# Patient Record
Sex: Male | Born: 1960 | Race: White | Hispanic: No | Marital: Married | Smoking: Former smoker
Health system: Southern US, Community
[De-identification: ages and names within clinical notes are randomized; demographics above are authoritative.]

## PROBLEM LIST (undated history)

## (undated) DIAGNOSIS — J449 Chronic obstructive pulmonary disease, unspecified: Secondary | ICD-10-CM

## (undated) DIAGNOSIS — K922 Gastrointestinal hemorrhage, unspecified: Secondary | ICD-10-CM

## (undated) DIAGNOSIS — G459 Transient cerebral ischemic attack, unspecified: Secondary | ICD-10-CM

## (undated) DIAGNOSIS — E611 Iron deficiency: Secondary | ICD-10-CM

## (undated) DIAGNOSIS — D869 Sarcoidosis, unspecified: Secondary | ICD-10-CM

## (undated) DIAGNOSIS — D649 Anemia, unspecified: Secondary | ICD-10-CM

## (undated) DIAGNOSIS — K219 Gastro-esophageal reflux disease without esophagitis: Secondary | ICD-10-CM

## (undated) DIAGNOSIS — E162 Hypoglycemia, unspecified: Secondary | ICD-10-CM

## (undated) DIAGNOSIS — I513 Intracardiac thrombosis, not elsewhere classified: Secondary | ICD-10-CM

## (undated) HISTORY — PX: SHOULDER SURGERY: SHX246

## (undated) HISTORY — PX: CARPAL TUNNEL RELEASE: SHX101

## (undated) HISTORY — PX: BACK SURGERY: SHX140

## (undated) HISTORY — PX: ULNAR NERVE REPAIR: SHX2594

## (undated) HISTORY — PX: KNEE SURGERY: SHX244

## (undated) HISTORY — PX: ABDOMINAL SURGERY: SHX537

## (undated) HISTORY — PX: HERNIA REPAIR: SHX51

## (undated) HISTORY — PX: CHOLECYSTECTOMY: SHX55

## (undated) HISTORY — PX: SMALL INTESTINE SURGERY: SHX150

## (undated) HISTORY — PX: CERVICAL FUSION: SHX112

## (undated) HISTORY — PX: GASTRECTOMY: SHX58

---

## 1997-12-20 ENCOUNTER — Emergency Department (HOSPITAL_COMMUNITY): Admission: EM | Admit: 1997-12-20 | Discharge: 1997-12-20 | Payer: Self-pay | Admitting: Emergency Medicine

## 1998-03-19 ENCOUNTER — Emergency Department (HOSPITAL_COMMUNITY): Admission: EM | Admit: 1998-03-19 | Discharge: 1998-03-19 | Payer: Self-pay | Admitting: Emergency Medicine

## 1998-08-25 ENCOUNTER — Emergency Department (HOSPITAL_COMMUNITY): Admission: EM | Admit: 1998-08-25 | Discharge: 1998-08-26 | Payer: Self-pay | Admitting: Emergency Medicine

## 1998-11-19 ENCOUNTER — Emergency Department (HOSPITAL_COMMUNITY): Admission: EM | Admit: 1998-11-19 | Discharge: 1998-11-19 | Payer: Self-pay

## 1999-03-25 ENCOUNTER — Encounter: Payer: Self-pay | Admitting: Emergency Medicine

## 1999-03-25 ENCOUNTER — Emergency Department (HOSPITAL_COMMUNITY): Admission: EM | Admit: 1999-03-25 | Discharge: 1999-03-25 | Payer: Self-pay | Admitting: Emergency Medicine

## 1999-04-21 ENCOUNTER — Emergency Department (HOSPITAL_COMMUNITY): Admission: EM | Admit: 1999-04-21 | Discharge: 1999-04-21 | Payer: Self-pay | Admitting: Emergency Medicine

## 1999-04-21 ENCOUNTER — Encounter: Payer: Self-pay | Admitting: Emergency Medicine

## 1999-05-03 ENCOUNTER — Emergency Department (HOSPITAL_COMMUNITY): Admission: EM | Admit: 1999-05-03 | Discharge: 1999-05-03 | Payer: Self-pay | Admitting: Emergency Medicine

## 1999-05-03 ENCOUNTER — Encounter: Payer: Self-pay | Admitting: Emergency Medicine

## 1999-09-07 ENCOUNTER — Emergency Department (HOSPITAL_COMMUNITY): Admission: EM | Admit: 1999-09-07 | Discharge: 1999-09-07 | Payer: Self-pay | Admitting: Emergency Medicine

## 1999-09-07 ENCOUNTER — Encounter: Payer: Self-pay | Admitting: *Deleted

## 1999-11-01 ENCOUNTER — Emergency Department (HOSPITAL_COMMUNITY): Admission: EM | Admit: 1999-11-01 | Discharge: 1999-11-01 | Payer: Self-pay | Admitting: Emergency Medicine

## 1999-11-01 ENCOUNTER — Encounter: Payer: Self-pay | Admitting: Emergency Medicine

## 2000-02-13 ENCOUNTER — Emergency Department (HOSPITAL_COMMUNITY): Admission: EM | Admit: 2000-02-13 | Discharge: 2000-02-13 | Payer: Self-pay | Admitting: Emergency Medicine

## 2000-03-14 ENCOUNTER — Emergency Department (HOSPITAL_COMMUNITY): Admission: EM | Admit: 2000-03-14 | Discharge: 2000-03-14 | Payer: Self-pay | Admitting: Emergency Medicine

## 2000-03-14 ENCOUNTER — Encounter: Payer: Self-pay | Admitting: Emergency Medicine

## 2000-07-11 ENCOUNTER — Emergency Department (HOSPITAL_COMMUNITY): Admission: EM | Admit: 2000-07-11 | Discharge: 2000-07-11 | Payer: Self-pay | Admitting: Emergency Medicine

## 2000-07-11 ENCOUNTER — Encounter: Payer: Self-pay | Admitting: Emergency Medicine

## 2000-08-01 ENCOUNTER — Emergency Department (HOSPITAL_COMMUNITY): Admission: EM | Admit: 2000-08-01 | Discharge: 2000-08-01 | Payer: Self-pay | Admitting: Emergency Medicine

## 2000-08-09 ENCOUNTER — Emergency Department (HOSPITAL_COMMUNITY): Admission: EM | Admit: 2000-08-09 | Discharge: 2000-08-09 | Payer: Self-pay | Admitting: Emergency Medicine

## 2000-08-23 ENCOUNTER — Emergency Department (HOSPITAL_COMMUNITY): Admission: EM | Admit: 2000-08-23 | Discharge: 2000-08-23 | Payer: Self-pay | Admitting: Emergency Medicine

## 2000-08-23 ENCOUNTER — Encounter: Payer: Self-pay | Admitting: Emergency Medicine

## 2000-08-23 ENCOUNTER — Inpatient Hospital Stay (HOSPITAL_COMMUNITY): Admission: RE | Admit: 2000-08-23 | Discharge: 2000-08-25 | Payer: Self-pay | Admitting: Emergency Medicine

## 2000-08-24 ENCOUNTER — Encounter: Payer: Self-pay | Admitting: Pulmonary Disease

## 2000-09-17 ENCOUNTER — Inpatient Hospital Stay (HOSPITAL_COMMUNITY): Admission: EM | Admit: 2000-09-17 | Discharge: 2000-09-21 | Payer: Self-pay | Admitting: *Deleted

## 2000-11-07 ENCOUNTER — Emergency Department (HOSPITAL_COMMUNITY): Admission: EM | Admit: 2000-11-07 | Discharge: 2000-11-07 | Payer: Self-pay | Admitting: Emergency Medicine

## 2001-01-07 ENCOUNTER — Encounter: Payer: Self-pay | Admitting: Emergency Medicine

## 2001-01-07 ENCOUNTER — Emergency Department (HOSPITAL_COMMUNITY): Admission: EM | Admit: 2001-01-07 | Discharge: 2001-01-07 | Payer: Self-pay | Admitting: Emergency Medicine

## 2003-01-31 ENCOUNTER — Inpatient Hospital Stay (HOSPITAL_COMMUNITY): Admission: EM | Admit: 2003-01-31 | Discharge: 2003-02-10 | Payer: Self-pay | Admitting: Psychiatry

## 2003-02-03 ENCOUNTER — Encounter (HOSPITAL_COMMUNITY): Payer: Self-pay | Admitting: Psychiatry

## 2003-03-17 ENCOUNTER — Encounter: Payer: Self-pay | Admitting: Emergency Medicine

## 2003-03-18 ENCOUNTER — Inpatient Hospital Stay (HOSPITAL_COMMUNITY): Admission: AD | Admit: 2003-03-18 | Discharge: 2003-03-23 | Payer: Self-pay | Admitting: Psychiatry

## 2003-06-28 ENCOUNTER — Inpatient Hospital Stay (HOSPITAL_COMMUNITY): Admission: EM | Admit: 2003-06-28 | Discharge: 2003-07-04 | Payer: Self-pay | Admitting: Psychiatry

## 2004-05-31 ENCOUNTER — Emergency Department (HOSPITAL_COMMUNITY): Admission: EM | Admit: 2004-05-31 | Discharge: 2004-06-01 | Payer: Self-pay | Admitting: Emergency Medicine

## 2004-06-16 ENCOUNTER — Ambulatory Visit: Payer: Self-pay | Admitting: Psychiatry

## 2004-06-16 ENCOUNTER — Inpatient Hospital Stay (HOSPITAL_COMMUNITY): Admission: AD | Admit: 2004-06-16 | Discharge: 2004-06-26 | Payer: Self-pay | Admitting: Psychiatry

## 2004-08-16 ENCOUNTER — Emergency Department (HOSPITAL_COMMUNITY): Admission: EM | Admit: 2004-08-16 | Discharge: 2004-08-16 | Payer: Self-pay | Admitting: Emergency Medicine

## 2004-08-20 ENCOUNTER — Ambulatory Visit: Payer: Self-pay | Admitting: Internal Medicine

## 2004-10-22 ENCOUNTER — Ambulatory Visit (HOSPITAL_COMMUNITY): Admission: RE | Admit: 2004-10-22 | Discharge: 2004-10-22 | Payer: Self-pay | Admitting: *Deleted

## 2004-10-22 ENCOUNTER — Encounter (INDEPENDENT_AMBULATORY_CARE_PROVIDER_SITE_OTHER): Payer: Self-pay | Admitting: *Deleted

## 2005-02-08 ENCOUNTER — Emergency Department (HOSPITAL_COMMUNITY): Admission: EM | Admit: 2005-02-08 | Discharge: 2005-02-08 | Payer: Self-pay | Admitting: Emergency Medicine

## 2005-04-21 ENCOUNTER — Emergency Department (HOSPITAL_COMMUNITY): Admission: EM | Admit: 2005-04-21 | Discharge: 2005-04-21 | Payer: Self-pay | Admitting: Emergency Medicine

## 2005-06-06 ENCOUNTER — Ambulatory Visit: Payer: Self-pay | Admitting: Internal Medicine

## 2005-06-13 ENCOUNTER — Ambulatory Visit (HOSPITAL_COMMUNITY): Admission: RE | Admit: 2005-06-13 | Discharge: 2005-06-13 | Payer: Self-pay | Admitting: Gastroenterology

## 2005-06-19 ENCOUNTER — Ambulatory Visit (HOSPITAL_COMMUNITY): Admission: RE | Admit: 2005-06-19 | Discharge: 2005-06-19 | Payer: Self-pay | Admitting: Gastroenterology

## 2005-07-23 ENCOUNTER — Emergency Department (HOSPITAL_COMMUNITY): Admission: EM | Admit: 2005-07-23 | Discharge: 2005-07-23 | Payer: Self-pay | Admitting: Emergency Medicine

## 2005-08-18 ENCOUNTER — Emergency Department (HOSPITAL_COMMUNITY): Admission: EM | Admit: 2005-08-18 | Discharge: 2005-08-18 | Payer: Self-pay | Admitting: Emergency Medicine

## 2005-08-20 ENCOUNTER — Ambulatory Visit (HOSPITAL_COMMUNITY): Admission: RE | Admit: 2005-08-20 | Discharge: 2005-08-20 | Payer: Self-pay | Admitting: Orthopedic Surgery

## 2005-08-20 ENCOUNTER — Ambulatory Visit: Payer: Self-pay | Admitting: Internal Medicine

## 2005-08-28 ENCOUNTER — Encounter: Admission: RE | Admit: 2005-08-28 | Discharge: 2005-08-28 | Payer: Self-pay | Admitting: Orthopedic Surgery

## 2005-09-08 ENCOUNTER — Emergency Department (HOSPITAL_COMMUNITY): Admission: EM | Admit: 2005-09-08 | Discharge: 2005-09-08 | Payer: Self-pay | Admitting: Emergency Medicine

## 2005-09-10 ENCOUNTER — Ambulatory Visit: Payer: Self-pay | Admitting: Internal Medicine

## 2005-09-16 ENCOUNTER — Ambulatory Visit: Payer: Self-pay | Admitting: Cardiology

## 2005-09-17 ENCOUNTER — Encounter: Payer: Self-pay | Admitting: Cardiology

## 2005-09-17 ENCOUNTER — Inpatient Hospital Stay (HOSPITAL_COMMUNITY): Admission: EM | Admit: 2005-09-17 | Discharge: 2005-09-20 | Payer: Self-pay | Admitting: Emergency Medicine

## 2005-09-30 ENCOUNTER — Encounter: Admission: RE | Admit: 2005-09-30 | Discharge: 2005-12-29 | Payer: Self-pay | Admitting: Internal Medicine

## 2005-10-18 ENCOUNTER — Emergency Department (HOSPITAL_COMMUNITY): Admission: EM | Admit: 2005-10-18 | Discharge: 2005-10-18 | Payer: Self-pay | Admitting: Emergency Medicine

## 2006-04-08 ENCOUNTER — Encounter: Admission: RE | Admit: 2006-04-08 | Discharge: 2006-04-08 | Payer: Self-pay | Admitting: Gastroenterology

## 2006-06-20 ENCOUNTER — Encounter: Admission: RE | Admit: 2006-06-20 | Discharge: 2006-06-20 | Payer: Self-pay | Admitting: Neurosurgery

## 2006-07-15 ENCOUNTER — Encounter: Admission: RE | Admit: 2006-07-15 | Discharge: 2006-07-15 | Payer: Self-pay | Admitting: Gastroenterology

## 2006-10-23 ENCOUNTER — Ambulatory Visit: Payer: Self-pay | Admitting: Physical Medicine and Rehabilitation

## 2006-10-23 ENCOUNTER — Encounter
Admission: RE | Admit: 2006-10-23 | Discharge: 2007-01-21 | Payer: Self-pay | Admitting: Physical Medicine and Rehabilitation

## 2006-12-11 ENCOUNTER — Ambulatory Visit: Payer: Self-pay | Admitting: Physical Medicine and Rehabilitation

## 2007-01-20 ENCOUNTER — Encounter: Admission: RE | Admit: 2007-01-20 | Discharge: 2007-04-20 | Payer: Self-pay | Admitting: Anesthesiology

## 2007-01-26 ENCOUNTER — Ambulatory Visit: Payer: Self-pay | Admitting: Anesthesiology

## 2007-02-24 ENCOUNTER — Encounter
Admission: RE | Admit: 2007-02-24 | Discharge: 2007-02-25 | Payer: Self-pay | Admitting: Physical Medicine and Rehabilitation

## 2007-02-24 ENCOUNTER — Ambulatory Visit: Payer: Self-pay | Admitting: Physical Medicine and Rehabilitation

## 2007-03-08 ENCOUNTER — Encounter (INDEPENDENT_AMBULATORY_CARE_PROVIDER_SITE_OTHER): Payer: Self-pay | Admitting: *Deleted

## 2007-03-08 ENCOUNTER — Inpatient Hospital Stay (HOSPITAL_COMMUNITY): Admission: EM | Admit: 2007-03-08 | Discharge: 2007-03-11 | Payer: Self-pay | Admitting: Emergency Medicine

## 2007-03-10 ENCOUNTER — Ambulatory Visit: Payer: Self-pay | Admitting: Vascular Surgery

## 2007-03-30 ENCOUNTER — Emergency Department (HOSPITAL_COMMUNITY): Admission: EM | Admit: 2007-03-30 | Discharge: 2007-03-31 | Payer: Self-pay | Admitting: Emergency Medicine

## 2007-04-20 ENCOUNTER — Ambulatory Visit: Payer: Self-pay | Admitting: Physical Medicine and Rehabilitation

## 2007-10-02 ENCOUNTER — Emergency Department (HOSPITAL_COMMUNITY): Admission: EM | Admit: 2007-10-02 | Discharge: 2007-10-02 | Payer: Self-pay | Admitting: Emergency Medicine

## 2008-06-07 ENCOUNTER — Encounter
Admission: RE | Admit: 2008-06-07 | Discharge: 2008-06-09 | Payer: Self-pay | Admitting: Physical Medicine and Rehabilitation

## 2008-06-09 ENCOUNTER — Ambulatory Visit: Payer: Self-pay | Admitting: Physical Medicine and Rehabilitation

## 2008-06-22 ENCOUNTER — Encounter
Admission: RE | Admit: 2008-06-22 | Discharge: 2008-08-10 | Payer: Self-pay | Admitting: Physical Medicine and Rehabilitation

## 2008-07-21 ENCOUNTER — Ambulatory Visit: Payer: Self-pay | Admitting: Physical Medicine and Rehabilitation

## 2008-08-14 ENCOUNTER — Encounter: Admission: RE | Admit: 2008-08-14 | Discharge: 2008-08-14 | Payer: Self-pay | Admitting: Neurosurgery

## 2009-01-13 IMAGING — CT CT HEAD W/O CM
1 of 2 series · 16 of 30 positions shown, 20 images · IV contrast (agent unspecified)
Comparison: Head CT 09/16/05.

CLINICAL DATA: Loss of vision for 2 days.  Left temporal headache and left leg weakness.  
 HEAD CT WITHOUT CONTRAST:
TECHNIQUE: Contiguous axial images were obtained from the base of the skull through the vertex according to standard protocol without contrast.

[Series 2: head_seq 4.5 h37s st · axial · 0.43mm/px · z∈[-150,-6]mm · 16 of 36 slices shown, 20 images]
[im 2/36  brain]
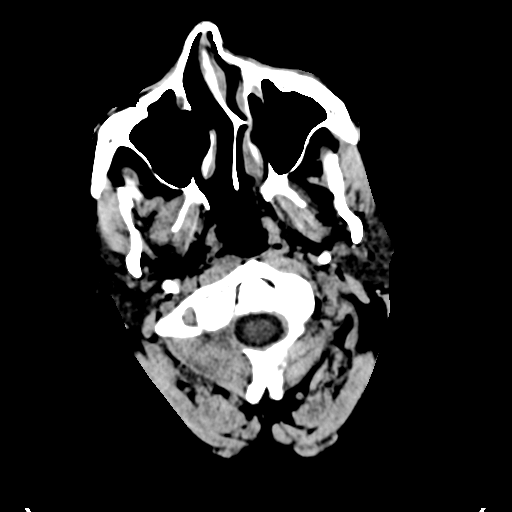
[im 2/36  bone]
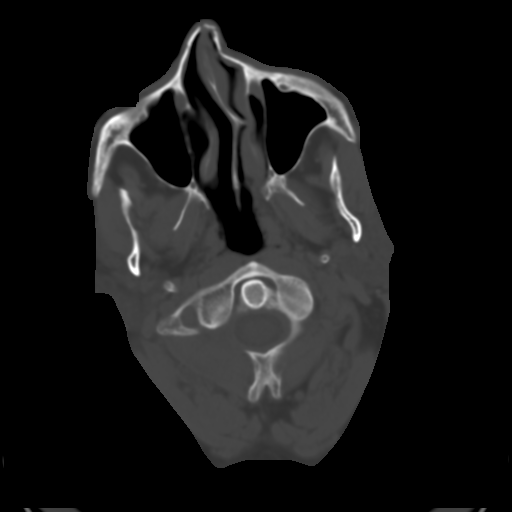
[im 4/36  brain]
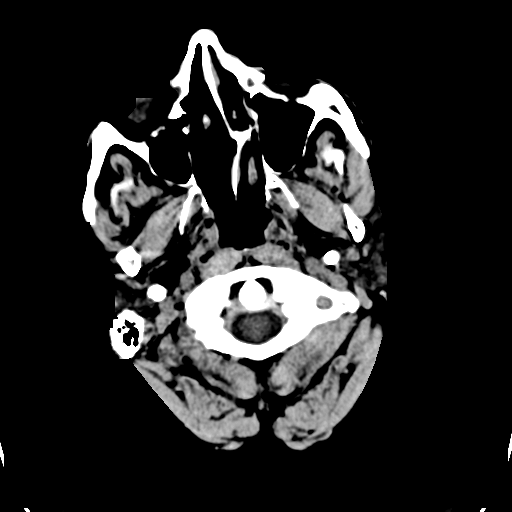
[im 6/36  brain]
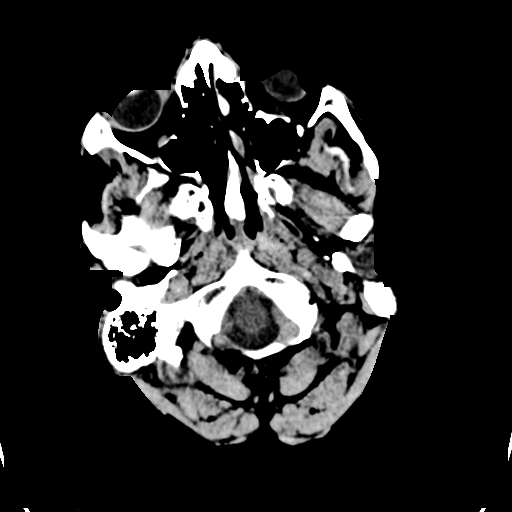
[im 9/36  brain]
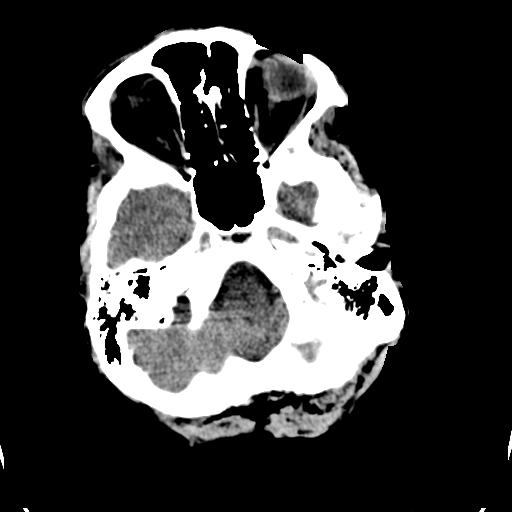
[im 11/36  brain]
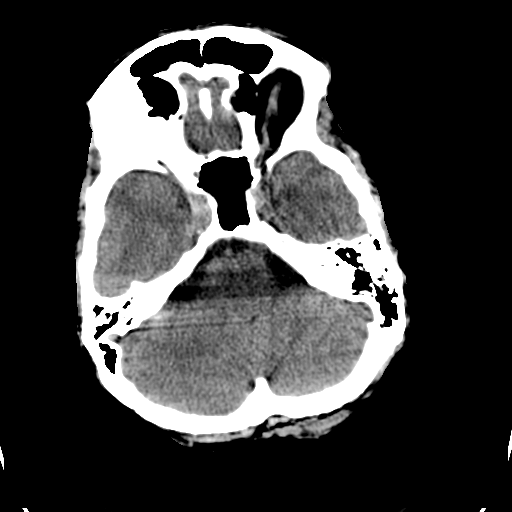
[im 11/36  bone]
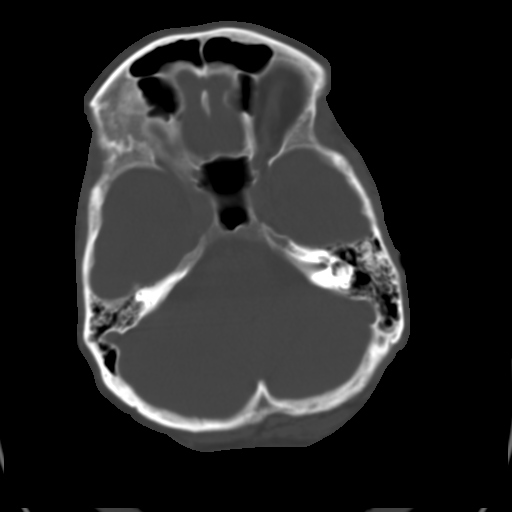
[im 13/36  brain]
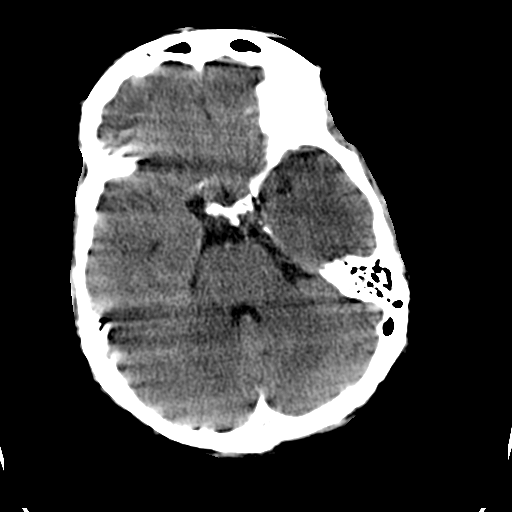
[im 15/36  brain]
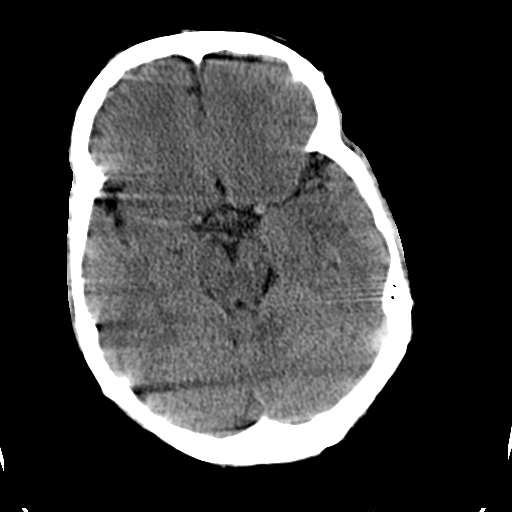
[im 16/36  brain]
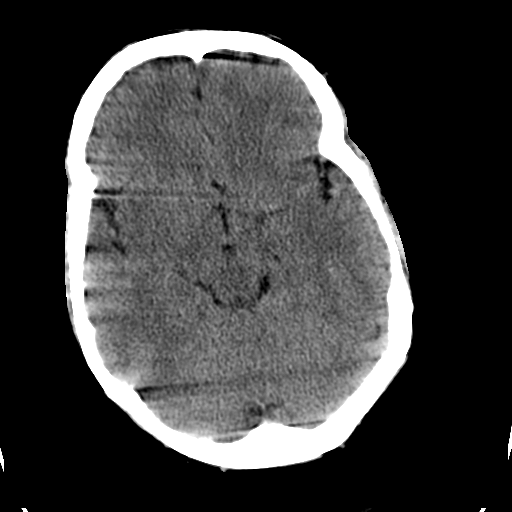
[im 20/36  brain]
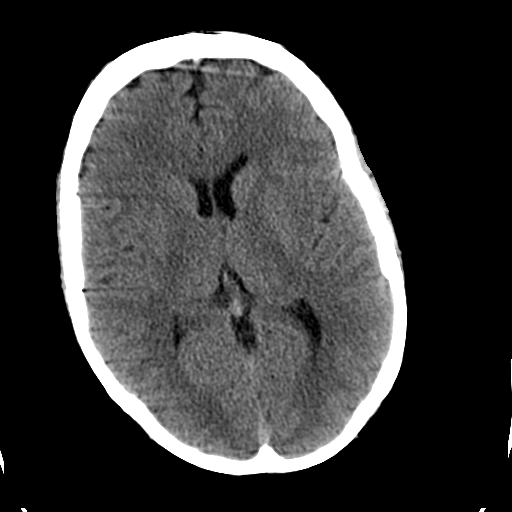
[im 20/36  bone]
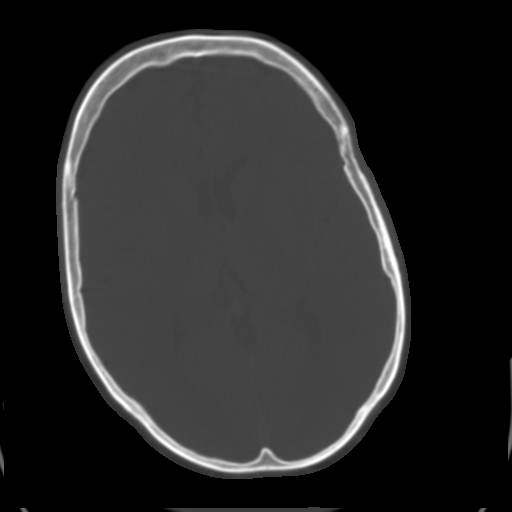
[im 22/36  brain]
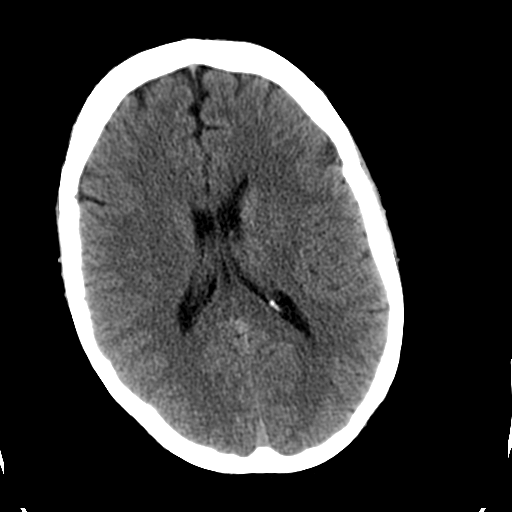
[im 23/36  brain]
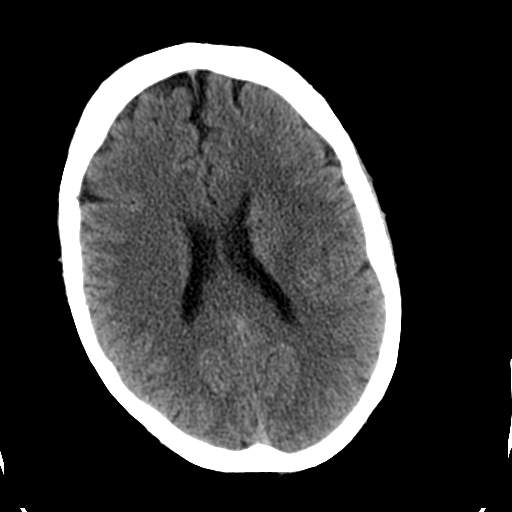
[im 25/36  brain]
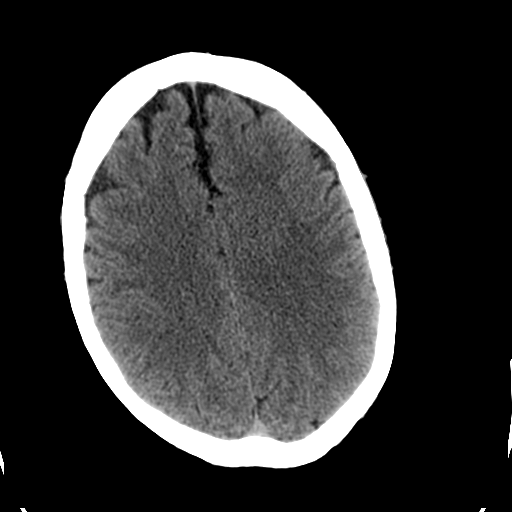
[im 27/36  brain]
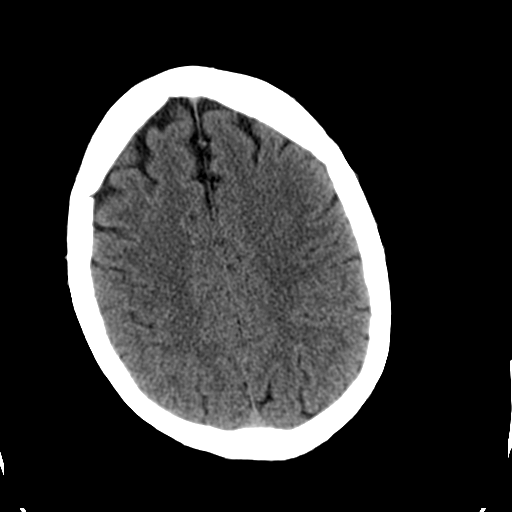
[im 27/36  bone]
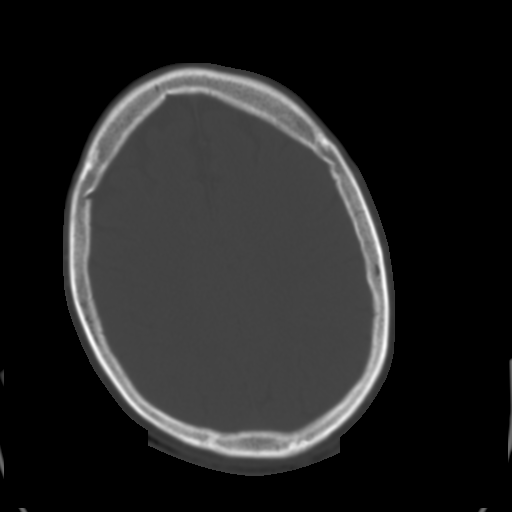
[im 30/36  brain]
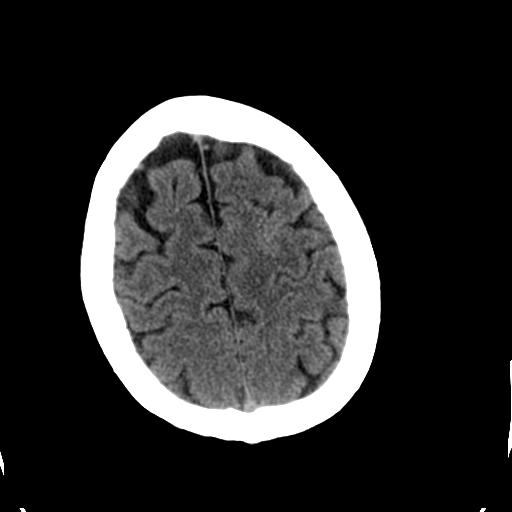
[im 32/36  brain]
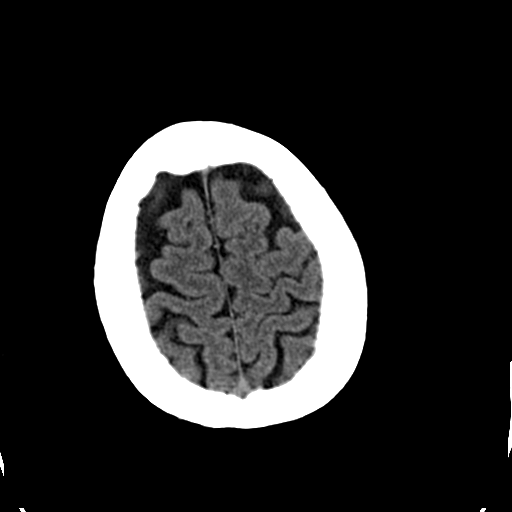
[im 34/36  brain]
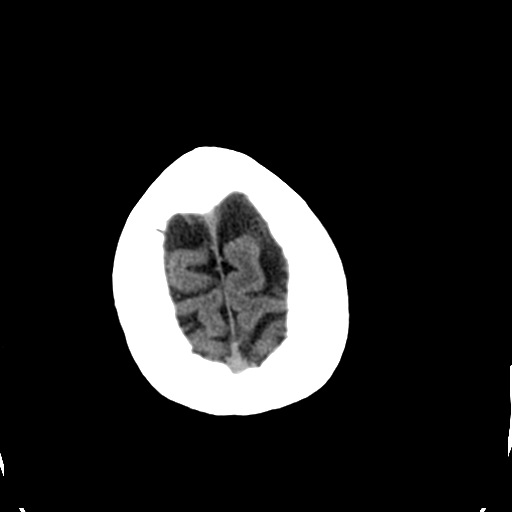

[16 of 30 positions shown; findings below may reference images not displayed]

FINDINGS: Some images were repeated due to motion.  There is no evidence of acute intracranial hemorrhage, mass effect, or extra-axial fluid collection.  The ventricles and subarachnoid spaces appear appropriately sized for age.  No stroke is evident.  There is deformity of the nasal bones suggesting an old fracture.  No acute fractures are visualized.  The visualized paranasal sinuses are now clear.
IMPRESSION: No acute intracranial finding.  There is no CT evidence of acute stroke.  If that remains a clinical concern, follow-up CT or MRI should be considered.

## 2009-06-20 ENCOUNTER — Encounter: Admission: RE | Admit: 2009-06-20 | Discharge: 2009-06-20 | Payer: Self-pay | Admitting: Family Medicine

## 2010-01-21 ENCOUNTER — Encounter: Admission: RE | Admit: 2010-01-21 | Discharge: 2010-01-21 | Payer: Self-pay | Admitting: Neurosurgery

## 2010-07-02 ENCOUNTER — Encounter: Admission: RE | Admit: 2010-07-02 | Discharge: 2010-07-02 | Payer: Self-pay | Admitting: Neurosurgery

## 2010-09-01 ENCOUNTER — Encounter: Payer: Self-pay | Admitting: Gastroenterology

## 2010-12-24 NOTE — Assessment & Plan Note (Signed)
HISTORY:  Mr. Jeffrey Crane is a 50 year old gentleman returning for a recheck.  He has being seen in our pain and rehab clinic for multiple pain  complaints including left knee pain, right ankle pain, cervical lumbar  low back pain, left arm pain with tingling through the dorsum of the  left hand.   He is status post C6-7 fusion 11/2005, Dr. Manson Passey.   CHIEF COMPLAINT:  Cervicalgia and left arm pain.   States his average pain is about 8 on a scale of 10.  He is requesting  cervical injection for this, was told by Dr. Manson Passey to pursue this  apparently.   He recently had his left knee injected and has obtained a knee brace by  Dr. Julius Bowels within the last 2 weeks.   REVIEW OF SYSTEMS:  Positive for coughing.   Past medical, social,  family history otherwise  unchanged.   PHYSICAL EXAMINATION:  VITAL SIGNS:  Blood pressure is 119/76, pulse 90,  respirations 16, a 94% saturated on room air.  GENERAL:  Relative well nourished, well developed male who appears  slightly older than his stated age.  He is oriented x 3.  His affect is  bright, alert, cooperative and pleasant.  His speech is clear.  Follows  commands without difficulty.   Transition from sit to stand without any difficult.  Gait is non-  antalgic.  He has limitation in cervical range of motion with rotation  with left and downward.  Full shoulder range of motion.   He reports numbness into the left upper extremity especially at the  middle ring and little finger on the left although he does report patchy  decrease sensation throughout both upper extremities.   Motor strength however is good in the upper and lower extremities.  No  abnormal tone is noted.  No clonus is noted.   Reflexes are present and 1+ in the upper and lower extremities.   IMPRESSION:  1. Status post cervical fusion C6-7, 11/2005 Dr. Manson Passey with complaints      of cervicalgia and left upper extremity pain and numbness into the      middle ring and little  fingers.  2. Lumbar spondylosis, mild facet arthropathy at multiple levels per      MRI dated 06/20/2006.  3. History of ETOH abuse.  4. History of drug abuse.  5. History of anxiety and depression.   OTHER MEDICAL PROBLEMS:  Include diabetes, chronic obstructive pulmonary  disease, steroid use for chronic obstructive pulmonary disease,  osteopenia, status post gasserectomy 1992.   Patient is using a soft cervical collar for his neck on a p.r.n. basis  although he is not wearing it today.  He has also been on Lyrica and  reports an overall improvement in his pain with 50 mg of Lyrica twice a  day.  He does not need a refill on this today.  He is also using  Lidoderm on a p.r.n. basis.   He is requesting a cervical injection per Dr. Manson Passey.   He reports no problems with the use of Lyrica.  No problems with  sleepiness or drowsiness or trouble walking or balance problems.   He did bring in a myelogram/CT done 09/24/2006 on a disk, we will review  this further.  May consider a followup with Dr. Stevphen Rochester, pain and  anesthesia for injection possible cervical injection.           ______________________________  Brantley Stage, M.D.     DMK/MedQ  D:  01/11/2007 10:11:07  T:  01/11/2007 11:01:37  Job #:  [id number]   cc:   Grant Ruts., D.D.S.  Fax: [phone]

## 2010-12-24 NOTE — Assessment & Plan Note (Signed)
Jeffrey Crane is a 50 year old gentleman returning for recheck and refill of  his medications.  He is accompanied by his wife.  He has multiple pain  complaints, especially complaining of neck and low back pain.  He is  status post a T6-7 fusion April of 2007 by Dr. Manson Passey.   He states that his average pain is about a 9 on a scale of 10.  Sleep  tends to be poor.   Functional status:  Patient able to walk about 30 minutes at a time, he  is independent with his self care.  He admits to some depression/anxiety  and is requesting some medication to assist him with his depression and  to help with pain.   Past medical, social, family history otherwise unchanged.  He follows up  with Dr. Clarene Duke for his general care.   MEDICATIONS PROVIDED BY THIS CLINIC:  1. Lyrica 50 mg two tablets p.o. b.i.d.  2. Lidoderm on a p.r.n. basis.   EXAMINATION:  Blood pressure is 125/61, pulse is 80, respirations 18,  98% saturated on room air.  He is a well-developed, well-nourished  gentleman who appears just slightly older than his stated age.  He is  oriented x3, speech is clear, affect is bright, alert, cooperative, and  pleasant.  Followed commands without difficulty.   Transitions from sit to stand without difficulty.  Tandem gait,  Romberg's tests are performed adequately.  Limitations are noted in  lumbar motion.  Reflexes and motor strength are within normal limits.  Sensory exam is intact.   IMPRESSION:  1. Status post cervical fusion T6-15 November 2005, Dr. Manson Passey.  Patient      has continued to have complaints of cervicalgia, left upper      extremity pain and numbness to the middle and little fingers.  2. Lumbar spondylosis, mild facet arthropathy multiple levels per MRI      dated June 20, 2006.  3. History of EtOH abuse.  4. History of drug abuse.  5. History of anxiety and depression.   OTHER MEDICAL PROBLEMS:  1. Diabetes.  2. Chronic obstructive pulmonary disease.  3. Steroid use for  chronic obstructive pulmonary disease.  4. Osteopenia.  5. Status post gastrectomy 1992 for ulcers.   PLAN:  Will trial patient on Cymbalta starting with 30 mg for the next  14 days, increasing it to 60 mg then will continue Lyrica 50 mg one p.o.  t.i.d. #90 with 2 refills.  Continue to use a soft collar on a p.r.n.  basis.  He uses Biofreeze on a p.r.n. basis as well.  Will trial him  with a TENS unit.   Will see him back in 2 months, nursing visit for refill medications in  one month.          ______________________________  Brantley Stage, M.D.    DMK/MedQ  D:  02/25/2007 13:59:59  T:  02/26/2007 09:41:07  Job #:  [id number]

## 2010-12-24 NOTE — Discharge Summary (Signed)
NAME:  Jeffrey Crane, Jeffrey Crane                 ACCOUNT NO.:  [id number]   MEDICAL RECORD NO.:  [id number]          PATIENT TYPE:  INP   LOCATION:  1439                         FACILITY:  [facility]   PHYSICIAN:  Wilson Singer, M.D.DATE OF BIRTH:  [birth year]   DATE OF ADMISSION:  03/07/2007  DATE OF DISCHARGE:  03/11/2007                               DISCHARGE SUMMARY   FINAL DISCHARGE DIAGNOSIS:  1. Headache with loss of vision, probable migraine headache.  2. Chronic obstructive pulmonary disease.  3. Adrenal insufficiency, on steroids.  4. Diabetes mellitus   CONDITION ON DISCHARGE:  Stable.   MEDICATIONS ON DISCHARGE:  1. Nexium 40 mg daily.  2. Flexeril 10 mg b.i.d..  3. Atrovent and Benylin inhalers.  4. Hydrocortisone 20 mg daily.  5. Spiriva 1 inhalation daily.  6. Advair inhaler 100/50, 1 b.i.d..  7. Lyrica 50 mg t.i.d..  8. Cymbalta 20 mg b.i.d..  9. Depakote 50 mg daily.  10.Aspirin 325 mg daily.  11.Librium 25 mg every h.s..   HISTORY:  This 50 year old man was admitted with sudden episode of loss  of vision bilaterally associated with a headache.  Please see initial  history and physical examination dictated by Michaelyn Barter.   HOSPITAL PROGRESS:  The patient was admitted and he had an MRI of the  brain and this was essentially unremarkable.  Neurology was called to  see him, who felt that this probably was a vascular headache and  represented probable migraine.  There was a suggestion of a carotid  artery dissection but bilateral carotid Dopplers were negative.  The MRI  of the neck showed a filling defect in the right common carotid artery  in its proximal half suspicious for dissection.  As mentioned above, the  bilateral carotid Dopplers were negative.  Echocardiogram was  unremarkable, TSH was within normal limits.  B12 level was normal.  Sed  rate normal/not elevated and ANA was negative.  He always responded to  intravenous Depakote with resolution of headaches.   On the day of  discharge, he was feeling well with significant reduction in his  headaches.   PHYSICAL EXAMINATION:  VITAL SIGNS:  Temperature 97.8, blood pressure  120/76, pulse 81, Saturation 97% on room air. No new neurological  findings.   FURTHER DISPOSITION:  He is to continue with new medications which are  Depakote 500 mg daily as well as Librium 25 mg at night.  He will  continue with he rest of his medications and he has been advised to  followup with his primary care physician and also Dr. Pearlean Brownie,  neurologist, in about 4 weeks' time.      Wilson Singer, M.D.  Electronically Signed     NCG/MEDQ  D:  03/11/2007  T:  03/12/2007  Job:  [id number]   cc:   Caryn Bee L. Little, M.D.  Fax: [phone]

## 2010-12-24 NOTE — H&P (Signed)
NAME:  Jeffrey Crane, Jeffrey Crane NO.:  [id number]   MEDICAL RECORD NO.:  [id number]          PATIENT TYPE:  INP   LOCATION:  0103                         FACILITY:  [facility]   PHYSICIAN:  Michaelyn Barter, M.D. DATE OF BIRTH:  03/18/1961   DATE OF ADMISSION:  03/07/2007  DATE OF DISCHARGE:                              HISTORY & PHYSICAL   PRIMARY CARE PHYSICIAN:  Dr. Clarene Duke, [facility].   CHIEF COMPLAINT:  Sudden loss of vision.   HISTORY OF PRESENT ILLNESS:  Jeffrey Crane is a 50 year old male who is  currently very sleepy.  He arouses, but then drifts suddenly back to  sleep.  Therefore, the history is provided by his wife, Jeffrey Crane.  The  patient's wife indicates that this past Saturday, at approximately 2:30  p.m., the patient had a sudden episode where he lost vision in both of  his eyes.  It took approximately 45 minutes for his vision to return.  He also complained of a headache around the same time.  He later went on  to experience dry heaves prior to the lost of vision.  After his vision  returned, it disappeared again for the second time at approximately 8  p.m., only to return some time later.  The patient's wife indicated in  the past the patient had episodes where his sugars would drop and then  he would lose his vision.  However, this time, his sugars did not drop.  As a matter of fact, the patient's wife indicated that each time his  sugars were greater than 100.  During the second episode of loss of  vision, the patient's CBG was found to be 145.  His blood pressure was  128/88 and heart rate 125.  Like the first time, the patient again  complained of a headache during the second episode of visual loss.  The  patient's wife gave him one to two tablets of Aleve for the headache.  His vision came back approximately 10 minutes later.  The patient's wife  indicates that since the first episode of visual loss, the patient has  experienced approximately 15-16  episodes of visual loss.  Each time, the  patient has a headache and each episode lasts for shorter periods of  time, approximately 2-3 minutes, unlike the first episode.  She states  that she finally decided to bring the patient to the hospital for  further evaluation.  While in the car, the patient became unconscious.  She indicated that she applied some gentle sternal rubs to make sure  that he was okay.  She indicates that when the patient first started  having these visual changes, it started off as him seeing spots.  With  regards to the patient's headaches, she says that anything cold triggers  him to have a headache.  He has never had similar symptoms before other  than when his sugars are low.  However, his sugars are currently normal.  She states the patient was fine before Saturday.  There have been no  fevers or chills.  She states the patient was fine  before Saturday.  There have been no fevers or chills.  He has been diaphoretic and did  have some dry heaves.  The patient drank  eight beers earlier today  prior to his presentation to the hospital.  Since Friday, he has drank  at least six additional beers.   PAST MEDICAL HISTORY:  1. Diabetes mellitus.  2. Adrenal insufficiency leading to the diabetes mellitus.  3. GERD.  4. Chronic pain syndrome.  5. COPD.  6. Low back pain.  7. Peripheral neuropathy.  8. Insomnia.  9. Steroid-induced hyperglycemia.  10.Bone spurs at C6-7.   PAST SURGICAL HISTORY:  1. Disc replaced at C4-5.  2. Left knee arthroscopy completed in January 2008.  3. Eighty-five percent of the patient's stomach was removed secondary      to bleeding ulcers in 1992.  4. Hiatal hernia surgery.  5. Appendectomy.   ALLERGIES:  No known drug allergies.   CURRENT MEDICATIONS:  1. Reglan 10 mg p.o. four times a day.  2. Nexium 40 mg p.o. daily.  3. Hydrocodone 10/650 mg.  4. Tramadol 50 mg.  5. Flexeril 10 mg b.i.d.  6. Albuterol MDI.  7. Albuterol  nebulizer four times a day.  8. Atrovent nebulizer four times a day.  9. Spiriva.  10.Advair.  11.Lyrica 50 mg.  12.Lidoderm patch p.r.n.  13.Hydrocortisone 10 mg daily.  14.NovoLog sliding scale insulin.  15.Trazodone 150 mg nightly p.r.n.  16.Cymbalta.   PHYSICAL EXAMINATION:  GENERAL:  The patient is very sleepy.  He arouses  easily, but falls back to sleep.  VITAL SIGNS:  Temperature 98.6, blood pressure 150/95, heart rate 111,  respirations 20, O2 saturations 95%.  HEENT:  Normocephalic, atraumatic.  Pupils equal round and reactive to  light.  Funduscopic examination of the right eye showed no obvious  retinal hemorrhage.  Funduscopic exam of the left eye showed the pupil  to be constricted and very difficult to get funduscopic examination.  Extraocular movements intact and icteric.  NECK:  No JVD, no lymphadenopathy, no thyromegaly.  CARDIAC:  S1, S2, regular rate and rhythm.  RESPIRATORY:  No crackles or wheezes.  ABDOMEN:  Flat.  Multiple old, well-healed surgical scars are present.  Soft, nontender, nondistended.  Positive bowel sounds.  No organomegaly.  NEUROLOGIC:  The patient is alert and oriented x3.  Cranial nerves 2-12  grossly intact.  There is no facial asymmetry.  Hand/grip strength is  equal bilaterally.  Pronator drift is negative.  No obvious focal motor  deficits with regards to either upper extremities or lower extremities  bilaterally.  MUSCULOSKELETAL:  5/5 upper and lower extremity strength.   LABORATORY DATA AND X-RAY FINDINGS:  White blood cell count 11.9,  hemoglobin 15.3, hematocrit 44.4, platelet count 247,000.  CK-MB 1.-,  troponin less than 0.05.  Sodium 141, potassium 4.1, chloride 106,  carbon dioxide 24, glucose 90, BUN 11, creatinine 0.97, calcium 8.9.  Total protein 6.4, albumin 4, AST 27, ALT 19.  Alcohol level 222.  Erythrocyte sedimentation rate is 2.   CT scan of the patient's head showed no acute intracranial findings,  stable  compared to prior exam.  No CT evidence of acute stroke.   ASSESSMENT/PLAN:  1. Acute onset with sudden loss of vision.  The etiology of this is      questionable.  Differential includes transient ischemic attack      versus migraine headache versus some other unknown etiologic      factor.  Sedimentation rate has been  completed and it is found to      be within normal limits, therefore the likelihood of temporal      arteritis being present is decreased.  Likewise, a CT scan has been      completed and is negative for any acute cerebrovascular accident.      Therefore, will go ahead and check carotid Dopplers as well as a 2-      D echocardiogram as well as RPR and TSH.  May consider consulting      neurology versus ophthalmology.  Will start the patient on aspirin      empirically.  2. Decreased level of responsiveness.  The etiology of this is      questionable.  However, given the fact that the patient has an      alcohol level of greater than 200, I suspect currently the patient      may be suffering from an acute intoxication of alcohol.  It is very      difficult to get a history from the patient secondary to his      current state.  Again, will check a 2-D echocardiogram as well as      EKG for now.  3. Headache.  Again, each time the patient has had episodes of vision      disturbance, there has been the complaint of headache.  Whether or      not this is related to migraine versus transient ischemic attack is      questionable, but will monitor closely for now.  Will consider      consultation with neurology versus ophthalmology.  4. History of diabetes mellitus.  Will initiate Accu-Cheks.  Check a      hemoglobin A1c as well as a fasting lipid profile.  5. History of chronic obstructive pulmonary disease.  This is      compensated.  6. Adrenal insufficiency.  Will verify the dose of the patient's      hydrochlorothiazide and resume this.  7. Gastrointestinal prophylaxis.   Will provide Protonix.  8. Deep venous thrombosis prophylaxis.  Will provide Lovenox.  9. Acute alcohol intoxication.  The patient's wife indicated that over      the past 2 years, the patient has only drank a maximum of 14 beers.      The patient later indicated that he drank 6 beers Friday and 8      beers today.  Will monitor the patient closely now regarding his      history of alcohol consumption.      Michaelyn Barter, M.D.  Electronically Signed     OR/MEDQ  D:  03/08/2007  T:  03/08/2007  Job:  [id number]

## 2010-12-24 NOTE — Procedures (Signed)
NAME:  Jeffrey Crane, Jeffrey Crane NO.:  [id number]   MEDICAL RECORD NO.:  [id number]           PATIENT TYPE:   LOCATION:                                 FACILITY:   PHYSICIAN:  Celene Kras, MD        DATE OF BIRTH:  03-10-1961   DATE OF PROCEDURE:  02/23/2007  DATE OF DISCHARGE:                               OPERATIVE REPORT   1. Elijahjames Fuelling [facility] notably      benefiting from previous cervical facet medial branch intervention.      This is to the left side, and I am going to go ahead and proceed as      a reinforcement, consider radiofrequency neural ablation with      positive predictive experience.  2. Other modifiable features in health profile discussed.  Cigarette      cessation for best outcome.  3. I do not necessarily plan another intervention after this.  I would      like to see how he does.  He has added biomechanical stress above      the surgical fixation site, and I would imagine this is probably      playing into it.  Risks, complications and options are fully      outlined, and we may need either reimage him, or have him see our      surgical colleagues if he does not significantly improve.  Surgical      consultation was recommended to him at some point.  I do not think      that it is a necessarily an emergent to consideration here.  Pain      control is directed toward our best outcome.  4. He is currently not working.  He does have bilateral pain, but I do      not think we need to go too far down the decision tree here.  His      pain is more mechanical than diskogenic.  I do not think nerve      conduction studies would help [place].  Consider muscle stimulator.      Another rationale to perform intervention is to minimize escalation      of controlled substances.   OBJECTIVE:  Diffuse paracervical myofascial, with positive cervical  facetal compression test, particularly left greater than right.  Range  of motion  impaired secondary to pain.  Nothing new neurologically.   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   PLAN:  1. Cervical facet medial branch intervention to the left side at C3,      C4, C5, C6 and C7, with contributory innervation addressed under      local anesthetic, and he is consented.  Predicate further      intervention based on need.  Follow up with our physiatry      colleagues.  2. Review of medication.  3. Review of modifiable features.  4. Consider neurosurgical consult.  He is consented.   The patient  was taken to the fluoroscopy suite and placed in the supine  position.  Neck prepped and draped in the usual fashion.  Under local  anesthetic, I advanced under direct fluoroscopic observation to the  cervical facet at the medial branch of C3, C4, C5, C6 and C7, with  contributory innervation addressed.  Confirmed placement.  I then  injected 0.5 cc of lidocaine 1% MPF at each level, with a total of 40 mg  of Aristocort in divided dose.   He tolerated the procedure well.  No complications from our procedure.  Appropriate recovery.  Discharge instructions given.  No barrier to  communication.  He will be seen in followup.           ______________________________  Celene Kras, MD     HH/MEDQ  D:  02/23/2007 12:23:20  T:  02/24/2007 08:06:53  Job:  [id number]

## 2010-12-24 NOTE — Assessment & Plan Note (Signed)
Jeffrey Crane is a 50 year old married gentleman who was referred by Dr.  Manson Passey for pain management.  Jeffrey Crane has multiple medical problems  including diabetes, renal insufficiency, GERD, COPD, and steroid-induced  hyperglycemia.  He also suffers from history of alcohol abuse, cocaine  abuse, polysubstance abuse in the past and chronic pain issues.   He is back in today and was lasting over a year ago on February 25, 2007.  At that time, he had just undergone medial branch blocks at the cervical  spine by Dr. Stevphen Rochester on February 23, 2007, and January 26, 2007, without  significant improvement.   After that apparently, he underwent another cervical spine surgery by  Dr. Manson Passey and had some improvement in his overall neck pain.  The  cervical spine surgery was about one year ago.  He had C4-C5 fusion back  in 2007 and C3-C4 fusion back in the fall of 2008.   He has also undergone recently right carpal tunnel surgery and right  ulnar surgery and is planning another surgery on the left carpal tunnel  on June 15, 2008, in the next week again by Dr. Manson Passey.   He is also followed by Dr. Julius Bowels who did an arthroscopic surgery on  his right knee.  Apparently, he had a torn meniscus, which was cleaned  up.  I do not have any notes regarding this particular surgery.  This is  for per Jeffrey Crane's history.  He has set up for an MRI of this  knees well.   His chief complaints today are cervical and lumbar pain, which is  intermittent, really depends on his activity level, bilateral knees  bother him, right knee worse than the left.  Pain is about 8 on a scale  of 10, described as aching, stabbing, sharp, and interfering  significantly with activity level.   Pain is fairly constant throughout the day and night.  Sleep tends to be  poor.   He had been on Lyrica 3 times a day.  He is no longer taking this.   He does have Lidoderm, which he induces intermittently.   FUNCTIONAL STATUS:  He is able to  walk 10-15 minutes at a time.  He is  able to climb stairs and drive.  He is independent with self care, also  helps out cleaning the house.  He enjoys playing on the computer as  well.   REVIEW OF SYSTEMS:  The patient denies problems with controlling bowel  or bladder, but occasionally has some numbness, tingling, and weakness  in the upper extremities and right lower extremities.  Denies confusion,  depression, anxiety, or suicidal ideations at this time.   He admits with some shortness of breath and continues to smoke about  half pack cigarettes a day.  He states he recently had a swine flu,  which made him quit sick a month or two ago.   Physicians currently involved in his care include Randle Shatzer who is  over at [facility], Gwendlyn Deutscher, neurosurgeon, and Heloise Beecham, his orthopedist.   PAST MEDICAL HISTORY:  Significant for diabetes mellitus, renal  insufficiency, gastroesophageal reflux, chronic pain syndrome, COPD,  peripheral neuropathy, insomnia, steroid-induced hyperglycemia, history  of alcohol abuse, polysubstance abuse, suicidal ideations, and  sarcoidosis.   PAST SURGICAL HISTORY:  Positive for bleeding ulcer in 1992 with a  partial gastrectomy, appendectomy, cervical spine surgery x2 by Dr.  Gwendlyn Deutscher, lumbar surgery in 2008 by Dr. Manson Passey, right knee  arthroscopic surgery by Dr. Julius Bowels, and left knee arthroscopic surgery  in 2009 by Dr. Julius Bowels.   SOCIAL HISTORY:  The patient is married.  Lives with his wife.  He has a  history of DUI and polysubstance abuse and alcohol abuse.  Continue to  smoke half pack of cigarettes a day, caution against this.   FAMILY HISTORY:  No changes in his family history.  History of diabetes  is noted.   MEDICATIONS:  He comes in on today include the following, Advair,  Reglan, Flexeril, Hydrocet, trazodone, Nexium, Spiriva, albuterol, and  Atrovent.   PHYSICAL EXAMINATION:  VITAL SIGNS:  Blood pressure is  124/81, pulse 73,  respirations 18, and 98% saturation on room air.  GENERAL:  He is well-developed, well-nourished gentleman who appears a  bit older than his stated age.  He is oriented x3.  Speech is clear.  Affect is bright.  He is alert, cooperative, and pleasant, and he  follows commands without difficulty.   Cranial nerves grossly intact.  Coordination is intact.  Reflexes are 2+  in the upper extremities at biceps, triceps, and brachioradialis, 2+ in  patellar, and Achilles tendons are zero bilaterally.  Light touch motor  strength is 5/5 in upper and lower extremities about obvious focal  weakness.  He has fair range of motion in cervical spine.  Full shoulder  range motion is noted as well.  He has minimal limitations in lumbar  motion with forward flexion, slightly flattened back in the lumbar  spine.   Gait is nonantalgic today with normal stride length.  Balance is quite  good.   IMPRESSION:  1. Status post cervical spine surgery x2 by Dr. Gwendlyn Deutscher.  2. History of lumbar surgery in 2008.  3. Bilateral knee osteoarthritis, status post arthroscopic surgery in      2008 and 2009 with chronic knee pain.  4. Lumbago.  5. Cervicalgia.  6. History of chronic pain.  7. History of EtOH abuse.  8. History of drug abuse.  9. History of anxiety/depression.   PLAN:  We will get Jeffrey Crane setup for some physical therapy, education,  appropriate body mechanics for cervical and lumbar spine.  TENS unit  trial.  He does have a soft collar at home.  He had a lumbar support at  home, which he can use on a p.r.n. basis.  I have also given him some  samples of Biofreeze.   Apparently, he does have a surgery coming up by Dr. Manson Passey for left  carpal tunnel on June 15, 2008.  I will see him back in 6 weeks.           ______________________________  Brantley Stage, M.D.     DMK/MedQ  D:  06/09/2008 10:13:48  T:  06/10/2008 00:11:17  Job #:  [id number]

## 2010-12-24 NOTE — Procedures (Signed)
NAME:  Jeffrey Crane NO.:  [id number]   MEDICAL RECORD NO.:  [id number]          PATIENT TYPE:  REC   LOCATION:  TPC                          FACILITY:  [facility]   PHYSICIAN:  Celene Kras, MD        DATE OF BIRTH:  16-Oct-1960   DATE OF PROCEDURE:  01/26/2007  DATE OF DISCHARGE:                               OPERATIVE REPORT   [facility]  I evaluated  and reviewed the history form and 14 point review of systems.  He is  complaining of lateralizing pain to the left side, functional impairment  secondary to pain.  An individual who has extensive degenerative disease  of the cervical spine with ACDF plate.  We plan today to go on to  cervical facet medial branch intervention to minimize escalation of  controlled substances and improve function and quality of life.   1. Modifiable features in health profile such as cigarette cessation      is strongly urged.   1. We plan to go on the most problematic side.  I do not believe a      bilateral intervention is warranted at this time.  He is an      individual who has had many struggles and we want to avoid      controlled substances if possible, and we are going to go ahead and      proceed with the left side.  The risks, complications and options      are fully outlined and he is consented.  I am going to go to C4, C5      and C6 with contributory innervation addressed, and predicate      further intervention based on need. He is consented for this      procedure and will follow up with him in about 3-4 weeks.   Physical exam reveals a pleasant male seen comfortably in bed.  Gait,  affect, and appearance normal.  Alert and oriented x3.  He has diffuse  suprascapular paracervical myofascial, positive cervical compression  test left greater than right.  Range of motion impaired secondary to  pain.  Nothing new neurologically.   IMPRESSION:  Cervical facet syndrome,  degenerative disc disease cervical  spine.   PLAN:  1. Cervical facet medial branch intervention to the left side C4, C5,      C6 and C7. Contributory innervation addressed under local      anesthetic.  Predicate further intervention based on need.  2. Modifiable features in the health profile discussed.  3. Maintain contact with our colleagues, rehab, for the biomechanical      model.  He is consented.   The patient was taken to the fluoroscopy suite and placed in the supine  position.  After prep and drape in the usual fashion, I used a 25 gauge  needle and advanced to the cervical facet medial branch on the left side  C4, C5, C6 and C7, contributory innervation addressed.  Confirmed  placement.  I then inject 0.5 mL  lidocaine 1% MPF at each level with  total of 40 mg Aristocort in divided dose.   He tolerated the procedure well.  No complications from the procedure.  Appropriate recovery.  Discharge instructions given.  No barrier to  communication.  I will see him in follow-up.  Improved.           ______________________________  Celene Kras, MD     HH/MEDQ  D:  01/26/2007 13:33:49  T:  01/26/2007 04:54:09  Job:  [id number]

## 2010-12-24 NOTE — Assessment & Plan Note (Signed)
Jeffrey Crane is a 50 year old married gentleman who is accompanied by his  wife today.  He is a patient of Dr. Gwendlyn Deutscher and Dr. Clarene Duke.  Mr.  Hockey has multiple medical problems including diabetes, history of renal  insufficiency, GERD, COPD, and steroid-induced hyperglycemia.  He also  suffers from a history of alcohol abuse, cocaine abuse, polysubstance  abuse in the past, and chronic pain issues.   He was last seen by me in June 09, 2008.  In the interim, he reports  no new medical problems.  He has had bilateral carpal tunnel surgery;  right was done in October 2009, left was done in November 2009; both by  Dr. Gwendlyn Deutscher.   He reports overall improvement in the hand numbness and tingling with  these surgeries.   Complains today mainly of cervicalgia and some mid thoracic back pain.   Average pain is about an 8 on a scale of 10, described as sharp,  burning, dull, stabbing, tingling, and aching; fairly constant in  nature.  He has had no change in the quality or intensity of the pain in  the last 6-8 months; it has been fairly constant without exacerbation.   Interferes significantly with activities, sleep tends to be poor.   Pain is worse when he is active, walking, bending, sitting, and  standing; improves with medication.   MEDICATIONS:  Provided through this clinic none at this time.  He has  been trialed on Lidoderm patches, Lyrica 50 mg t.i.d., and Cymbalta 60  mg daily.  He has discontinued all of these medications.   Functional status is as follows.  He is able to walk 10 minutes or less.  He is able to climb stairs and drive.  He is independent with self-care.  He has been disabled since 68.   Denies problems controlling bowel or bladder.  His numbness in the hand  started since his surgery.  Denies depression, anxiety, or suicidal  ideations.   REVIEW OF SYSTEMS:  Otherwise negative.   SOCIAL HISTORY:  The patient continues to live with wife and 2  sons, 71  and 71 year old in the home; denies illegal substance use; denies  alcohol; smokes a pack of cigarettes a day.   PHYSICAL EXAMINATION:  VITAL SIGNS:  Blood pressure 144/88, pulse 54,  respiration 18, and 98% saturated on room air.  GENERAL:  Jeffrey Crane is a well-developed, well-nourished gentleman who  does not appear in any distress.  He is smiling and appropriate this  morning.  He is oriented x3.  Speech is clear.  Affect is bright.  He is  alert, cooperative, and pleasant.   He answers questions appropriately and follows commands without  difficulty.   Cranial nerves and coordination are grossly intact.  Reflexes are 1+ in  the upper extremities, 2+ at bilateral knees, and 0 at the ankles.  No  abnormal tone is noted.  No clonus is noted.  No tremors are noted.   Denies sensory deficits in the upper and lower extremities to light  touch today.  Motor strength is quite good in both upper and lower  extremities without obvious focal deficits.  Straight leg raise is  negative.   Mild limitations noted in the cervical range of motion in all planes  also mild limitations noted in the lumbar range of motion as well.  Hip  range of motion is within normal limits.  Shoulder range of motion is  within normal limits bilaterally as well.  Gait is nonantalgic with a normal stride length, balance is good.  Tandem gait and Romberg tests are all performed adequately.   IMPRESSION:  1. Status post cervical spine surgery x2, Dr. Gwendlyn Deutscher.  2. History of lumbar surgery 2008.  3. Bilateral knee osteoarthritis, status post arthroscopic surgery      2008 and 2009 with chronic knee pain.  4. Lumbago.  5. Cervicalgia.  6. History of chronic pain.  7. History of EtOH abuse.  8. History of drug abuse.  9. History of anxiety and depression.   PLAN:  We will give him a prescription for Flexeril 10 mg 1 p.o. at  bedtime p.r.n.  He has been off Lyrica and Cymbalta now for many  months.  He is not interested in these 2 medications, does not find that they  helped.  He has found that the lidocaine does not help that much either.  He has been trialed on TENS unit, states that this does not help as  well.  He is requesting some narcotic pain medication.  Given his  history, we will decline this request currently.   We will see him back in 3 months.  I asked him to maintain contact with  his primary care physician for his other medical problems.           ______________________________  Brantley Stage, M.D.     DMK/MedQ  D:  07/21/2008 09:50:10  T:  07/21/2008 16:10:96  Job #:  [id number]

## 2010-12-24 NOTE — Consult Note (Signed)
NAME:  Jeffrey Crane, Jeffrey Crane                 ACCOUNT NO.:  [id number]   MEDICAL RECORD NO.:  [id number]          PATIENT TYPE:  INP   LOCATION:  1439                         FACILITY:  [facility]   PHYSICIAN:  Pramod P. Pearlean Brownie, MD    DATE OF BIRTH:  11-03-1960   DATE OF CONSULTATION:  DATE OF DISCHARGE:                                 CONSULTATION   REFERRING PHYSICIAN:  In [facility] Team.   REASON FOR REFERRAL:  Headache and vision loss.   HISTORY OF PRESENT ILLNESS:  Jeffrey Crane is a 50 year old Caucasian male  who presented for evaluation for a 1-week history of dizzy headaches as  well as transient vision loss episodes.  The patient states he has been  having headaches every day for the last 1 week and describes them as  bitemporal, sharp headaches, which last for a variable period of time  from 7 minutes to a few hours.  Headaches are accompanied with some  nausea and light sensitivity but no sound sensitivity.  Headaches are  disabling, 10 out of 10.  He needs to lie down.  In the past he has had  similar headaches of around once or twice a month but without visual  disturbances.  He used to take Aleve which worked quite well.  For the  last 2 days he has had several episodes, at least 4-5 of transient  vision loss, describes them as becoming blind in both eyes all of a  sudden, seeing everything dark, lasting for a variable period of time  from 3 minutes to a maximum of 30 minutes.  He had 2-3 episodes  yesterday.  He states in the past he has had similar episodes when his  sugars had been quite low but in the present episodes his sugars were  normal to high range.  On one of the episodes he checked his blood  glucose to be 145.  He admits to having drank about 14 beers over the  weekend and these episodes.  He denies any nausea, vomiting, vertigo,  speech problems, gait or balance problems.  He was seen in the emergency  room with an alcohol level of 200.  He has had no further episodes  since  he has been in the hospital.   PAST MEDICAL HISTORY:  Significant for:  1. Diabetes.  2. Renal insufficiency.  3. Gastroesophageal reflux disease.  4. Chronic pain syndrome.  5. COPD.  6. Low back pain.  7. Peripheral neuropathy.  8. Insomnia.  9. Steroid-induced hyperglycemia.   PAST SURGICAL HISTORY:  1. Cervical disk surgery C4-5.  2. Left knee arthroscopy.  3. Gastrectomy.  4. Appendectomy.   MEDICATION LIST:  Aspirin, albuterol, Effexor, Flexeril, Cymbalta,  Lovenox, Advair, hydrocortisone, Atrovent, Reglan, insulin, Protonix,  Lyrica, Spiriva.   MEDICATION ALLERGIES:  1. TYLENOL CAUSES RASH.  2. NSAIDS CAUSE BLEEDING ULCER.   REVIEW OF SYSTEMS:  Positive for generalized weakness, headache, vision  loss.   PHYSICAL EXAMINATION:  GENERAL:  Reveals a frail middle-aged Caucasian  male who at present is not in distress.  VITAL SIGNS:  Afebrile.  Temperature 98.1, pulse rate 69 per minute  regular, respiratory rate 18 per minute , blood pressure 114/70, sat is  95% on room air.  His CBG today is 124.  EXTREMITIES:  Distal pulses are well felt.  HEAD:  Nontraumatic.  NECK:  Supple without bruit.  ENT:  Unremarkable.  CARDIAC:  No murmur or gallop.  LUNGS:  Clear to auscultation.  ABDOMEN:  Soft, nontender.  NEUROLOGIC:  He is pleasant, awake, alert, cooperative, with no aphasia,  apraxia, or dysarthria.  Pupils are equal, reactive.  There is no  afferent pupillary defect.  Visual acuity and fields accurate. .  Funduscopic exam reveals sharp disk margins without papilledema.  There  is a small papillary hemorrhage noted in the right eye.  Face is  symmetric.  Palatal movements are normal.  Tongue is midline.  Motor  system exam reveals no upper extremity drift.  Symmetric strength, tone,  reflexes, coordination, sensation.  He has depressed ankle jerks and  some impaired vibration of the toes but he walks with a fairly steady  gait.  He is in fact able to  stand on either foot unsupported with eyes  open and closed and even walk tandem.   DATA REVIEW:  Non-contrast CAT scan of the head done today reveals no  acute abnormality.   LABORATORY:  WBC count is normal.   IMPRESSION:  A 50 year old gentleman with a 1-week history of daily  recurrent headaches as well as transient vision loss of unclear  significance.  Possibilities include complicated migraine episodes  versus alcohol related vision loss spells.  Given his prior history of  gastrectomy, certainly B12 and folate deficiencies also need to be  checked  for.   PLAN:  1. I would recommend treating his headaches with IV Depacon 750 mg x1      now, followed by Depakote ER 500 mg a day for headache prophylaxis.  2. Check MRI scan of the brain and MRA of the brain to rule out      __________ or vascular lesions.  3. Check ESR, ANA, B12, folate, and methylmalonic acid levels.  4. The patient should cut back his alcohol intake and smoking.   I would be happy to follow the patient in consult, kindly call for  questions.           ______________________________  Sunny Schlein. Pearlean Brownie, MD     PPS/MEDQ  D:  03/08/2007  T:  03/08/2007  Job:  [id number]

## 2010-12-27 NOTE — Discharge Summary (Signed)
NAME:  Jeffrey Crane, SCHWANDT NO.:  [id number]   MEDICAL RECORD NO.:  [id number]                   PATIENT TYPE:  IPS   LOCATION:  0304                                 [facility]ACILITY:  [facility]   PHYSICIAN:  Jeanice Lim, M.D.              DATE OF BIRTH:  1961/02/28   DATE OF ADMISSION:  06/28/2003  DATE OF DISCHARGE:  07/04/2003                                 DISCHARGE SUMMARY   IDENTIFYING DATA:  This is a 50 year old Caucasian male voluntarily  admitted.  Fourth admission to [facility].  Admitted from  [place].  Presented with decreased sleep.  Took an overdose of  Lexapro, Seroquel.  Had been depressed for three weeks with suicidal  ideation.  Drinking a case of beer daily for two weeks.  Longest period of  sobriety was one year in 2002.  Use of cocaine a week ago and cannabis two  days ago.  The patient had been on no medications for depression for a  month.   MEDICATIONS:  Combivent inhaler, Seroquel, Lexapro 15 mg daily in the past  and Wellbutrin XL 300 mg daily in the past.   ALLERGIES:  No known drug allergies.   PHYSICAL EXAMINATION:  Essentially within normal limits.  Neurologically  nonfocal.   LABORATORY DATA:  Routine admission labs within normal limits.   MENTAL STATUS EXAM:  Fully alert, cooperative, depressed, sad affect.  Ashamed of drinking.  Mood depressed, guilty, helpless, hopeless.  Thought  process goal directed.  Positive passive suicidal ideation.  No psychotic  symptoms.  Cognitively intact.  Feeling unable to control substance abuse.  Helpless regarding addiction.   ADMISSION DATE:   AXIS I:  1. Major depressive disorder, recurrent, severe.  2. Alcohol dependence.  3. Polysubstance abuse.   AXIS II:  Deferred.   AXIS III:  1. 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  2. Emphysema.   AXIS IV:  Moderate (problems with primary support group, medical problems  and problems related to  addiction and depression).   AXIS V:  25/55-60.   HOSPITAL COURSE:  The patient was admitted and ordered routine p.r.n.  medications and placed in dual-diagnosis program.  Encouraged to participate  in individual, group and milieu therapy.  Was monitored for safety and  restabilized on medications.  The patient reported a positive response to  crisis intervention and medication changes.  Reported a decrease in  withdrawal symptoms and gradually improved with crisis interventions.   CONDITION ON DISCHARGE:  Markedly improved.  Mood more euthymic.  Affect  brighter.  Thought processes goal directed.  Thought content negative for  dangerous ideation or psychotic symptoms.   DISCHARGE MEDICATIONS:  1. Protonix 40 mg q.a.m.  2. Combivent inhaler 2 puffs four times a day.  3. Lexapro 10 mg q.a.m.  4. Seroquel 200 mg q.h.s.   FOLLOW UP:  The patient was to  follow up on Wednesday, July 05, 2003  with Dr. Lang Snow and at [facility]  on July 04, 2003 at 2 p.m.   DISCHARGE DIAGNOSES:   AXIS I:  1. Major depressive disorder, recurrent, severe.  2. Alcohol dependence.  3. Polysubstance abuse.   AXIS II:  Deferred.   AXIS III:  1. 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  2. Emphysema.   AXIS IV:  Moderate (problems with primary support group, medical problems  and problems related to addiction and depression).   AXIS V:  Global Assessment of Functioning on discharge 55.                                               Jeanice Lim, M.D.    JEM/MEDQ  D:  07/27/2003  T:  07/27/2003  Job:  [id number]

## 2010-12-27 NOTE — H&P (Signed)
NAME:  Jeffrey Crane, Jeffrey Crane NO.:  [id number]   MEDICAL RECORD NO.:  [id number]                   PATIENT TYPE:  IPS   LOCATION:  0304                                 FACILITY:  [facility]   PHYSICIAN:  Geoffery Lyons, M.D.                   DATE OF BIRTH:  November 05, 1960   DATE OF ADMISSION:  06/28/2003  DATE OF DISCHARGE:                         PSYCHIATRIC ADMISSION ASSESSMENT   DATE OF ASSESSMENT:  June 28, 2003   PATIENT IDENTIFICATION:  This is a 50 year old divorced white male who is a  voluntary admission.   HISTORY OF PRESENT ILLNESS:  This is the fourth admission to [facility] for this 50 year old white male from [place], [place], who presented in the emergency room on November 16  after taking a bunch, approximately 10-20 tablets each of Lexapro,  Seroquel, and Wellbutrin on November 15.  His urine drug screen was negative  and his alcohol level was at 188.  He reported he had been drinking about  one case of beer daily for the past two weeks, had a history of alcohol  abuse and his longest period clean and sober had been approximately one year  in 2002.  He also endorsed abuse of cocaine with his last use approximately  one week ago and he also used marijuana approximately two days ago.  He  denied any suicidal ideation today, homicidal ideation, or auditory and  visual hallucinations.  He reported he has had no medications in about one  month and he stated that he stopped them because of financial problems  obtaining the medications.  He endorsed depressed mood for the past three  weeks with suicidal ideation when under the influence of alcohol and he  reported his sleep had been decreased to about two hours a night with  frequent awakenings.  In the emergency room, he had endorsed suicidal  ideation, stating he had no reason to live and that he had been quite  depressed, stating, I have no life since my dad  died.  His father had been  deceased several months.   PAST PSYCHIATRIC HISTORY:  The patient is followed at [facility] but has not been attending appointments.  This is his fourth  admission to [facility] with his last one being in  September 2004.  He states he went home and finished his medications but  then did not keep appointments or continue the medications as prescribed.  This is a typical presentation for this patient.   SUBSTANCE ABUSE HISTORY:  Please see the above.  The patient has a history  of alcohol and cocaine abuse.   PAST MEDICAL HISTORY:  The patient is followed at [facility] for primary  care needs.  He attends there irregularly.  Medical problems include asthma  and emphysema.  Past medical history is remarkable  for a hiatal hernia,  gastrectomy secondary to GERD and a history of pyloric ulcer disease,  previous hernia repair, and a questionable past diagnosis of sarcoidosis.  The patient has no history of seizures.   MEDICATIONS:  It is noted that the patient has had no medications in the  past month but typically takes:  1. Combivent inhaler two puffs q.i.d.  2. He was previously discharged on Seroquel 100 mg p.o. q.h.s.  3. Lexapro 15 mg daily.  4. Wellbutrin XL 300 mg p.o. q.a.m.   DRUG ALLERGIES:  None.   PHYSICAL EXAMINATION:  GENERAL:  Please see the physical examination done by  Dr. Jed Limerick in the [facility] Emergency Room.  We note no real significant  differences at this time.  This is a generally well nourished, well  developed, small built male.  The patient does wear corrective lenses.  VITAL SIGNS:  Vital signs were within normal limits at the time of admission  including a pulse of 98 and blood pressure 135/85.  He was afebrile.  He was  5 feet 7 inches tall, weighed 176 pounds with a BMI calculated at  approximately 28.  LUNGS:  Lung sounds revealed rare scattered wheezes with few coarse   bronchial rattles.  HEART:  Heart rate was regular with S1 and S2 heard.  EXTREMITIES:  Extremities were noted as pink and warm, no signs of edema.  NEUROLOGIC:  Generally normal motor exam with no signs of tremor.   LABORATORY DATA:  Alcohol level was 188 in the emergency room.  Urine drug  screen was negative.  Pulse oximetry 98%.  Basic metabolic panel and liver  panel were within normal limits.  He did receive thiamine and folate in the  emergency room along with IV fluids.   SOCIAL HISTORY:  This patient is disabled.  He is divorced, no children.  He  is on both Medicare and Medicaid, lives at home with his mother and brother.  No current legal problems.   FAMILY HISTORY:  The patient denies any history of mental illness or  substance abuse.   MENTAL STATUS EXAM:  This is a fully alert male who is cooperative with a  depressed and sad affect.  He is ashamed of his drinking, able to articulate  his concerns about his depression with his father quite well.  Speech is  normal in pace and tone.  Mood is depressed, ashamed, and helpless.  Thought  process is positive for some passive suicidal ideation, feeling hopeless and  helpless, no homicidal ideation, no clear psychosis.  Cognitive: Intact and  oriented x 3.  Thought content is remarkable for somewhat passive approach  to his substance abuse, acting somewhat helpless in the face of it, stating,  It just happens.  Intelligence is average.  Insight is adequate.  Impulse  control and judgment: Within normal limits.   ADMISSION DIAGNOSES:   AXIS I:  1. Major depression, recurrent, severe.  2. Alcohol abuse and dependence.  3. Polysubstance abuse.   AXIS II:  Deferred.   AXIS III:  1. Status post gastrectomy secondary to pyloric ulcer disease.  2. Emphysema.   AXIS IV:  Moderate problems with increased idle time but adequate support  from his mother with whom he lives is an asset to him.  AXIS V:  Current 29, past year  28.   INITIAL PLAN OF CARE:  Plan is to voluntarily admit the patient to detoxify  him from alcohol.  We have placed  him on a Librium detoxification protocol  and will check a Burn's score and a CIWA on him.  We have placed him in a  dual diagnosis programming and he is participating in intensive group and  individual psychotherapy appropriately.  We are going to restart him on his  Lexapro 10 mg daily along with Seroquel at h.s. and will continue to  evaluate his need for the Wellbutrin.  We are also placing him on Protonix  40 mg daily for some GI protection and his  Combivent inhaler, two puffs q.i.d.  We have discussed the plan with the  patient.  He has asked some appropriate questions and has voiced his  agreement with the plan.   ESTIMATED LENGTH OF STAY:  Five days.     Margaret A. Scott, N.P.                   Geoffery Lyons, M.D.    MAS/MEDQ  D:  07/03/2003  T:  07/03/2003  Job:  [id number]

## 2010-12-27 NOTE — Discharge Summary (Signed)
[facility]  Patient:    Jeffrey Crane, Jeffrey Crane                        MRN: [id number] Adm. Date:  [id number] Disc. Date: [id number] Attending:  Devoria Albe Dictator:   Young Berry. Lorin Picket, R.N., F.N.P.                           Discharge Summary  IDENTIFYING INFORMATION:  This is a 50 year old separated white male admitted on an voluntary basis with a chief complaint that he was here for "alcohol, drugs, and suicide."  HISTORY OF PRESENT ILLNESS:  The patient presented to the [facility] emergency Department.  Initially at that time he was feeling depressed with severe thoughts of suicide including a plan to either jump off a bridge or hang himself and reporting feeling extremely suicidal.  The patient stated he wanted to straighten out his life and had been drinking a 12 pack of beer prior to going to [facility].  He has been drinking nonstop for at least a year anywhere from a 12 pack of beer up to a case and a half every day for the last year with his last drink being one day prior to presenting in the emergency department.  He reported no history of DTs, no history of seizures, but did have a history of blackouts.  Also reported that his sleep was poor, averaging about two hours a night for the previous six years with frequent awakening. He does state that trazodone did assist him with sleep.  Appetite: Reportedly poor with a weight loss of 15 pounds in two weeks.  The patient also reported using crack cocaine about $200 worth every weekend for the last six months with his last use two weeks prior to presentation.  The patient feels that he has been depressed since his father died approximately one year prior and has been feeling extremely hopeless and helpless, also worried about his mother whose health is poor.  The patient states that he was just ready to give up on his life.  PAST PSYCHIATRIC HISTORY:  The patient had attempted suicide at least one time prior in  the past by trying to hang himself.  That was approximately 15 years prior to admission.  He had been in [facility] on occasion five years prior to admission for alcohol abuse and depression and at [facility] approximately five years earlier for alcohol abuse and depression.  The patient reported he currently sees a Dr. Judithann Graves, a private psychiatric in [facility], and had been seen one week previous.  Also stated he is seeing at [facility] in [place] by Dr. Gwyndolyn Kaufman and had been seen by her three weeks prior.  SUBSTANCE ABUSE HISTORY:  The patient reported heavy alcohol use for the past 15 years and use of crack cocaine for six months.  He also smokes two to three packs of cigarettes a day and reports drinking enormous amounts of caffeine.  PAST MEDICAL HISTORY:  The patient reports that he had been seen in the [facility] Emergency Room three weeks prior to this admission and being hospitalized at [facility] for a blood clot in his lungs, stating that his symptoms around this episode involved sharp chest pain and vomiting of blood.  The patient also reported that he had a 85% gastrectomy in 1993 secondary to bleeding ulcers.  Also reported a  history of two CVAs two years ago.  The patient reported that he has been advised not to take aspirin; also that he has emphysema and experiences shortness of breath at times.  Medications at the time of admission: The patient reports that he is supposed to be taking Coumadin in amount unknown every day at 12 noon and that he has not been taking this for the past two days.  The patient also reports that he is to be taking Zoloft 200 mg q.a.m., trazodone 200 mg q.h.s., Prevacid 30 mg p.o. q.a.m., and Ventolin two puffs q.6h. p.r.n. for shortness of breath and that he uses oxygen 2 L per minute at night only while he is sleeping for his emphysema.  The patient reports no known drug allergies.  SOCIAL  HISTORY:  The patient has been separated for the past 10 years.  He lives with his mother in [place].  He has completed the 10th grade, is on disability due to his stomach problems.  Family history was remarkable for a father with a history of alcohol abuse.  PHYSICAL EXAMINATION:  Done at the [facility] Emergency Department immediately prior to admission.  The patient at that time was given Lovenox 120 mg subcutaneously.  Blood alcohol level was noted to be 215 at that time.  Urine drug screen was negative.  CBC with differential was within normal limits with the exception of ABS lymphs elevated at 3.5.  INR was 1.0, activated partial thromboplastin time was 29 seconds.  Temperature was not obtained on admission; however pulse was 83, respirations 20, blood pressure 121/83. Height 5 feet 6 inches, weight 175 pounds.  The emergency room noted that there seemed to be some question regarding if this male was actually treated with a pulmonary embolism and Dr. Kriste Basque was called at that time and stated that he has not seen this patient in some time.  MENTAL STATUS EXAMINATION:  Short, white male casually dressed who smelled of alcohol, tremulous in his up extremities with some diaphoresis.  Mood was sad and tearful.  Affect: Depressed.  Currently having suicidal ideation with intent, denied any homicidal ideation or intent.  Thought process showed no evidence of psychosis, no hallucinations, no delusions.  No signs and symptoms of withdrawal with the exception of tremors and diaphoresis.  Cognitive: Alert and oriented.  Average intelligence.  Cognitive function appears to be intact at this time.  Insight is fair.  Judgment is poor.  Impulse control: Poor.  ADMITTING DIAGNOSES: Axis I:    1. Major depression, recurrent with suicidal ideation and intent.            2. Alcohol dependence.            3. Crack cocaine abuse. Axis II:   Deferred. Axis III:  1. Emphysema.            2.  Sarcoidosis with shortness of breath.            3. History of pulmonary embolism three weeks by the patient report.            4. History of cerebrovascular accident two years ago.            5. Status post partial gastrectomy 1993             6. Reported history of bleeding ulcers. Axis IV:   Severe problems related to his primary support group, social            environment, substance  abuse, and medical problems. Axis V:    Current global assessment of functioning 30, highest past year 65.  PLAN:  Admit the patient on for detoxification off alcohol.  We elected to start him on a high dose Librium protocol and to continue the medications that he had been taking from mental health that included his Zoloft 200 mg p.o. q.a.m., Prevacid 30 mg p.o. q.a.m., trazodone 200 mg p.o. q.h.s., and Ventolin two puffs q.2h. for shortness of breath.  We also did start him back on O2 at 2 L per minute to use through the night.  Dr. Kriste Basque was contacted regarding this patients report of pulmonary embolism and use of Coumadin.  Dr. Kriste Basque indicated that he was unaware the patient had been hospitalized previous for a pulmonary embolism and Dr. Kriste Basque agreed to review his old records.  It was elected at that time to use the pharmacy protocol for anticoagulation while we awaited the records from [facility], put the patient on vital signs q.i.d. and began monitoring him for shortness of breath and chest pain.  As the patient progressed through detoxification, he noted that he was still having some problems with racing thoughts; therefore, we elected to add Seroquel to his regime, specifically Seroquel 25 mg every h.s.  We requested an internal medicine consult from Dr. Posey Rea and they reviewed his previous [facility] records from his hospitalization January 13 to the January 15.  At that time, it was noted that CT of his chest was negative for a pulmonary embolism and Dr. Posey Rea reported that the patient  was not treated with Coumadin and also that his coagulations were normal when he was treated in the emergency room at [facility] about two weeks prior to this admission.  It was Dr. Adah Perl opinion that the Lovenox provided in the emergency room for this admission was probably and empirical treatment based on the patients history.  Dr. Adah Perl impressions were: 1. The patient did not have a pulmonary embolism in January and had not been anticoagulated, 2. 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 Posey Rea recommended albuterol multidose inhaler q.i.d. and decrease the patients smoking; also pointed out that Dr. Kriste Basque had previously recommended that the patient follow up with [facility] after his discharge in January.  Based on this consult, we discontinued any plans for Coumadin and discontinued blood draws for INR daily.  It was noted on February 9 that the patients detoxification was progressing well with no evidence of major or minor withdrawal symptoms.  The patient at that time denied any suicidal concerns.  His affect was full.  He was sleeping and eating well.  On February 10, the patient reported feeling much improved, categorically denied any suicidal concerns and arrangements were made to admit to the [facility] in [place] by his case Production designer, theatre/television/film.  On February 11, the patient was feeling prepared for discharge, denied any plan for self-harm, no suicidal ideation or intent.  The patient was doing well, had tolerated detoxification well, and it was determined that he was prepared to be discharged and [facility] agreed to take him.  The patient was discharged to the [facility] and he was discharged February 11.  DISCHARGE MEDICATIONS: 1. Seroquel 25 mg one at h.s. 2. Albuterol inhaler two puffs four times a day. 3. Zoloft 100 mg two tablets every day. 4. Trazodone 200 mg at bed time. 5. Prevacid 30 mg q.a.m.  FOLLOWUP:  Between 42  noon and 2 p.m. on the day of discharge at [facility] in [place].  DISCHARGE DIAGNOSES: Axis I:    1. Major depression, recurrent, severe.            2. Alcohol dependence.            3. Crack cocaine abuse. Axis II:   Deferred. Axis III:  1. Emphysema.            2. Sarcoidosis.            3. Status post partial gastrectomy in 1993.            4. History of cerebrovascular accident two years ago. Axis IV:   Mild related to the social environment and medical problems. Axis V:    Current global assessment of functioning 55, past year 65. DD:  10/28/00 TD:  10/28/00 Job: [id number]

## 2010-12-27 NOTE — Assessment & Plan Note (Signed)
Jeffrey Crane is a 50 year old married gentleman who is accompanied by his  wife this morning to our pain and rehabilitative clinic.  He has a chief  complaint of left lower extremity pain.   He has had left knee arthroscopic surgery in January of 2008.  He has  had an MRI back in November of 2007.   He states his average pain is about a 6 on a scale of 10.   Pain is worse when he is up standing or laying flat.  Improves when he  is sitting or laying curled.  His wife states that he sleeps curled  around a pillow at night.  He reports overall poor sleep.  He has been  on a 50 mg dose of Lyrica one time a day to try to control his leg pain.  He reports it did not help.  He has also been on hydrocodone.  He states  that this did not help as well.  He brings in a prescription of  hydrocodone dated to September of 2007.  He has 26-1/2 pills of this  left.   Pain is described as fairly constant but waxes and wanes in intensity.  The pain is described as sharp, burning, and stabbing in nature, worse  with activity, improves with rest and medication.   He reports pain is localized to the left lower extremity.  Most of the  pain is into the anterior shin region, and he also has some numbness of  the great toe.  He also had some right upper extremity pain which is not  his main complaint today.  He states his pain in the upper extremities  is about a 5 on a scale of 10.  While the left leg is about an 8 on a  scale of 10, low back is between 1 and 2 on a scale of 10.   He is able to walk about 30-40 minutes at a time, he is able to climb  stairs, he is able to drive.  He is independent with his self care  including bathing, meal prep.   He is currently not working.  He stopped working in 1992.  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   He denies any problems controlling bowel or bladder.  He admits to some  depression and anxiety.   Denies suicidal ideation.   He reports weight loss, diarrhea, abdominal pain, and shortness of  breath on health and history form review of systems.   PAST MEDICAL HISTORY:  1. 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      He has been on steroids apparently for his COPD.  2. He also carries a diagnosis of sarcoidosis.   PAST SURGICAL HISTORY:  1. Positive for appendectomy in the 80s.  2. He states he has had approximately 85% of his stomach removed in      1992.  3. History of hiatal hernia.  4. He has undergone cervical spine surgery at [facility] by Dr. Manson Passey.   He also underwent left knee arthroscopic surgery in January of 2008.   Jeffrey Crane has a history of alcoholism, 20 years drinking up to 2 cases  of beer a day.  Also admits to crack cocaine use.  He has been followed  by Dr.  Wynonia Lawman and Dr. Betti Cruz, and has been on multiple medications in  the past.  Has smoked up  to 4 packs of cigarettes a day down to one pack  a day.  He has not drank alcohol nor used crack for approximately one  year now.  He does have a history of 2 DUIs as well.   FAMILY HISTORY:  Positive for heart disease, diabetes, lung disease.   MEDICATIONS:  1. Advair 250/50 one daily.  2. Tramadol 50 mg 1-2 up to three times a day.  3. Reglan 10 mg up to four times a day.  4. Flexeril 10 mg up to three times a day.  5. Hydrocort 20 mg daily.  6. Lipitor 20 mg daily.  7. Trazodone 150 q. evening.  8. Hydrocodone/acetaminophen 10/650 up to four a day per Dr. Manson Passey.  9. Nexium 40 mg daily.  10.Spiriva 1 tablet daily.  11.Albuterol 2 puffs four times a day.  12.Albuterol nebulizers.  13.Atrovent 0.5 three times a day.  14.NovoLog.   The patient brings in an MRI report dated June 20, 2006 which showed  overall mild spondylosis of the lumbar spine with mild central canal  narrowing due to bulging disk and ligamentum flavum thickening at L4-5,  as well as facet arthropathy at multiple  levels.   EXAMINATION:  Blood pressure is 116/79, pulse 69, respirations 16, he is  96% saturated on room air.  He is well-developed, well-nourished  gentleman who appears older than his stated age.   He is oriented x3, his speech is clear, his affect is alert, he is  cooperative and pleasant.  He follows commands without difficulty.   He does shift a bit during his interview with me while he is in the  chair.  He stands independently.  His gait displays a shorter stride  length on the left compared to right.  He has limitations in lumbar  range of motion with flexion and extension.  Seated his reflexes are 2+  at the patellar tendons, 1+ at the Achilles tendons.  He has decreased  sensation along the left L5 dermatome.  Motor strength however is quite  good.  No focal weakness is appreciated.  Straight leg raise is  negative.  No tenderness with palpation around the left knee.  Internal/external rotation of the left hip and right hip did not  exacerbate his pain.   Balance was evaluated with Romberg test.  He performs this adequately.  He does have some difficulty with tandem gait because he has pain with  weightbearing through the left lower extremity right now making tandem  gait difficult for him.   IMPRESSION:  1. Left lower extremity pain with numbness.  2. Status post anterior cervical fusion [facility], April 2007, Dr. Manson Passey.  3. History of EtOH abuse.  4. History of drug abuse, currently states has not used drugs for      approximately one year.  5. History of anxiety.  6. Medical problems include diabetes, chronic obstructive pulmonary      disease, sarcoidosis, chronic steroid use, osteopenia, and 85% of      stomach removed in 1992.   PLAN:  Would like to obtain other studies which apparently Dr. Manson Passey has  ordered.  I believe there may be a CT myelogram which is not attached to the chart today.  Will go ahead and trial Jeffrey Crane on Lyrica again,  however will increase  doses up to 150 a day.  We will see him back in a  month.  Obtain UDS as well.  ______________________________  Brantley Stage, M.D.     DMK/MedQ  D:  10/26/2006 12:50:16  T:  10/26/2006 17:13:08  Job #:  [id number]

## 2010-12-27 NOTE — Discharge Summary (Signed)
NAME:  Jeffrey Crane, Jeffrey Crane                 ACCOUNT NO.:  [id number]   MEDICAL RECORD NO.:  [id number]          PATIENT TYPE:  INP   LOCATION:  1431                         FACILITY:  [facility]   PHYSICIAN:  [name], MD  DATE OF BIRTH:  07/05/1961   DATE OF ADMISSION:  09/16/2005  DATE OF DISCHARGE:  09/20/2005                                 DISCHARGE SUMMARY   CHIEF COMPLAINT ON ADMISSION:  Altered mental status.   DISCHARGE DIAGNOSES:  1.  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  Please note that on      admission the patient was found to heave altered mental status with      diaphoresis, shaking, and pallor.  Blood sugar was checked and found to      be 40.  He was given glucose as well as an amp of D50 and started on      oral glucose supplementation.  His symptoms resolved after sugar      repletion, and he was brought to the emergency room where I was to admit      him for further workup of hypoglycemia.  Of note, the patient did not      carry diagnosis of diabetes; however, his wife relates episodes of      hypoglycemia in the past which at times are related to postprandial      situations.  Of note, the patient also has lost significant amount of      weight over the past couple of months to a year.  The patient was noted      to have been on and off steroid medication for COPD for the last 6      months and recently was discharged from [facility] including a      prednisone taper on the day of his symptoms.  Cosyntropin stimulation      test and general testing of morning cortisol revealed a starting      cortisol level of 0.2 to 0.3 with no stimulation.  His cortisol were 7      to 8.  The patient, therefore, qualifies as an adrenal insufficient      patient.  He, therefore, was started on hydrocortisone replacement.  It      will be recommended that he obtain an endocrinologist of his choice to      follow up with in the next 1 to 2 weeks.  2.   Gastroesophageal reflux disease with previous gastrectomy secondary to      peptic ulcer disease and hiatal hernia repair resulting in restricted      esophageal outlet.  The patient was maintained on Reglan after a brief      period of discontinuation.  It was thought on admission that the Reglan      might be contributing to hypoglycemia, but it became clear that this was      adrenal insufficiency, and, therefore, Reglan was restarted.  Of note      also on Reglan, the patient becomes quite distended and early satiety.  At the time of discharge, he has resumed Reglan and has no symptoms of      early satiety, nausea, vomiting, or vomiting.  In fact, the patient      reports that he is not able to vomit because of his previous surgery.  3.  Chronic obstructive pulmonary disease.  During the course of this      hospital stay, chronic obstructive pulmonary disease has been quite      compensated.  Will continue him on albuterol nebulizers as needed and      his inhalers, Advair and Spiriva.  He can follow up with his      pulmonologist as needed.  4.  Lower back pain. 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 We have continued him on      Lyrica.  5.  Peripheral neuropathy.  This is also treated with Lyrica.  6.  Insomnia.  The patient has resumed trazodone with good effect.  7.  Hyperglycemia secondary to steroids.  The most recent CBGs have been      moderately elevated.  I will only be asking him to use a modified      sliding scale for blood sugars greater than 150.  For a blood sugar of      151 to 200, he will take 2 units of NovoLog.  For 201 to 250, he will      take 3.  For 251 to 300, he will take 5.  For 301 to 350, he will take      7.  For greater than 350, he will take 9 and call his doctor.  8.  Tobacco abuse.  This appears to be ongoing.  He has been counseled      against this at bad effect.  9.  Recent  lower extremity swelling.  The patient's primary care physician      had been appropriately scheduling a cardiac workup for recent lower      extremity edema.  The 2-D echocardiogram was completed in hospital to      evaluate for possible cause of this as well as confirm that there is no      vegetative disease causing endocarditis which could have presented as      occult hyperglycemia.  Prior to receiving the results of a morning      cortisol, which was extremely low, a differential diagnosis of possible      occult infection and endocarditis was entertained.  The patient had      recently had cat scratch to the back of his hand from a cat at home but      no evidence of systemic disease other than hypoglycemic event.  Blood      cultures were obtained.  These were negative.  A 2-D echocardiogram was      obtained which showed a normal ejection fraction and no vegetation.   DISCHARGE MEDICATIONS:  1.  Spiriva 18 mcg inhaled daily.  2.  Trazodone 150 mg p.o. nightly.  3.  Lyrica 50 mg nightly.  4.  Advair 250/50 one puff inhaled twice daily.  5.  Reglan 10 mg before each meal and at bedtime.  6.  Hydrocortisone 10 mg at 8 p.m., hydrocortisone 20 mg at 8 a.m.  7.  Albuterol MDI 2 puffs 4 times daily as needed.  8.  Albuterol 2.5 mg nebulizer  every 6 hours as needed.  9.  Nexium 40 mg. This has been increased to twice daily because of the      steroids.  10. I will be leaving a sliding scale insulin, and the patient and his wife      will be instructed on administration.  The sliding scale is as stated.      Blood sugar less than 60, he should take no insulin. For 60 to 100, no      insulin.  For blood sugar 101 to 150, 2 units of insulin should be      utilized; 201 to 250, 3 units; 251 to 300, 5 units; 301 to 350, 7 units;      greater than 350, 9 units and call doctor.   HISTORY OF PRESENT ILLNESS:  The patient is a very pleasant 50 year old gentleman who recently was treated at  [facility] for COPD  exacerbation. He presented to the hospital with altered mental status,  diaphoresis, treated at home initially by his wife with some peanut butter.  The patient was astute enough to recognize a low blood sugar which he has  had in the past, but when he continued to have altered mental status, had  him brought to the emergency room.  EMS checked the patient and found that  his blood sugar was 40s to 50s.  He was given an amp of D50 with significant  improvement in his mental status.  In the emergency room, he was noted to be  alert and oriented x3.  The patient had no other complaints, and we were  asked to admit because of the new onset of hypoglycemia.   In the emergency room initially, the patient was noted to have cat scratches  on the back of his hand. The differential diagnosis, therefore, for occult  hypoglycemic causes included occult infection.  The blood cultures were sent  out.  A 2-D echocardiogram was ordered and completed showing no obvious  vegetations or cardiomyopathy.  Initially the patient's knowledge of  hypoglycemia and his wife's knowledge of hypoglycemia was felt to be  extensive, out of proportion to the usual patient.  Therefore, specific  testing was sent for factitious secondary causes.   Analyzing the patient's recent history and obtaining further information  suggesting that the patient had significant exposure to steroids for most of  the last year, it was plausible that he, indeed, was suffering more from  adrenal insufficiency than any other cause for hypoglycemia.  A cortisol  level was, therefore, drawn in the morning and found to be quite low at 0.3.  A cosyntropin skin test was attempted to be obtained to confirm this  finding, and, indeed, the patient's morning cortisol prior to the test was  0.2.  His simulation test, while was not completely performed correctly in  that the 30-minute cortisol and 60-minute cortisol were  actually separated  by an hour, the patient was seen to have only a level of 7 to 8 with regard  to his cortisol.  Of note, the first night that the patient was in the  hospital, he had a recurrent episode of hypoglycemia, and he was started on  dexamethasone in order to protect him from further events while we confirmed  the diagnosis of adrenal insufficiency.  After confirming adrenal  insufficiency, the patient was started on hydrocortisone 10 mg nightly and  20 mg q.a.m. with good results. The patient maintained his blood sugars and  in fact  were slightly elevated.  For the following 24 hours, he was able to  eat without nausea and vomiting, and his blood pressure remained stable.  He  had no further events after being started on steroid medications.   On the day of discharge, the patient continues to be able to tolerate  hydrocortisone.  He recommended that he follow up with an endocrinologist in the next 1 to 2 weeks to further evaluate his condition.  Of note,  laboratory values sent off for factitious hyperglycemia returned as follows.  The C peptide was 7.32.  His random insulin level in the morning was 24.  A  sulfanuria level is still pending, and insulin antibody is still pending.   On the day of discharge, the patient's vital signs were stable with  temperature of 98.2 with a slight temperature spike at 100.2 the night prior  of unclear etiology.  The patient denies cough, dysuria, or diarrhea.  He  states that he was covered up under the blankets because he was extremely  cold at that time.  He has had no further fevers nor do I believe him to be  in infected. The patient is a is a moderately developed white male in no  acute distress.  He is normocephalic and atraumatic.  Pupils equal, round,  and reactive to light. Extraocular muscles intact.  Mucous membranes moist.  Neck is supple.  There is no JVD, no carotid bruits.  He has a ruddy  appearance to his neck and chest.   His chest is clear to auscultation but  decreased at the bases. Cardiovascular reveals regular rate and rhythm,  positive S1 and S2.  No S3, S4, murmurs, rubs, or gallops.  Abdomen is soft  nontender, nondistended with positive bowel sounds.  Extremities show no  clubbing, cyanosis, or edema at this time. Neurologically, cranial nerves II-  XII intact.  Power is 5/5.  DTRs are 2+.   Pertinent laboratory values prior to discharge as stated, there are pending  secondary factitious causes for hypoglycemia pending which at this point  become moot.  His CBGs on the day prior to discharge reveal fasting blood  sugar of 177, 117, 110, and at the highest 233.  His hemoglobin was 12.5,  hematocrit 37, white count 8.5, platelets 127.  Sodium 140, potassium 4.2,  chloride 111, CO2 26, BUN 19, creatinine 1.1.  His morning glucose is 99.  LFTs are within normal limits.  Calcium is 8.5 with an albumin of 3.  Blood  cultures have remained negative.  His highest cortisol was 8.6.   At this time, the patient is deemed stable for follow up with an  endocrinology appointment and with his primary care physician, Dr. Philipp Deputy  at least in one week.   CONDITION ON DISCHARGE:  Stable.   DISPOSITION:  To home.  The patient has been given information on  carbohydrate-modified medium diet and has been given instruction on using  sliding scale insulin.      [name], MD  Electronically Signed     MLT/MEDQ  D:  09/20/2005  T:  09/20/2005  Job:  [id number]   cc:   Tresa Endo L. Philipp Deputy, M.D.  Fax: [phone]

## 2010-12-27 NOTE — Discharge Summary (Signed)
NAME:  Jeffrey Crane, JURNEY NO.:  [id number]   MEDICAL RECORD NO.:  [id number]                   PATIENT TYPE:  IPS   LOCATION:  0504                                 FACILITY:  [facility]   PHYSICIAN:  Jeanice Lim, M.D.              DATE OF BIRTH:  10-Feb-1961   DATE OF ADMISSION:  03/18/2003  DATE OF DISCHARGE:  03/23/2003                                 DISCHARGE SUMMARY   IDENTIFYING DATA:  This is a male threatening to hang himself or jump from a  bridge prior to admission.  He reported that he and his mother were being  evicted from their home and that he was depressed and suicidal.  He was also  worried about his drinking and cocaine use.  In the ER, his alcohol level  was 284; urine drug screen positive for cocaine.  This was a typical  presentation for this individual.  The patient had been using cocaine and  alcohol heavily and requested detoxification and help with depression.   MEDICATIONS:  The patient had been on Lexapro, Wellbutrin, and Seroquel in  the past.  He has a history of emphysema and was taking Combivent for this  but compliance was questionable.   DRUG ALLERGIES:  No known drug allergies.   PHYSICAL EXAMINATION:  GENERAL:  Within normal limits.  He had some  abdominal scars consistent with stomach surgery in 1992 for bleeding ulcer  and hernia repair.  NEUROLOGIC:  Nonfocal.   MENTAL STATUS EXAM:  Alert and oriented, disheveled, cooperative behavior,  goal directed.  Speech: Within normal limits.  Mood: Depressed.  Thought  process: Goal directed.  No psychotic symptoms and passive suicidal  ideation.  Cognitive: Intact.  Judgment and insight: Fair.   ADMISSION DIAGNOSES:   AXIS I:  1. Cocaine and alcohol dependence.  2. Polysubstance abuse.  3. 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   AXIS II:  Deferred.   AXIS III:  1. Emphysema.  2. History of sarcoidosis.  3. Partial  gastrectomy.   AXIS IV:  Moderate; limited support system, financial, occupation, and  medical problems.   AXIS V:  30/55   HOSPITAL COURSE:  The patient was admitted, ordered routine p.r.n.  medications, underwent further monitoring, and was encouraged to participate  in individual, group, and milieu therapy.  The patient was resumed on  previous medications and placed on low dose Librium detoxification protocol  for safe withdrawal.  He was decreased on Seroquel.  Wellbutrin was  optimized, targeting depressive symptoms and Seroquel optimized as well as  Lexapro.  The patient reported a positive response to clinical intervention,  mood improved, reported no withdrawal symptoms, tolerating detoxification  well and reporting motivation to be compliant with the aftercare plan.  The  patient reported no dangerous ideation or psychotic symptoms or withdrawal  symptoms at the time  of discharge.  He was to follow up with [facility],  appeared highly motivated.  He was aware of the risk/benefit ratio and  alternative treatments regarding medications and side effects, which were  reviewed on more than one occasion.  The patient was comfortable with  medication regimen and aftercare plan.   DISCHARGE MEDICATIONS:  1. Combivent inhaler two puffs four times a day.  2. Wellbutrin XL 300 mg q.a.m.  3. Seroquel 100 mg one and a half q.h.s.  4. Lexapro 10 mg one and a half q.a.m.   FOLLOW UP:  The patient was to follow up with the [facility] on Friday, August  13, at 4:30.   DISCHARGE DIAGNOSES:   AXIS I:  1. Cocaine and alcohol dependence.  2. Polysubstance abuse.  3. 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   AXIS II:  Deferred.   AXIS III:  1. Emphysema.  2. History of sarcoidosis.  3. Partial gastrectomy.   AXIS IV:  Moderate; limited support system, financial, occupation, and  medical problems.   AXIS V:  Global assessment of functioning on  discharge was 55.                                               Jeanice Lim, M.D.    JEM/MEDQ  D:  04/17/2003  T:  04/18/2003  Job:  [id number]

## 2010-12-27 NOTE — H&P (Signed)
NAME:  Jeffrey Crane, HOLCOMB NO.:  [id number]   MEDICAL RECORD NO.:  [id number]          PATIENT TYPE:  EMS   LOCATION:  ED                           [facility]ACILITY:  [facility]   PHYSICIAN:  Hollice Espy, M.D.DATE OF BIRTH:  07-15-1961   DATE OF ADMISSION:  09/16/2005  DATE OF DISCHARGE:                                HISTORY & PHYSICAL   ATTENDING PHYSICIAN:  [name], M.D.   PRIMARY CARE PHYSICIAN:  [name], M.D. of [facility].   CHIEF COMPLAINT:  Altered mental status.   HISTORY OF PRESENT ILLNESS:  The patient is a 50 year old white male with  past medical history of tobacco abuse and COPD who just had a recent  exacerbation and was discharged from [facility] on September 05, 2005.  It was at that time, he was on IV steroids and receiving insulin during his  hospitalization.  He was discharged on p.o. prednisone and has not been on  any type of insulin medications since then.  The patient's wife tells me  that since the patient has been home, he has had episodes of what she thinks  may have been low blood sugars, although, they do not have a Glucometer to  check this, but he has been drowsy, blurred vision, weakness and these  resolve when he gets some peanut butter.  A similar episode happened today,  although it was much more profound.  The patient was extremely weak and  unable to make any sense.  Unusually, the patient's wife took the patient  with her to work today and when symptoms persisted, even for several hours.  She then brought him here to the emergency room.  The story appears to be  quite vague as far as events surrounding him coming in.  She called the  paramedics and when he was evaluated en route, he was noted to have normal  labs and normal vitals, however, his blood sugar was low at 50.  Subsequent  checks once he was brought into the emergency room, a repeat BMET noted a  glucose of 40.  The patient was given amps of D50  and started on p.o.  His  mental status has improved.  He is slightly drowsy, but completely alert and  oriented x3 at this point.  He denies any complaints, although he says his  breathing is a little tight.  Normally he takes his nebulizers by now.  He  denies any headaches or vision changes currently, but he has blurry visions  when these episodes reportedly occur.  No dysphagia, no chest pain,  palpitations.  He has some mild wheezing, no coughing, no abdominal pain, no  hematuria, dysuria, constipation, diarrhea, focal extremity numbness  weakness or pain.   REVIEW OF SYSTEMS:  Otherwise negative.   PAST MEDICAL HISTORY:  GERD, COPD, ongoing tobacco use, and peripheral  neuropathy.  He also is being evaluated for lower extremity swelling.   MEDICATIONS:  1.  Albuterol nebulizers plus inhalers.  2.  Advair.  3.  Lasix.  4.  Nexium.  5.  Reglan.  6.  Trazodone.  7.  He just finished a prednisone taper today.  8.  Spiriva.  9.  Lyrica.   ALLERGIES:  ACETAMINOPHEN which causes rash, NSAIDS. He has a previous  history of a bleeding ulcer.   SOCIAL HISTORY:  He admits to smoking half pack a day, but he has been clean  and sober off of crack cocaine and alcohol for the last 9 months.   FAMILY HISTORY:  Noncontributory.   The patient also has been periodically on the nicotine patch to help  decrease his smoking.   PHYSICAL EXAMINATION:  VITAL SIGNS:  Temperature 96.5, heart rate 99, blood  pressure 143/76, since then it has come down a little bit to about 99/60,  respirations 20, O2 saturation 95% on room air.  GENERAL:  The patient is slightly sleepy, but he is able to be awakened and  is alert and oriented x3 in no acute distress.  HEENT:  Normocephalic and atraumatic.  His mucous membranes are moist.  He  has no carotid bruits.  Cranial nerves II-XII grossly intact.  HEART:  Regular rate and rhythm, S1 and S2.  LUNGS:  Clear to auscultation bilaterally.  ABDOMEN:  Soft,  nontender, and nondistended with positive bowel sounds.  EXTREMITIES:  No cyanosis, clubbing, or edema.  He has no focal neurological  deficits.   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   LABORATORY DATA:  White count 8.9, H&H 13.3 and 38.5, MCV 85, platelet count  168, and no shift. Sodium 136, potassium 3.4, chloride 107, bicarb 26, BUN  16, creatinine 0.9, glucose 40. Since then his blood sugar has gone up to  122.  LFT's are otherwise unremarkable, although, his albumin is low at 3.  Coags are unremarkable.  UA shows greater than 1000 of glucose.  Cardiac  enzymes are unremarkable.  ABG is completely normal.  He is noted to have  some calcium oxalate crystals in his urine.   ASSESSMENT:  1.  Hypoglycemia.  The patient's etiology for low blood sugar is completely      unclear to me at this time. He has no evidence of any infection.  He is      not a diabetic or on any type of diabetic medications.  He has no reason      for why he should be hypoglycemic.  I have reviewed the literature in      terms of working this up.  My biggest suspicion is factitious      hypoglycemia.  I have discussed this case with the ER attending, Devoria Albe, M.D.  We both agree that the patient's wife at times seems      inappropriate and while on one hand she seems less knowledgeable about      some medical causes, she occasionally uses medical terminology freely.      To this end, I am checking a serum insulin, serum sulfonylurea, as well      as anti-insulin antibodies, and an insulin C peptide level.  The      reference ranges on these in comparison to the other should account for      any possible factitious hypoglycemia.  If indeed this is the cause, we      will need to certainly look into whether this is self-induced or if this     is some attempt by poisoning.  We will also check a fasting blood  sugar      on the patient tomorrow morning to better  assess the cause of this.  2.  Glucose in the patient's urine.  The patient has just finished a course      of prednisone, which in review of literature can cause episodic      glucosuria.  It is mild and I would not further worry about this.  3.  In addition, I have reviewed all of the patient's medications.  Nothing      acutely can cause hypoglycemia, although Reglan can cause some      electrolyte abnormalities.  I would favor holding on that for now.  4.  Chronic obstructive pulmonary disease.  Start the patient on albuterol      nebulizers as well as Spiriva.  5.  Tobacco abuse which is ongoing.  6.  Gastroesophageal reflux disease.  Continue his Protonix.      Hollice Espy, M.D.  Electronically Signed     SKK/MEDQ  D:  09/17/2005  T:  09/17/2005  Job:  [id number]   cc:   [name], M.D.

## 2010-12-27 NOTE — Discharge Summary (Signed)
NAME:  Jeffrey Crane, Jeffrey Crane NO.:  [id number]   MEDICAL RECORD NO.:  [id number]          PATIENT TYPE:  IPS   LOCATION:  0301                          FACILITY:  [facility]   PHYSICIAN:  Geoffery Lyons, M.D.      DATE OF BIRTH:  December 05, 1960   DATE OF ADMISSION:  06/16/2004  DATE OF DISCHARGE:  06/26/2004                                 DISCHARGE SUMMARY   CHIEF COMPLAINT AND PRESENT ILLNESS:  This was the second admission to [facility] for this 50 year old, divorced, white male,  voluntarily admitted.  History of alcohol dependence, drinking a case or  more per day.  Last drink was June 16, 2004 about 9:30 in the morning.  He wanted to get help with his drinking.  Upset about the money he was  spending, hurting his family.  He stated that he gets loud when he drinks,  but there is no violence.  Started using crack cocaine during the weekend.  He was sober from alcohol for 10 months; relapsed about two months prior to  this admission.  Worried over his health, feeling very depressed, wanting to  go to sleep, hoping not to wake up in the morning.   PAST PSYCHIATRIC HISTORY:  Last admission November 2004.  He overdosed on  Lexapro and Seroquel.  He had been in a 28 day program after his last  admission.  No current outpatient treatment.  Longest sobriety 10 months.   ALCOHOL/DRUG HISTORY:  As already stated.  Persistent use of alcohol.  Longest sober 10 months, history of blackouts and drinking in the morning.   PAST MEDICAL HISTORY:  1.  Emphysema.  2.  Sarcoidosis.  3.  Bleeding ulcers in 1992.   MEDICATIONS:  Has been on Seroquel and Wellbutrin, not taken for at least a  couple of months.   PHYSICAL EXAMINATION:  Performed and failed to show any acute findings.   LABORATORY WORKUP:  TSH 0.654.  Blood chemistries within normal limits.  CBC  within normal limits.  Urine drug screen positive for cocaine.  Alcohol  level was 191.   MENTAL STATUS EXAM:   Reveals an alert, cooperative male.  Good eye contact.  Casually dressed.  Speech was clear, normal rate, tempo and production.  Mood was hopeless, worthless.  Affect was teary eyed, pretty flat.  Thought  processes were logical, coherent and relevant.  No evidence of delusions.  No active suicide, homicide ideations.  Cognition was well-preserved.   ADMISSION DIAGNOSES:   AXIS I:  1.  Depressive disorder, not otherwise specified.  2.  Alcohol dependence.  3.  Cocaine abuse.   AXIS II:  No diagnosis.   AXIS III:  1.  Emphysema.  2.  Sarcoidosis.   AXIS IV:  Moderate.   AXIS V:  Global Assessment of Functioning upon admission was 35; highest  Global Assessment of Functioning in the last year 65.   COURSE IN HOSPITAL:  He was admitted and started in individual and group  psychotherapy.  He was given trazodone for sleep.  He was given Seroquel  as  needed, 50 mg every six hours, Protonix 40 mg daily.  He was detoxified with  Librium.  He was maintained on his albuterol inhaler two puffs three times a  day and Combivent inhaler two puffs twice a day.  Had difficulty with  sleeping and was given Seroquel 100 at night and started on Lexapro 5 mg per  day.  Lexapro was discontinued.  He was placed on Zoloft 25 mg per day.  He  went through the detox and stated that he was able to stay sober for almost  a year.  Did endorse that he did not take his medication.  Not much to do  but stay in the house, stay in front of the TV, play video games.  Endorsed  difficulty with depression, wanting to die, not wanting to go on.  He did  say that he did well on Zoloft and Wellbutrin and was willing to continue  these medications.  We went ahead and discontinued the Lexapro and started  Wellbutrin and Zoloft.  Continued to endorse persistent depression.  Understood that the medication was going to take time before it was going to  work.  He was willing to give the time necessary and continued to  take the  Zoloft and the Wellbutrin.  There was evidence of psychomotor retardation.  He was able to tolerate the medications well.  Sleep was an issue.  He had  been given some Atarax for itching.  He was given Rozerem 8 mg at night for  sleep and Zoloft was increased to 50 mg per day.  Seroquel was increased to  150 and then to 200 and finally Zoloft was increased to 75 and Wellbutrin XL  to 300 mg per day.  As we adjusted his medications, his mood gradually  improved, his affect became brighter, he was more focused in what he had to  do, more optimistic.  He was wanting to go back to a residential treatment  program, [address], where he went before and was able to stay abstinent  for 10 months.  He was accepted into the program.  This was very encouraging  for him.  Felt supported.  His mood improved.  His affect became brighter.  He was endorsing no further suicidal, homicidal ideation, so we went ahead  and discharged to outpatient treatment to pursue per the residential  treatment center at [facility].   DISCHARGE DIAGNOSES:   AXIS I:  1.  Alcohol dependence.  2.  Cocaine abuse.  3.  Major depression, recurrent.   AXIS II:  No diagnosis.   AXIS III:  1.  Emphysema.  2.  Sarcoidosis.   AXIS IV:  Moderate.   AXIS V:  Global Assessment of Functioning upon discharge was 55 to 60.   DISCHARGE MEDICATIONS:  1.  Protonix 40 mg per day.  2.  Combivent inhaler one puff twice a day.  3.  Rozerem 8 mg at night for sleep.  4.  Hydrocortisone cream applied to area twice a day.  5.  Seroquel 200 at night.  6.  Zoloft 50 one and a half at night.  7.  Wellbutrin XL 300 in the morning.  8.  Albuterol inhaler two puffs three times a day as needed.  9.  Vistaril 25 one three times a day as needed.   FOLLOW UP:  Discharged to go to residential treatment program in [place].     [name]  IL/MEDQ  D:  07/22/2004  T:  07/23/2004  Job:  [id number]

## 2010-12-27 NOTE — H&P (Signed)
NAME:  Jeffrey Crane, Jeffrey Crane NO.:  [id number]   MEDICAL RECORD NO.:  [id number]          PATIENT TYPE:  IPS   LOCATION:  0301                          FACILITY:  [facility]   PHYSICIAN:  Geoffery Lyons, M.D.      DATE OF BIRTH:  [birth year]   DATE OF ADMISSION:  06/16/2004  DATE OF DISCHARGE:                         PSYCHIATRIC ADMISSION ASSESSMENT   IDENTIFYING INFORMATION:  This is a 50 year old, divorced, white male,  voluntarily admitted on June 16, 2004.   HISTORY OF PRESENT ILLNESS:  The patient presents with a history of alcohol  dependence.  Has been drinking a case or more per day.  His last drink was  on June 16, 2004 at about 9:30 in the morning.  Patient wants to get help  with his drinking.  He is upset about the money he is spending; hurting his  family.  He states that he gets loud when he drinks, but there is no  violence.  He also started using crack cocaine this past weekend.  Patient  reports that he was sober from alcohol from 10 months and relapsed about two  months ago.  The patient is worried over his health, feeling very depressed.  He states that he wants to go to sleep and hoping not to wake up in the  morning.  He reports he has not been sleeping well, sleeping about two to  three hours and then up for the remainder of the night.  His appetite has  been satisfactory.   PAST PSYCHIATRIC HISTORY:  The patient was here in November of 2004 when he  overdosed on Lexapro and Seroquel.  Also there was some alcohol abuse, but  he has had no other psychiatric admissions.  He did attend the 28 day  program after his last admission.  No current outpatient treatment and has  been detoxed several times in the past.  His longest history of sobriety has  been 10 months.   SOCIAL HISTORY:  This is a 50 year old, divorced, white male.  Divorced for  years.  He is currently living with his mother.  He is on disability for his  medical illness.  He is also on  probation for unauthorized use of a motor  vehicle.  Patient was in jail for DUIs in the past.   FAMILY HISTORY:  Father had alcohol problems.   ALCOHOL/DRUG HISTORY:  Patient smokes three packs a day.  History of  blackouts, drinks in the morning.  Denies any seizure activity.   PRIMARY CARE Jeffrey Crane:  [facility].   PAST MEDICAL HISTORY:  1.  Emphysema.  2.  Sarcoidosis.  3.  History of bleeding ulcers back in 1992.   MEDICATIONS:  He has been on Seroquel and Wellbutrin and has not been taking  any medicines for at least a couple of months.   DRUG ALLERGIES:  No known drug allergies.   PHYSICAL EXAMINATION:  The patient was assessed at [facility] Emergency  Room.  He is in no acute distress.  No respiratory distress.  Vital signs  were stable; temperature is 97.6; 90 heart  rate; 20 respirations; blood  pressure is 105/62; 170 pounds.  The patient does have some mild expiratory  wheezing and rhonchi that clears with a cough.  He also has nicotine stained  fingers.  He does not present with any tremors at this time.   LABORATORY DATA:  BUN is 5.  CBC shows basal fields of 2.  Urine drug screen  is positive for cocaine.  Alcohol level is 191 on admission to the emergency  room.   MENTAL STATUS EXAM:  He is a middle aged male, cooperative.  Good eye  contact.  Casually dressed.  Speech is clear.  Patient feels hopeless and  worthless.  His affect is teary-eyed, flat.  Thought processes are coherent  with no evidence of psychosis.  Cognitive function intact.  Memory is fair.  Judgment and insight are fair.  Poor impulse control.   AXIS I:  1.  Depressive disorder, not otherwise specified.  2.  Alcohol dependence.  3.  Cocaine abuse.   AXIS II:  Deferred.   AXIS III:  1.  Emphysema.  2.  Sarcoidosis.  3.  Tobacco abuse.   AXIS IV:  Medical problems, other psychosocial problems, legal system.   AXIS V:  Current is 35; past year is 70.   PLAN:  Detox the patient safely  from alcohol.  We will initiate  antidepressant with risks and benefits discussed.  The patient is encouraged  fluids and attend individual and group therapy.  We will work on relapse  prevention.  Tobacco cessation was discussed.  Patient at this time is  currently wearing a nicotine patch.  Case management is to look at potential  rehabilitation.  We will monitor respiratory function on patient while here;  check pulse oximetries.   TENTATIVE LENGTH OF STAY:  Four to five days.     Jani   JO/MEDQ  D:  06/17/2004  T:  06/17/2004  Job:  [id number]

## 2010-12-27 NOTE — Discharge Summary (Signed)
NAME:  Jeffrey Crane, Jeffrey Crane NO.:  [id number]   MEDICAL RECORD NO.:  [id number]                   PATIENT TYPE:  IPS   LOCATION:  0300                                 FACILITY:  [facility]   PHYSICIAN:  Geoffery Lyons, M.D.                   DATE OF BIRTH:  1961-01-22   DATE OF ADMISSION:  01/31/2003  DATE OF DISCHARGE:  02/10/2003                                 DISCHARGE SUMMARY   CHIEF COMPLAINT AND PRESENTING ILLNESS:  This was the second admission to  [facility], the last time 2 years prior to this one, for  this 50 year old white male, divorced, voluntarily admitted.  Relapsed on  alcohol January 2004, after being active in 1998 and sober for a year.  Disturbed by girlfriend's death by leukemia 3 months prior to this  admission, relapsed, still grieving.  He felt he could handle it, also was  using cocaine after the heavy drinking smoked crack.  Requested detox and  help with the depression.  Endorsed severe anhedonia, depressed mood,  suicidal ideas without a plan.   PAST PSYCHIATRIC HISTORY:  Second time [facility], last time 2  years prior to this admission.  History of depression, suicide attempt by  hanging.   ALCOHOL AND DRUG HISTORY:  As already stated, regular, persistent use of  alcohol and cocaine.   PAST MEDICAL HISTORY:  Emphysema, sarcoidosis, gastric ulcers, gastric  resection, hiatal hernia.   MEDICATIONS:  Combivent inhaler, albuterol.   PHYSICAL EXAMINATION:  Performed, failed to show any acute findings.   MENTAL STATUS EXAM:  Reveals an alert, cooperative male, no tremors,  spontaneous, mood depressed, affect depressed, some psychomotor retardation.  Thoughts deals with the events that led to being admitted and the out of  control use of alcohol and cocaine.  Persistent depression, wanting to die.  Did endorse auditory and visual hallucinations where he sees fathers with  whom he carries on a  conversation and commands him to straighten out.  Drinking 12-18 beers a day for the last 2 months, and using $150 per day of  crack cocaine 3 weeks out of every month.  Cognition well preserved.   ADMISSION DIAGNOSES:   AXIS I:  1. Cocaine and alcohol abuse rule out dependence.  2. Major depression, single episode.   AXIS II:  No diagnosis.   AXIS III:  Emphysema, sarcoidosis, partial gastrectomy.   AXIS IV:  Moderate.   AXIS V:  Global assessment of function upon admission 29, highest global  assessment of function in past year 60.   LABORATORY DATA:  Blood chemistries were within normal limits.  CT scan:  Minimal diffuse cortical atrophy, no acute abnormality.   COURSE IN HOSPITAL:  He was admitted and started on intensive individual and  group psychotherapy.  He was detoxified with Librium.  He was given the  Combivent inhaler.  He was started on Lexapro 5 mg per day and Seroquel  initially 100 was increased to 150.  Lexapro was increased to 5 twice a day  and he was given some Ultram for pain.  Continued to experience lack of  energy, lack of motivation and persistent depressed mood so he was started  on Wellbutrin XL 150 in the morning and eventually Seroquel was increased to  250 at night.  In group and individually, he was able to express his  feelings have to do with grief and loss of the girlfriend, illness of the  mother, losses in his life, suicidal ruminations, overwhelmed with the  emotional pain.  Lots of anxiety.  He claimed that the anxiety kept him from  being able to be around people.  He was in pain from somatization, some  headache.  Every time he got some bad news from home he  started  catastrophysing and felt worse.  On those occasions he would isolate and  evidence more psychomotor retardation.  He went through the first of his  girlfriend's death remaining in bed, isolated.  But gradually he started  coming out.  He admitted that he might be starting  feeling better.  Had a  visit with his mother that turned out to be positive, and on July 2 he was  much improved, more optimistic about his future, his situation, wanting to  pursue further work on an outpatient basis and work on long-term abstinence.  On discharge in full contact with reality, no suicidal or homicidal ideas,  much improved.   DISCHARGE DIAGNOSES:   AXIS I:  1. Alcohol and cocaine dependence.  2. Major depressive disorder.  3. 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   AXIS II:  No diagnosis.   AXIS III:  Emphysema, sarcoidosis, status post partial gastrectomy.   AXIS IV:  Moderate.   AXIS V:  Global assessment of function upon discharge 50-55.   DISCHARGE MEDICATIONS:  1. Combivent inhaler 2 puffs 4 times a day.  2. Lexapro 10 mg daily.  3. Wellbutrin XL 150 in the morning.  4. Seroquel 100 2-1/2 at bedtime.  5. Ultram as needed for pain short term.   DISPOSITION:  Follow up with [facility], [facility].                                                Geoffery Lyons, M.D.    IL/MEDQ  D:  03/07/2003  T:  03/07/2003  Job:  [id number]

## 2010-12-27 NOTE — H&P (Signed)
[facility]  Patient:    Jeffrey Crane, Jeffrey Crane                        MRN: [id number] Adm. Date:  [id number] Attending:  Denny Peon Dictator:   Johnella Moloney, NP                         History and Physical  PATIENT IDENTIFICATION:  Jeffrey Crane is a 50 year old white separated male admitted 09/17/00 on a voluntary basis.  Chief complaint: "Here for alcohol, drugs and suicide."  HISTORY OF PRESENT ILLNESS:  Patient presented to [facility] Emergency Department last night after calling 911 and an ambulance transported him to the Emergency Department.  Patient was feeling depressed, with severe thoughts of suicide.  He was thinking about either jumping off a bridge or hanging himself.  Reports he feels down and out now.  He remains suicidal.  He states he wants to straighten out his life.  He apparently had drank a 12-pack of beer prior to going to [facility] ED late last evening.  He has been drinking nonstop for at least a year, drinking a 12-pack of beer up to a case and a half of beer every day for the last year.  Last drink September 16, 2000.  He has no history of DTs, no history of seizures.  He does have a history of blackouts.  Sleep is poor, averaging 2 hours a night for 6 years, with frequent awakenings.  However, he does sleep with a Trazodone.  Appetite is poor, with a weight loss of 15 pounds in 2 weeks.  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  Patient also has been using crack cocaine about $200 worth every weekend, for the last 6 months. Last use 2 weeks ago.  He has been depressed since his dad died, 1999/09/28, tearful, feeling hopeless and helpless, worried about his mom whose health is poor.  She apparently just had two strokes.  Sometimes he is just ready to give up.  PAST PSYCHIATRIC HISTORY:  Patient states that he has attempted suicide one time in the past by trying to hang himself.  That was 15 years  ago.  He has been to [facility] on one occasion 5 years ago for alcohol abuse and depression, [facility] x 1 five years ago for alcohol abuse and depression.  He states he sees Dr. Judithann Graves, a private psychiatrist in [facility], who he last saw a week ago.  He also states he sees he sees [facility] here in [place], Dr. Gwyndolyn Kaufman, who he last saw 3 days ago.  SUBSTANCE ABUSE HISTORY:  Patient has been drinking heavily for the last 15 years.  He has been using crack cocaine for 6 months.  He smokes 2-3 packs of cigarettes a day, drinks enormous amounts of caffeine every day.  POSITIVE PHYSICAL FINDINGS: Please see physical examination done at [facility] Emergency Department September 16, 2000 or either September 17, 2000.  He was given Lovenox 120 mg sub-q at 7:45 this morning, September 17, 2000, which is equivalent to a low molecular heparin.  His blood alcohol level right after midnight on September 17, 2000, was 215.  Urine drug screen was negative.  CBC with diff within normal limits with exception of ABS lymph was elevated at 3.5.  His INR was 1.0  proton, 5.5 seconds.  Activated partial TS was 29 second.  Temperature not obtained on admission, pulse 83, respirations 20, blood pressure 121/83.  Height 5 foot 6 inches, weight 175 pounds.  There is some question if this man was actually treated for a pulmonary embolism 3 weeks, since Dr. Kriste Basque was called and states that he has not seen him in some time.  PAST MEDICAL HISTORY:  Patient states his primary care physician, or lung specialist, Dr. Kriste Basque who he last saw about 3 weeks ago when he was hospitalized at [facility] ED.  He states he usually goes to [facility]. Medical problems:  Patient reports he was hospitalized at [facility] ED 3 weeks ago for "a blood clot" in his lungs, i.e. pulmonary embolism.  He was hospitalized for 6 days.  He states his symptoms were sharp chest pain and  he was throwing up blood.  He reports 85% of his stomach was removed in 1993 due to bleeding ulcers.  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  Medications:  Patient reports that he is supposed to be taking Coumadin, amount unknown, every day at noon.  He states he has not taken this for 2 days, Zoloft 200 mg q.a.m., Trazodone 200 mg h.s., Prevacid 30 mg p.o. q.a.m., Ventolin 2 puffs q.6h. p.r.n. for lung problems, oxygen at night 2 liters for his lung disease.  He does experience shortness of breath at times.  DRUG ALLERGIES:  He is not supposed to take aspirin, otherwise he has no known drug allergies.  SOCIAL HISTORY:  Patient has been separated for 10 years.  He has no children. He still sees his wife every week.  His mother is living but in poor health. He lives with his mother here in [place].  His father died last year, and he identifies this as one of the stressors that has made him very depressed. He has 1 brother, 2 sisters.  He completed the 10th grade.  He is on disability due to his stomach.  He received Social Security Disability, has Medicaid and Medicare.  FAMILY HISTORY:  Father had a history of alcohol problems.  MENTAL STATUS EXAMINATION:  A short, white male, casually dressed.  He smells of alcohol, even though his last drink supposedly was yesterday.  He is pleasant.  He does have tremors in upper extremities, with diaphoresis. Speech normal tone and relevant.  Mood sad, tearful.  Affect depressed.  He is currently having suicidal ideation with intent, denies homicidal ideation or intent.  Thought process:  Currently there is no evidence of psychosis, no hallucinations, no delusions, no signs and symptoms of withdrawal with exception of the tremors and diaphoresis.  Cognitively, he is alert and oriented.  He is of average intelligence.  Cognitive function appears to be intact at this time.  Insight fair, judgment poor,  impulse control poor.  ADMISSION DIAGNOSES: Axis I:    1. Major depression, recurrent, with suicidal ideation and               intent.            2. Alcohol dependence.             3. Crack cocaine abuse. Axis II:   Deferred. 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 Axis IV:   Severe, related to problems with primary support group, social            environment, substance abuse, and medical problems. Axis V:    Current global assessment of functioning 30, highest past year 65.  INITIAL PLAN OF CARE:  Voluntary admission to [facility] unit.  We will detox using high-dose Librium protocol.  Will continue him on medications that he has been taking either from mental or Dr. Judithann Graves, Zoloft 200 mg p.o. q.a.m., Prevacid 30 mg p.o. q.a.m., Trazodone 200 mg p.o. h.s., Ventolin 2 puffs q.6h. p.r.n. shortness of breath, O2 two liters at h.s. Patient uses this all night.  We will contact Dr. Kriste Basque regarding Coumadin, the amount and use.  Dr. Kriste Basque did tell us that he has not seen this patient in awhile.  He is unaware that he was hospitalized 3 weeks ago for a pulmonary embolism.  We will therefore ask for all the old records from [facility] in the last year, particularly in the last 3 weeks, to see if he was treated for a pulmonary embolism.  Would use the pharmacy protocol for Coumadin.  We are awaiting the records from [facility] ED.  At some point, we might have to ask for a medical consult with Dr. Kriste Basque.  It might be in the morning if we dont get the correct information.  We will check his vital signs q.i.d. and watch for symptoms of shortness of breath and chest pain. Again, we are in the process of getting the records from [facility].  ESTIMATED LENGTH OF STAY:  Three days. DD:  09/17/00 TD:  09/18/00 Job: [id number]

## 2010-12-27 NOTE — Discharge Summary (Signed)
[facility]  Patient:    Jeffrey Crane, Jeffrey Crane                        MRN: [id number] Adm. Date:  [id number] Disc. Date: 08/25/00 Attending:  Denny Peon                           Discharge Summary  DATE OF BIRTH:  [birth year]  FINAL DIAGNOSES:  1. Atypical chest pain likely musculoskeletal in etiology.  2. History of drug abuse including alcohol and cocaine.  3. Remote history of sarcoid.  4. History of hiatus hernia, peptic ulcer disease, previous Nissen     fundoplication.  5. History of irritable bowel syndrome with hemorrhoids.  6. History of remote appendectomy.  BRIEF HISTORY AND PHYSICAL:  The patient is a 50 year old gentleman whom I saw in the past with sarcoidosis but who has not been seen by me since 1993 and was in fact discharged from our practice in 1995. He has been getting all of his care and treatment through [facility] and the emergency rooms. He presented to the emergency room on August 23, 2000 with sharp chest pain and dyspnea. He was evaluated by the emergency room staff and Dr. Freida Busman and referred to Dr. Tama Gander for admission since he had seen me in the distant past. The acute history is one of onset of sharp left sided chest pain which was a knife-like stabbing pain while resting on the couch on the day of admission. He had trouble catching his breath and there appeared to be a pleuritic component. He has a history of significant crack cocaine use and had done this daily for several days prior to admission. There is evidence of multiple emergency room visits for drug abuse. His list of medications all written by [facility] and Dr. Betti Cruz the psychiatrist include trazodone 200 mg p.o. q.h.s., Proventil inhaler p.r.n. and theophylline unknown dose. He did not some cough and discolored sputum production and therefore was admitted for further evaluation and therapy.  PAST MEDICAL HISTORY:  Past history includes significant  drug abuse, alcohol abuse, and smoking. He is followed by Dr. Betti Cruz in psychiatry. He has a remote history of sarcoidosis in the 1980s with chest x-ray showing bilateral hilar adenopathy, bronchoscopy and transbronchial biopsy positive for non-caseating granulomas and he had an elevated ACE level at that time. He was treated with prednisone and gradually weaned off with clinical remission every since. There has been no evidence of any active disease through his last follow-up with me in 1993. He has a history of a large hiatus hernia with intractable peptic ulcer disease, had initially had Nissen fundoplication in 1987 by Dr. Wiliam Ke and subsequently had a subtotal gastrectomy and Roux-en-Y gastrojejunostomy in 1992. He also has a history of gastroparesis seen by Dr. Jarold Motto. He has history of irritable bowel syndrome with hemorrhoids. He had a remote appendectomy in 1985.  PHYSICAL EXAMINATION:  Physical examination at the time of admission by Dr. Tama Gander revealed a healthy appearing 50 year old gentleman in no acute distress. Blood pressure was 100/50, pulse 106 and regular, respirations 24 per minute and not labored. Temperature 98.3. HEENT exam is unremarkable. Chest exam was clear to auscultation and percussion. Cardiac exam revealed a regular rate and rhythm. No murmurs, rubs or gallops detected. Chest wall was very tender to palpation on the left side anteriorly and posteriorly. Abdomen revealed well healed scarred,  soft, nontender without evidence of organomegaly or masses.  Extremities showed no cyanosis, clubbing or edema. Neurologic exam was intact without focal abnormalities detected.  LABORATORY DATA:  EKG showed normal sinus rhythm and was felt to be within normal limits. Cardiac monitor revealed normal sinus rhythm and sinus bradycardia. There was no ectopy noted. Chest x-ray on admission was negative with no infiltrates or effusion. No evidence of any active process. CT  chest to rule out PTE was negative, no evidence of any pulmonary embolus or other acute abnormality. Blood gases on room air showed a pH of 7.39, pCO2 46, PO2 75. Hemoglobin 13.2, hematocrit 37.5, white count 7200, platelet count 128,000, sodium 141, potassium 3.6, chloride 113, CO2 23, BUN 6, creatinine 1.1, blood sugar 93. Calcium 8.4, total protein 5.7, albumin 3.4, AST 19, ALT 13, alkaline phosphatase 75, total bilirubin 0.4. CPK 174 with negative MB and negative troponin. Theophylline less than 2.0. Alcohol level 16.  HOSPITAL COURSE:  The patient was admitted by Dr. Tama Gander at the request of Dr. Freida Busman in the emergency room because he indicated that he had previously seen me although he had not been seen since 1993 and had been discharged from the practice in 1995. He was placed on Protonix, lovenox and given nebulizer treatments and Pulmicort inhaler. He was given Toradol for the pain and serial enzymes were ordered. He did receive a small dose of morphine for the severe pain. When I saw the patient his pain was much improved. We did a spiral CT to rule out the possibility of any pulmonary embolus especially with his history of crack cocaine use and question about other drug abuse. The scan returned negative. His pain was improved with the Toradol and he was felt to be MHP and ready for discharge on August 25, 2000.  DISCHARGE MEDICATIONS:  1. Tylenol two extra strength tablets every 4-6h as needed for pain.  2. He was instructed to use a heating pad and Myoflex Creme directly     to the chest wall for the area of discomfort.  3. He was encouraged to use Robitussin DM and plenty of fluids to help     with his cough.  He was instructed to discontinue smoking and discontinue all drug abuse including crack cocaine, etc. The patient will contact [facility] for medical follow-up and see his psychiatrist as soon as possible.  CONDITION ON DISCHARGE:  Improved.DD:  09/20/00 TD:   09/21/00 Job: [id number]

## 2010-12-27 NOTE — H&P (Signed)
NAME:  Jeffrey Crane, HAMMEN NO.:  [id number]   MEDICAL RECORD NO.:  [id number]                   PATIENT TYPE:  IPS   LOCATION:  0504                                 FACILITY:  [facility]   PHYSICIAN:  Jeanice Lim, M.D.              DATE OF BIRTH:  1960-08-23   DATE OF ADMISSION:  03/18/2003  DATE OF DISCHARGE:                         PSYCHIATRIC ADMISSION ASSESSMENT   IDENTIFYING INFORMATION:  Identifying information comes from the patient and  the chart.   REASON FOR ADMISSION AND SYMPTOMS:  The patient presented to the ER after  the police found him threatening to hang himself or jump from the bridge.  The patient was alert and oriented x4.  He denied homicidal ideation.  He  denied auditory or visual hallucinations.  He stated that he and his mother  are being evicted from their home due to the landlord renovating the house.  The patient has had difficulty finding any location and after thinking about  his situation became depressed and suicidal as his stressors are just too  much for the patient.  He also tended to his worry by drinking alcohol and  using cocaine.  His alcohol level in the ER was 284 and his urine drug  screen was positive for cocaine.  This is his normal pattern.  He was  admitted back in June, he was here June 22 to July 2, at which time he was  also using cocaine and drinking heavily and requested detoxification and  help with depression.   PAST PSYCHIATRIC HISTORY:  He has had numerous prior admissions here and  elsewhere.   SOCIAL HISTORY:  He finished the 9th grade.  He was married one time for 17  years.  He has no biological children, although he does have 3 stepchildren.  He states he worked at [employer] prior to becoming disabled.  This was secondary to bleeding ulcers and emphysema.   FAMILY HISTORY:  He has an aunt who has depression.   ALCOHOL AND DRUG ABUSE:  He states he relapsed on beer 1 week ago.   PAST MEDICAL HISTORY:  Primary care Billye Nydam is through [facility].  Medical problems are emphysema.  He takes Combivent inhaler to help him with  this.  Other daily medications are his discharge medications from July, one  month ago which were Lexapro 10 mg daily, Wellbutrin XL 150 mg in the  morning, Seroquel 250 mg at bedtime.   ALLERGIES:  No known drug allergies.   POSITIVE PHYSICAL FINDINGS:  He has abdominal scars consistent with stomach  surgery in 1992 for bleeding ulcers, and a hernia repair in 1993.  He has  numerous scratches and he [place]   MENTAL STATUS EXAM:  He  is alert and oriented x3.  He is disheveled;  however he is cooperative and his behavior is  goal directed.  His speech has  a normal rate, rhythm and tone.  His mood is depressed.  His thought process  is clear and rational.  He is goal oriented.  His judgment and insight are  intact.  He knows he would be taking the coward's way by contemplating  suicide.  Concentration and memory are intact.  His intelligence is average  to below average.   ADMISSION DIAGNOSES:    AXIS I:  1. Cocaine and alcohol abuse rule out dependence.  2. 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   AXIS II:  No diagnosis.   AXIS III:  Emphysema, history for sarcoidosis and partial gastrectomy.   AXIS IV:  Moderate.   AXIS V:  Global assessment of function is 30.   PLAN:  To support through withdrawal, to reinstitute antidepressant  treatment, to help identify further social support and new housing for both  him and his mother, and back in July he was to have attended the chemical  dependency IOP.  We will try to have that again.      Vic Ripper, P.A.-C.               Jeanice Lim, M.D.    MD/MEDQ  D:  03/18/2003  T:  03/18/2003  Job:  [id number]

## 2010-12-27 NOTE — Op Note (Signed)
NAME:  Jeffrey Crane, Jeffrey Crane NO.:  [id number]   MEDICAL RECORD NO.:  [id number]          PATIENT TYPE:  OUT   LOCATION:  XRAY                         [facility]   PHYSICIAN:  Shirley Friar, MDDATE OF BIRTH:  July 05, 1961   DATE OF PROCEDURE:  06/19/2005  DATE OF DISCHARGE:  06/13/2005                                 OPERATIVE REPORT   INDICATION:  Abdominal pain, had a normal upper GI series.   ANESTHESIA:  Demerol 60 mg IV, Versed 6 mg IV, Cetacaine spray.   FINDINGS:  The endoscope was inserted into the oropharynx and passed down  through a normal-appearing esophagus into the gastric remnant.  On careful  inspection, the gastric remnant appeared to have a normal postoperative  appearance with a Billroth II anastomosis noted.  The endoscope was advanced  down into the gastrojejunostomy, which was patent without any anastomotic  ulcers noted.  The endoscope was then advanced down into the blind end of  the Roux jejunostomy, which appeared normal as well.  Retroflexion was done  in the gastric remnant and it revealed a small fundoplication defect versus  a small diverticulum.  No mass or ulcer was noted in the gastroesophageal  junction area and no masses or ulcer was noted in the entire examined  stomach and small bowel.  The endoscope was withdrawn and confirmed the  above findings.   ASSESSMENT:  1.  Normal postoperative Billroth II anastomosis.  2.  Possible small fundoplication defect versus small diverticulum above the      fundoplication.  3.  No mass and no ulcer noted.   PLAN:  1.  Will give a trial of Reglan 10 mg q. a.c. and q.h.s. for the patient's      documented decreased motility of his gastric remnant and small bowel      (gastroparesis noted on upper GI series, as well as atony of the      proximal small bowel).  2.  Recommend the patient to ingest small frequent low fat meals.  3.  Avoid NSAID's.  4.  Followup in office as  needed.      Shirley Friar, MD  Electronically Signed     VCS/MEDQ  D:  06/19/2005  T:  06/19/2005  Job:  [id number]   cc:   Tresa Endo L. Philipp Deputy, M.D.  Fax: [phone]

## 2010-12-27 NOTE — Assessment & Plan Note (Signed)
Jeffrey Crane is a 50 year old gentleman who is returning for a recheck, to  our pain and rehabilitative clinic.  He has had a chief complaint of  left lower extremity pain on October 26, 2006, when he was seen for the  first time in our clinic.   He is a patient of Dr. Clarene Duke.   In the interim, Mr. Maddocks was going to bring some records from [facility].  Apparently, he has had a myelogram done; however, he did not  bring it today.   He was placed on Lyrica, up to 3 times a day.  Last month, he states  that overall this has helped quite a bit.  At the last visit, his leg  and arm pain were his chief complaint; however, his leg pain was about  an 8.  On a scale of 10, his right arm pain was a 5.  Today, he reports  his arm pain is about a 3 on a scale of 10, and his leg pain is a 2 on a  scale of 10.   CHIEF COMPLAINT:  New problems:  Some right flank pain, which is  associated with some shortness of breath and discomfort when he takes a  deep breath.  This began about 2 days ago, and he has an appointment  today with Dr. Clarene Duke, his primary care physician who he will see after  he is seen in our clinic.   His previous pain complaints have overall improved.  He does have some  continued low back pain and intrascapular pain; however, these are not  his main complaints today.   He continues to be independent with his self care, admits to some  nausea, as well as the shortness of breath and discomfort with deep  breathing.   He continues to smoke a pack of cigarettes a day.   He is receiving only Lyrica from our clinic, 50 mg t.i.d.   EXAMINATION:  VITAL SIGNS:  Blood pressure is 107/65, pulse 78,  respirations 16, 96% saturate on room air.  GENERAL:  Well-developed, well-nourished gentleman who appears a bit  older than his stated age.  He is oriented x3.  His speech is clear,  affect bright, alert, cooperative, pleasant.  He does not seem in any  distress.   He transitions  from sitting to standing, without difficulty.  Gait is  stable in the room, with a normal gait cycle.   Tandem gait and Romberg's test are performed adequately.   Some limitations are noted in lumbar range of motion, in all plains.   Reflexes are zero, with the patellar tendon zero.  At the Achilles'  tendons, he reports no sensory deficits in the lower extremities.  Motor  strength is 5/5.  Straight leg raise is negative.   IMPRESSION:  1. Overall improvement in left lower extremity pain with Lyrica.  2. Status post anterior cervical fusion, C6-7, April 2007, Dr. Manson Passey.  3. History of ETOH abuse.  4. History of drug abuse.  Currently states has not used drugs for      approximately one year.  E-chart notes history of sarcoidosis.  5. History of anxiety and depressive disorder.  6. Other medical problems include diabetes, chronic obstructive      pulmonary disease, steroid use for COPD, osteopenia and status post      gastrectomy in 1992.   PLAN:  Will hold off on any interventions for now.  Encourage him to  follow up with Dr.  Little, his PCP for this new right flank pain, which  is associated with discomfort with a deep breath and some nausea and  some shortness of breath.  Will see him back in 3 weeks.  May consider  medial branch blocks for continued low back pain.  However, will need to  re-evaluate him after this new flank pain is resolved.           ______________________________  Brantley Stage, M.D.     DMK/MedQ  D:  11/23/2006 11:25:31  T:  11/23/2006 11:59:02  Job #:  [id number]

## 2010-12-27 NOTE — Assessment & Plan Note (Signed)
Jeffrey Crane is a 50 year old gentleman returning for a recheck.  He is  being seen in our Pain and Rehabilitative Clinic predominantly for left  lower extremity pain and low back pain.   He is a patient of Dr. Clarene Duke who is his PCP.   In the interim, Jeffrey Crane states that he is following up with multiple  providers at this time including Dr. __________ who is a Scientist, research (physical sciences), Dr. Clarene Duke PCP, Dr. Manson Passey neurosurgeon and he has an  orthopedist who he cannot remember the name of who in the past has done  arthroscopic surgery on his left knee.   He states that he has had another CT scan.   He does not have any results regarding that scan.   He states that the Lyrica is not helping significantly.  Average pain is  about an 8 on a scale of 10.  He reports little relief with the current  meds.  The pain is described as constant, tingling, sharp, burning and  aching with low back pain and into the left lower extremity.  He is  independent with his self care.  He denies problems with controlling  bowel and bladder.  Denies suicidal ideation.   REVIEW OF SYSTEMS:  Otherwise negative.   PAST MEDICAL HISTORY:  Otherwise negative.   SOCIAL HISTORY:  Otherwise negative.   FAMILY HISTORY:  Otherwise negative.   Exam today:  Blood pressure is 111/74, pulse is 87, respirations 16, 98%  saturated on room air.  He is a well developed, well nourished gentleman  who appears older than his stated age.  He is oriented x3.  His affect  is alert, cooperative and pleasant.   Gait:  He is able to transition from sit-to-stand easily.  Gait in the  room is normal.  Some limitation is noted in forward flexion and  extension, mildly so.  No pain at end range with each of these  maneuvers.   Romberg's test and tandem gait are performed adequately.   Lower extremities have full strength.  Reflexes are 1+ at the patellar  tendon, zero at the Achilles tendon, 2+ at the left Achilles tendon.  Left knee was  evaluated and no AP or lateral instability is appreciated.  He may have a small effusion.  He has some increased pain with full  extension and he has crepitus with flexion and extension over the  patella and he has medial joint line tenderness more so than lateral.   IMPRESSION:  1. Lumbar spondylosis, mild facet arthropathy at multiple levels per      MRI dated June 20, 2006.  The patient apparently has had a      myelogram in April.  I do not have these results.  He also has had      a CT scan within the last week.  I do not have those results.  2. Status post anterior cervical fusion C6-15 November 2005 by Dr. Manson Passey.  3. History of left knee pain, status post arthroscopic surgery now      with increased knee pain again, currently following up with an      orthopedist within this month.  4. History of alcohol abuse.  5. History of drug abuse.  6. History of anxiety/depressive disorder.  7. Other medical problems include diabetes, chronic obstructive      pulmonary disease, steroid use for chronic obstructive pulmonary      disease, osteopenia, status post gastrectomy in 1992.   He  also mentioned that he is going to undergo and endoscopic procedure  by Dr. __________ for possible ulcers.   We will trial him on Lidoderm for his left knee and neck.  Nursing staff  will teach him how to use this medication.  We will increase his Lyrica  slightly from 50 mg 3 times a day to 50 mg 2 tablets twice a day.  Await  studies which have been done within the last month, apparently there is  a myelogram as well as CT scan which I have not been able to see.  May  consider bracing to unload his knee at the next visit.  We will see him  back in a month.           ______________________________  Brantley Stage, M.D.     DMK/MedQ  D:  12/14/2006 10:48:21  T:  12/14/2006 11:18:33  Job #:  [id number]

## 2011-05-05 LAB — COMPREHENSIVE METABOLIC PANEL
AST: 37
Albumin: 3.4 — ABNORMAL LOW
BUN: 11
Calcium: 8.4
Creatinine, Ser: 0.88
GFR calc Af Amer: >60

## 2011-05-05 LAB — RAPID URINE DRUG SCREEN, HOSP PERFORMED
Amphetamines: NOT DETECTED
Benzodiazepines: POSITIVE — AB
Cocaine: NOT DETECTED
Opiates: POSITIVE — AB
Tetrahydrocannabinol: NOT DETECTED

## 2011-05-05 LAB — URINALYSIS, ROUTINE W REFLEX MICROSCOPIC
Bilirubin Urine: NEGATIVE
Ketones, ur: NEGATIVE
Nitrite: NEGATIVE
Protein, ur: NEGATIVE
Specific Gravity, Urine: 1.018
Urobilinogen, UA: 1

## 2011-05-05 LAB — CBC
HCT: 37.8 — ABNORMAL LOW
MCHC: 34.3
MCV: 78.4
Platelets: 192
RDW: 15.3

## 2011-05-23 LAB — COMPREHENSIVE METABOLIC PANEL
AST: 38 — ABNORMAL HIGH
BUN: 10
CO2: 24
Calcium: 8.6
Chloride: 104
Creatinine, Ser: 0.81
GFR calc Af Amer: >60
GFR calc non Af Amer: >60
Glucose, Bld: 105 — ABNORMAL HIGH
Total Bilirubin: 0.7

## 2011-05-23 LAB — CBC
HCT: 44.1
MCHC: 34.4
MCV: 84.8
RBC: 5.2
WBC: 7.5

## 2011-05-23 LAB — RAPID URINE DRUG SCREEN, HOSP PERFORMED
Amphetamines: NOT DETECTED
Barbiturates: NOT DETECTED
Benzodiazepines: NOT DETECTED
Opiates: NOT DETECTED

## 2011-05-23 LAB — URINALYSIS, ROUTINE W REFLEX MICROSCOPIC
Glucose, UA: NEGATIVE
Hgb urine dipstick: NEGATIVE
Specific Gravity, Urine: 1.016
Urobilinogen, UA: 0.2

## 2011-05-23 LAB — DIFFERENTIAL
Basophils Absolute: 0
Eosinophils Relative: 1
Lymphocytes Relative: 38
Lymphs Abs: 2.8
Neutro Abs: 4.1
Neutrophils Relative %: 55

## 2011-05-26 LAB — COMPREHENSIVE METABOLIC PANEL
ALT: 19
Alkaline Phosphatase: 76
BUN: 11
Chloride: 106
Glucose, Bld: 90
Potassium: 4.1
Sodium: 141
Total Bilirubin: 0.7
Total Protein: 6.4

## 2011-05-26 LAB — CBC
HCT: 44.4
Hemoglobin: 14.5
Hemoglobin: 15.3
MCHC: 34.7
MCV: 84.7
RBC: 4.92
RBC: 5.21
RDW: 14.5 — ABNORMAL HIGH
WBC: 11.9 — ABNORMAL HIGH

## 2011-05-26 LAB — DIFFERENTIAL
Basophils Absolute: 0.1
Basophils Relative: 1
Eosinophils Absolute: 0.1
Monocytes Absolute: 0.6
Monocytes Relative: 5
Neutrophils Relative %: 61

## 2011-05-26 LAB — ETHANOL: Alcohol, Ethyl (B): 222 — ABNORMAL HIGH

## 2011-05-26 LAB — BASIC METABOLIC PANEL
CO2: 20
Chloride: 105
GFR calc Af Amer: >60
Glucose, Bld: 123 — ABNORMAL HIGH
Sodium: 136

## 2011-05-26 LAB — METHYLMALONIC ACID, SERUM: Methylmalonic Acid, Quantitative: 251 nmol/L (ref 87–318)

## 2011-05-26 LAB — POCT CARDIAC MARKERS: Operator id: 4761

## 2011-05-26 LAB — LIPID PANEL
LDL Cholesterol: 75
LDL Cholesterol: 98
VLDL: 13
VLDL: 9

## 2011-05-26 LAB — TSH: TSH: 1.147

## 2011-05-26 LAB — HEMOGLOBIN A1C: Hgb A1c MFr Bld: 5.1

## 2011-05-26 LAB — SEDIMENTATION RATE: Sed Rate: 2

## 2011-07-17 ENCOUNTER — Other Ambulatory Visit: Payer: Self-pay | Admitting: Neurosurgery

## 2011-07-17 DIAGNOSIS — M545 Low back pain, unspecified: Secondary | ICD-10-CM

## 2011-07-17 DIAGNOSIS — M79604 Pain in right leg: Secondary | ICD-10-CM

## 2011-07-28 ENCOUNTER — Ambulatory Visit
Admission: RE | Admit: 2011-07-28 | Discharge: 2011-07-28 | Disposition: A | Discharge status: Home / Self Care | Payer: Medicare Other | Source: Ambulatory Visit | Attending: Neurosurgery | Admitting: Neurosurgery

## 2011-07-28 DIAGNOSIS — M79605 Pain in left leg: Secondary | ICD-10-CM

## 2011-07-28 DIAGNOSIS — M545 Low back pain, unspecified: Secondary | ICD-10-CM

## 2011-07-28 DIAGNOSIS — M79604 Pain in right leg: Secondary | ICD-10-CM

## 2011-07-28 MED ORDER — GADOBENATE DIMEGLUMINE 529 MG/ML IV SOLN
15.0000 mL | Freq: Once | INTRAVENOUS | Status: AC | PRN
Start: 2011-07-28 — End: 2011-07-28
  Administered 2011-07-28: 15 mL via INTRAVENOUS

## 2011-11-03 ENCOUNTER — Other Ambulatory Visit: Payer: Self-pay | Admitting: Neurosurgery

## 2011-11-03 DIAGNOSIS — M542 Cervicalgia: Secondary | ICD-10-CM

## 2011-11-05 ENCOUNTER — Ambulatory Visit
Admission: RE | Admit: 2011-11-05 | Discharge: 2011-11-05 | Disposition: A | Discharge status: Home / Self Care | Payer: Medicare Other | Source: Ambulatory Visit | Attending: Neurosurgery | Admitting: Neurosurgery

## 2011-11-05 DIAGNOSIS — M542 Cervicalgia: Secondary | ICD-10-CM

## 2011-11-23 ENCOUNTER — Encounter (HOSPITAL_COMMUNITY): Payer: Self-pay | Admitting: *Deleted

## 2011-11-23 ENCOUNTER — Emergency Department (HOSPITAL_COMMUNITY): Payer: Medicare Other

## 2011-11-23 ENCOUNTER — Observation Stay (HOSPITAL_COMMUNITY)
Admission: EM | Admit: 2011-11-23 | Discharge: 2011-11-24 | Discharge status: Against Medical Advice | Payer: Medicare Other | Attending: Internal Medicine | Admitting: Internal Medicine

## 2011-11-23 DIAGNOSIS — J449 Chronic obstructive pulmonary disease, unspecified: Secondary | ICD-10-CM | POA: Insufficient documentation

## 2011-11-23 DIAGNOSIS — K219 Gastro-esophageal reflux disease without esophagitis: Secondary | ICD-10-CM | POA: Insufficient documentation

## 2011-11-23 DIAGNOSIS — J4489 Other specified chronic obstructive pulmonary disease: Secondary | ICD-10-CM | POA: Insufficient documentation

## 2011-11-23 DIAGNOSIS — R002 Palpitations: Secondary | ICD-10-CM | POA: Diagnosis present

## 2011-11-23 DIAGNOSIS — R000 Tachycardia, unspecified: Secondary | ICD-10-CM

## 2011-11-23 DIAGNOSIS — R079 Chest pain, unspecified: Principal | ICD-10-CM | POA: Diagnosis present

## 2011-11-23 DIAGNOSIS — G894 Chronic pain syndrome: Secondary | ICD-10-CM | POA: Insufficient documentation

## 2011-11-23 HISTORY — DX: Hypoglycemia, unspecified: E16.2

## 2011-11-23 HISTORY — DX: Gastro-esophageal reflux disease without esophagitis: K21.9

## 2011-11-23 HISTORY — DX: Chronic obstructive pulmonary disease, unspecified: J44.9

## 2011-11-23 HISTORY — DX: Gastrointestinal hemorrhage, unspecified: K92.2

## 2011-11-23 LAB — POCT I-STAT, CHEM 8
HCT: 35 % — ABNORMAL LOW (ref 39.0–52.0)
Hemoglobin: 11.9 g/dL — ABNORMAL LOW (ref 13.0–17.0)
Potassium: 3.9 mEq/L (ref 3.5–5.1)
Sodium: 142 mEq/L (ref 135–145)
TCO2: 24 mmol/L (ref 0–100)

## 2011-11-23 LAB — POCT I-STAT TROPONIN I

## 2011-11-23 MED ORDER — IPRATROPIUM BROMIDE 0.02 % IN SOLN
0.5000 mg | Freq: Once | RESPIRATORY_TRACT | Status: AC
  Administered 2011-11-23: 0.5 mg via RESPIRATORY_TRACT
  Filled 2011-11-23 (×2): qty 2.5

## 2011-11-23 MED ORDER — MORPHINE SULFATE 4 MG/ML IJ SOLN
4.0000 mg | Freq: Once | INTRAMUSCULAR | Status: DC
  Administered 2011-11-23: 4 mg via INTRAVENOUS

## 2011-11-23 MED ORDER — ALBUTEROL SULFATE (5 MG/ML) 0.5% IN NEBU
5.0000 mg | INHALATION_SOLUTION | Freq: Once | RESPIRATORY_TRACT | Status: AC
  Administered 2011-11-23: 5 mg via RESPIRATORY_TRACT
  Filled 2011-11-23: qty 1

## 2011-11-23 MED ORDER — MORPHINE SULFATE 4 MG/ML IJ SOLN
4.0000 mg | Freq: Once | INTRAMUSCULAR | Status: AC
  Administered 2011-11-24: 4 mg via INTRAVENOUS
  Filled 2011-11-23: qty 1

## 2011-11-23 MED ORDER — MORPHINE SULFATE 4 MG/ML IJ SOLN
INTRAMUSCULAR | Status: AC
  Administered 2011-11-23: 4 mg via INTRAVENOUS
  Filled 2011-11-23: qty 1

## 2011-11-23 NOTE — H&P (Signed)
PCP: Dr. Oneal Deputy  Chief Complaint: Palpitations and transient chest pain   HPI: Jeffrey Crane is an 51 y.o. male with history of chronic back and neck pain on chronic narcotic (Percocet 10 mg 5 times daily along with Dilaudid 2 mg to 3 times daily as needed) history of knee surgery, COPD, prior tobacco use, brought into the emergency room as his family noted the finger pulse oximetry show heart rate of 200 a few seconds, accompanied with transient chest pain. It happened only today. He denied any sustained substernal chest pain or pressure, lightheadedness, shortness of breath, nausea and vomiting or diaphoresis. Evaluation in emergency room showed normal sinus rhythm at 70, negative cardiac markers, unremarkable electrolytes, and a clear chest x-ray. Hospitalist was asked to admit patient for cardiac rule out and to monitor his rhythm.   Review of Systems:  The patient denies anorexia, fever, weight loss,, vision loss, decreased hearing, hoarseness, syncope, dyspnea on exertion, peripheral edema, balance deficits, hemoptysis, abdominal pain, melena, hematochezia, severe indigestion/heartburn, hematuria, incontinence, genital sores, muscle weakness, suspicious skin lesions, transient blindness, difficulty walking, depression, unusual weight change, abnormal bleeding, enlarged lymph nodes, angioedema, and breast masses.    Past Medical History  Diagnosis Date  . COPD (chronic obstructive pulmonary disease)   . GI bleeding   . Hypoglycemia   . Acid reflux     Past Surgical History  Procedure Date  . Gastrectomy   . Hernia repair   . Back surgery   . Abdominal surgery   . Carpal tunnel release   . Shoulder surgery   . Knee surgery   . Cervical fusion   . Ulnar nerve repair     Medications:  HOME MEDS: Prior to Admission medications   Medication Sig Start Date End Date Taking? Authorizing Provider  albuterol (ACCUNEB) 1.25 MG/3ML nebulizer solution Take 1 ampule by nebulization  every 6 (six) hours as needed. For shortness of breath   Yes Historical Provider, MD  albuterol (PROVENTIL HFA;VENTOLIN HFA) 108 (90 BASE) MCG/ACT inhaler Inhale 2 puffs into the lungs every 6 (six) hours as needed. For shortness of breath   Yes Historical Provider, MD  cyclobenzaprine (FLEXERIL) 10 MG tablet Take 10 mg by mouth 3 (three) times daily as needed. For muscle spasm   Yes Historical Provider, MD  esomeprazole (NEXIUM) 40 MG capsule Take 40 mg by mouth daily before breakfast.   Yes Historical Provider, MD  fluticasone (FLONASE) 50 MCG/ACT nasal spray Place 2 sprays into the nose 2 (two) times daily.   Yes Historical Provider, MD  gabapentin (NEURONTIN) 300 MG capsule Take 300 mg by mouth daily.   Yes Historical Provider, MD  HYDROcodone-acetaminophen (NORCO) 10-325 MG per tablet Take 1 tablet by mouth every 6 (six) hours as needed. For pain   Yes Historical Provider, MD  HYDROmorphone (DILAUDID) 2 MG tablet Take 2 mg by mouth every 4 (four) hours as needed.   Yes Historical Provider, MD  ipratropium (ATROVENT) 0.02 % nebulizer solution Take 500 mcg by nebulization 4 (four) times daily.   Yes Historical Provider, MD  LORazepam (ATIVAN) 0.5 MG tablet Take 0.5 mg by mouth every 8 (eight) hours. For anxiety   Yes Historical Provider, MD  metoCLOPramide (REGLAN) 10 MG tablet Take 10 mg by mouth 3 (three) times daily.   Yes Historical Provider, MD  promethazine (PHENERGAN) 25 MG tablet Take 25 mg by mouth every 6 (six) hours as needed. For nausea   Yes Historical Provider, MD  tiotropium [facility])  18 MCG inhalation capsule Place 18 mcg into inhaler and inhale daily.   Yes Historical Provider, MD     Allergies:  Allergies  Allergen Reactions  . Aspirin Other (See Comments)    Bleeding ulcer  . Avelox (Moxifloxacin Hcl In Nacl) Hives  . Chloraprep One Step Hypertension    Social History:   reports that he quit smoking about 2 years ago. He does not have any smokeless tobacco history on  file. He reports that he does not drink alcohol or use illicit drugs.  Family History: No family history on file.   Physical Exam: Filed Vitals:   11/23/11 2021 11/23/11 2054 11/23/11 2200 11/23/11 2230  BP: 115/73 103/69 101/64 95/46  Pulse: 86 69 65 66  Temp: 97.3 F (36.3 C) 97.6 F (36.4 C)    TempSrc: Oral Oral    Resp: 18 22 27    Height:  5\' 6"  (1.676 m)    Weight:  79.379 kg (175 lb)    SpO2: 97% 99% 96% 99%   Blood pressure 95/46, pulse 66, temperature 97.6 F (36.4 C), temperature source Oral, resp. rate 27, height 5\' 6"  (1.676 m), weight 79.379 kg (175 lb), SpO2 99.00%.  GEN:  Pleasant person lying in the stretcher in no acute distress; cooperative with exam PSYCH:  alert and oriented x4; does not appear anxious or depressed; affect is appropriate. HEENT: Mucous membranes pink and anicteric; PERRLA; EOM intact; no cervical lymphadenopathy nor thyromegaly or carotid bruit; no JVD; Breasts:: Not examined CHEST WALL: No tenderness CHEST: Normal respiration, clear to auscultation bilaterally HEART: Regular rate and rhythm; no murmurs rubs or gallops BACK: No kyphosis or scoliosis; no CVA tenderness ABDOMEN: Obese, soft non-tender; no masses, no organomegaly, normal abdominal bowel sounds; no pannus; no intertriginous candida. Rectal Exam: Not done EXTREMITIES: No bone or joint deformity; age-appropriate arthropathy of the hands and knees; no edema; no ulcerations. Genitalia: not examined PULSES: 2+ and symmetric SKIN: Normal hydration no rash or ulceration CNS: Cranial nerves 2-12 grossly intact no focal lateralizing neurologic deficit   Labs & Imaging Results for orders placed during the hospital encounter of 11/23/11 (from the past 48 hour(s))  POCT I-STAT TROPONIN I     Status: Normal   Collection Time   11/23/11  9:08 PM      Component Value Range Comment   Troponin i, poc 0.02  0.00 - 0.08 (ng/mL)    Comment 3            POCT I-STAT, CHEM 8     Status:  Abnormal   Collection Time   11/23/11  9:09 PM      Component Value Range Comment   Sodium 142  135 - 145 (mEq/L)    Potassium 3.9  3.5 - 5.1 (mEq/L)    Chloride 109  96 - 112 (mEq/L)    BUN 15  6 - 23 (mg/dL)    Creatinine, Ser 0.98  0.50 - 1.35 (mg/dL)    Glucose, Bld 94  70 - 99 (mg/dL)    Calcium, Ion 1.19  1.12 - 1.32 (mmol/L)    TCO2 24  0 - 100 (mmol/L)    Hemoglobin 11.9 (*) 13.0 - 17.0 (g/dL)    HCT 14.7 (*) 82.9 - 52.0 (%)    Dg Chest 2 View  11/23/2011  *RADIOLOGY REPORT*  Clinical Data: Chest pain and shortness of breath; history of smoking.  CHEST - 2 VIEW  Comparison: Chest radiograph performed 10/02/2007  Findings: The lungs are  well-aerated and clear.  There is no evidence of focal opacification, pleural effusion or pneumothorax.  The heart is normal in size; the mediastinal contour is within normal limits.  No acute osseous abnormalities are seen.  Cervical spinal fusion hardware is noted.  There appears to have been interval resection of the distal right clavicle.  IMPRESSION: No acute cardiopulmonary process seen.  Original Report Authenticated By: Tonia Ghent, M.D.      Assessment Present on Admission:  .Chest pain at rest .Palpitation .Chronic pain syndrome  PLAN:The pulse oxymetry may not be reliable in determining his heart rate of 200 , especially when it was so transient. Will admit him for monitoring. Will rule out for his chest pain with serial CPKs and troponins. I will continue his home pain medication, and will only give 0.5 mg of Dilaudid supplement as needed.he is otherwise stable, full code, and will be admitted to triad hospitalist service. His other medical problems are stable, and I will continue his medications     Other plans as per orders.   Sharena Dibenedetto 11/23/2011, 10:54 PM

## 2011-11-23 NOTE — ED Notes (Signed)
Pt to xray, no changes, calm, alert, NAD, interactive, skin W&D.

## 2011-11-23 NOTE — ED Notes (Addendum)
Here by EMS from home, here for CP, onset ~ 2 weeks ago, intermttant, also mentions fatigue and nausea. (Denies: vd, sob, diaphoresis), VSS, NSR on monitor, significant GI hx with surgery (bleeding ulcers and partial gastrectomy), also had L knee surgey  Thursday, also had ntg x3 PTA with reduction in pain, 10/10 down to 5/10. Pt pinpoints pain to LUQ.

## 2011-11-23 NOTE — ED Notes (Signed)
Back from xray, family x2 at [facility].

## 2011-11-23 NOTE — ED Notes (Signed)
3700 unable to take report.

## 2011-11-23 NOTE — ED Provider Notes (Signed)
History     CSN: [id number]  Arrival date & time 11/23/11  2014   First MD Initiated Contact with Patient 11/23/11 2027      Chief Complaint  Patient presents with  . Chest Pain     HPI Here by EMS from home, here for CP, onset ~ 2 weeks ago, intermttant, also mentions fatigue and nausea. (Denies: vd, sob, diaphoresis), VSS, NSR on monitor, significant GI hx with surgery (bleeding ulcers and partial gastrectomy), also had L knee surgey Thursday, also had ntg x3 PTA with reduction in pain, 10/10 down to 5/10  Past Medical History  Diagnosis Date  . COPD (chronic obstructive pulmonary disease)   . GI bleeding   . Hypoglycemia   . Acid reflux     Past Surgical History  Procedure Date  . Gastrectomy   . Hernia repair   . Back surgery   . Abdominal surgery   . Carpal tunnel release   . Shoulder surgery   . Knee surgery   . Cervical fusion   . Ulnar nerve repair     No family history on file.  History  Substance Use Topics  . Smoking status: Former Smoker    Quit date: 11/22/2009  . Smokeless tobacco: Not on file  . Alcohol Use: No      Review of Systems  All other systems reviewed and are negative.    Allergies  Aspirin; Avelox; and Chloraprep one step  Home Medications   Current Outpatient Rx  Name Route Sig Dispense Refill  . ALBUTEROL SULFATE 1.25 MG/3ML IN NEBU Nebulization Take 1 ampule by nebulization every 6 (six) hours as needed. For shortness of breath    . ALBUTEROL SULFATE HFA 108 (90 BASE) MCG/ACT IN AERS Inhalation Inhale 2 puffs into the lungs every 6 (six) hours as needed. For shortness of breath    . CYCLOBENZAPRINE HCL 10 MG PO TABS Oral Take 10 mg by mouth 3 (three) times daily as needed. For muscle spasm    . ESOMEPRAZOLE MAGNESIUM 40 MG PO CPDR Oral Take 40 mg by mouth daily before breakfast.    . FLUTICASONE PROPIONATE 50 MCG/ACT NA SUSP Nasal Place 2 sprays into the nose 2 (two) times daily.    Marland Kitchen GABAPENTIN 300 MG PO CAPS Oral Take  300 mg by mouth daily.    Marland Kitchen HYDROCODONE-ACETAMINOPHEN 10-325 MG PO TABS Oral Take 1 tablet by mouth every 6 (six) hours as needed. For pain    . HYDROMORPHONE HCL 2 MG PO TABS Oral Take 2 mg by mouth every 4 (four) hours as needed.    . IPRATROPIUM BROMIDE 0.02 % IN SOLN Nebulization Take 500 mcg by nebulization 4 (four) times daily.    Marland Kitchen LORAZEPAM 0.5 MG PO TABS Oral Take 0.5 mg by mouth every 8 (eight) hours. For anxiety    . METOCLOPRAMIDE HCL 10 MG PO TABS Oral Take 10 mg by mouth 3 (three) times daily.    Marland Kitchen PROMETHAZINE HCL 25 MG PO TABS Oral Take 25 mg by mouth every 6 (six) hours as needed. For nausea    . TIOTROPIUM BROMIDE MONOHYDRATE 18 MCG IN CAPS Inhalation Place 18 mcg into inhaler and inhale daily.      BP 101/64  Pulse 65  Temp(Src) 97.6 F (36.4 C) (Oral)  Resp 27  Ht 5\' 6"  (1.676 m)  Wt 175 lb (79.379 kg)  BMI 28.25 kg/m2  SpO2 96%  Physical Exam  Nursing note and vitals reviewed. Constitutional: He  is oriented to person, place, and time. He appears well-developed and well-nourished. No distress.  HENT:  Head: Normocephalic and atraumatic.  Eyes: Pupils are equal, round, and reactive to light.  Neck: Normal range of motion.  Cardiovascular: Normal rate and intact distal pulses.         Date: 11/23/2011  Rate: 77  Rhythm: normal sinus rhythm  QRS Axis: normal  Intervals: normal  ST/T Wave abnormalities: normal  Conduction Disutrbances: none  Narrative Interpretation: unremarkable      Pulmonary/Chest: No respiratory distress. He has no wheezes. He has no rales.  Abdominal: Normal appearance. He exhibits no distension. There is no tenderness. There is no rebound.  Musculoskeletal: Normal range of motion.  Neurological: He is alert and oriented to person, place, and time. No cranial nerve deficit.  Skin: Skin is warm and dry. No rash noted.  Psychiatric: He has a normal mood and affect. His behavior is normal.    ED Course  Procedures (including critical  care time)  Labs Reviewed  POCT I-STAT, CHEM 8 - Abnormal; Notable for the following:    Hemoglobin 11.9 (*)    HCT 35.0 (*)    All other components within normal limits  POCT I-STAT TROPONIN I   Dg Chest 2 View  11/23/2011  *RADIOLOGY REPORT*  Clinical Data: Chest pain and shortness of breath; history of smoking.  CHEST - 2 VIEW  Comparison: Chest radiograph performed 10/02/2007  Findings: The lungs are well-aerated and clear.  There is no evidence of focal opacification, pleural effusion or pneumothorax.  The heart is normal in size; the mediastinal contour is within normal limits.  No acute osseous abnormalities are seen.  Cervical spinal fusion hardware is noted.  There appears to have been interval resection of the distal right clavicle.  IMPRESSION: No acute cardiopulmonary process seen.  Original Report Authenticated By: Tonia Ghent, M.D.     1. Chest pain   2. Tachycardia       MDM         Nelia Shi, MD 11/23/11 2218

## 2011-11-23 NOTE — ED Notes (Signed)
Dr. Conley Rolls (admitting MD) at [facility].

## 2011-11-23 NOTE — ED Notes (Signed)
EDP at [facility] in h/w.

## 2011-11-23 NOTE — ED Notes (Signed)
Lab finished at [facility] 

## 2011-11-23 NOTE — ED Notes (Signed)
Pt moved to room #3.

## 2011-11-24 LAB — BASIC METABOLIC PANEL
Calcium: 8.4 mg/dL (ref 8.4–10.5)
Chloride: 108 mEq/L (ref 96–112)
Creatinine, Ser: 0.89 mg/dL (ref 0.50–1.35)
GFR calc Af Amer: >90 mL/min (ref >90)
Sodium: 138 mEq/L (ref 135–145)

## 2011-11-24 LAB — CBC
HCT: 33.1 % — ABNORMAL LOW (ref 39.0–52.0)
Platelets: 180 10*3/uL (ref 150–400)
RBC: 4.44 MIL/uL (ref 4.22–5.81)
RDW: 16.9 % — ABNORMAL HIGH (ref 11.5–15.5)
WBC: 4.6 10*3/uL (ref 4.0–10.5)

## 2011-11-24 LAB — CARDIAC PANEL(CRET KIN+CKTOT+MB+TROPI)
CK, MB: 1.6 ng/mL (ref 0.3–4.0)
Troponin I: <0.3 ng/mL (ref <0.30)

## 2011-11-24 MED ORDER — HYDROMORPHONE HCL PF 1 MG/ML IJ SOLN
INTRAMUSCULAR | Status: AC
  Administered 2011-11-24: 0.5 mg via INTRAVENOUS
  Filled 2011-11-24: qty 1

## 2011-11-24 MED ORDER — LORAZEPAM 0.5 MG PO TABS
0.5000 mg | ORAL_TABLET | Freq: Three times a day (TID) | ORAL | Status: DC
Start: 2011-11-24 — End: 2011-11-24
  Administered 2011-11-24: 0.5 mg via ORAL
  Filled 2011-11-24: qty 1

## 2011-11-24 MED ORDER — ALBUTEROL SULFATE 1.25 MG/3ML IN NEBU: 1.0000 | INHALATION_SOLUTION | Freq: Four times a day (QID) | RESPIRATORY_TRACT | Status: DC | PRN

## 2011-11-24 MED ORDER — HYDROMORPHONE HCL PF 1 MG/ML IJ SOLN
0.5000 mg | INTRAMUSCULAR | Status: DC | PRN
  Administered 2011-11-24: 0.5 mg via INTRAVENOUS

## 2011-11-24 MED ORDER — HYDROCODONE-ACETAMINOPHEN 10-325 MG PO TABS: 1.0000 | ORAL_TABLET | Freq: Four times a day (QID) | ORAL | Status: DC | PRN

## 2011-11-24 MED ORDER — MORPHINE SULFATE 2 MG/ML IJ SOLN
INTRAMUSCULAR | Status: AC
  Filled 2011-11-24: qty 1

## 2011-11-24 MED ORDER — MORPHINE SULFATE 2 MG/ML IJ SOLN
2.0000 mg | INTRAMUSCULAR | Status: DC | PRN
  Administered 2011-11-24 (×2): 2 mg via INTRAVENOUS
  Filled 2011-11-24: qty 1

## 2011-11-24 MED ORDER — ACETAMINOPHEN 325 MG PO TABS
650.0000 mg | ORAL_TABLET | Freq: Four times a day (QID) | ORAL | Status: DC | PRN
  Administered 2011-11-24: 650 mg via ORAL
  Filled 2011-11-24: qty 2

## 2011-11-24 MED ORDER — SODIUM CHLORIDE 0.9 % IV SOLN: 250.0000 mL | INTRAVENOUS | Status: DC | PRN

## 2011-11-24 MED ORDER — FLUTICASONE PROPIONATE 50 MCG/ACT NA SUSP
2.0000 | Freq: Two times a day (BID) | NASAL | Status: DC
Start: 2011-11-24 — End: 2011-11-24
  Filled 2011-11-24: qty 16

## 2011-11-24 MED ORDER — METOCLOPRAMIDE HCL 10 MG PO TABS
10.0000 mg | ORAL_TABLET | Freq: Three times a day (TID) | ORAL | Status: DC
  Filled 2011-11-24 (×3): qty 1

## 2011-11-24 MED ORDER — ALBUTEROL SULFATE (5 MG/ML) 0.5% IN NEBU: 1.2500 mg | INHALATION_SOLUTION | Freq: Four times a day (QID) | RESPIRATORY_TRACT | Status: DC | PRN

## 2011-11-24 MED ORDER — ASPIRIN EC 325 MG PO TBEC: 325.0000 mg | DELAYED_RELEASE_TABLET | Freq: Every day | ORAL | Status: DC

## 2011-11-24 MED ORDER — CEFAZOLIN SODIUM 1-5 GM-% IV SOLN
INTRAVENOUS | Status: AC
  Filled 2011-11-24: qty 100

## 2011-11-24 MED ORDER — ALBUTEROL SULFATE HFA 108 (90 BASE) MCG/ACT IN AERS
2.0000 | INHALATION_SPRAY | RESPIRATORY_TRACT | Status: DC | PRN
Start: 2011-11-24 — End: 2011-11-24
  Administered 2011-11-24: 2 via RESPIRATORY_TRACT
  Filled 2011-11-24: qty 6.7

## 2011-11-24 MED ORDER — NITROGLYCERIN 0.4 MG SL SUBL
SUBLINGUAL_TABLET | SUBLINGUAL | Status: AC
  Administered 2011-11-24: 0.4 mg
  Filled 2011-11-24: qty 25

## 2011-11-24 MED ORDER — CYCLOBENZAPRINE HCL 10 MG PO TABS
10.0000 mg | ORAL_TABLET | Freq: Three times a day (TID) | ORAL | Status: DC | PRN
  Administered 2011-11-24: 10 mg via ORAL
  Filled 2011-11-24 (×2): qty 1

## 2011-11-24 MED ORDER — ONDANSETRON HCL 4 MG PO TABS: 4.0000 mg | ORAL_TABLET | Freq: Four times a day (QID) | ORAL | Status: DC | PRN

## 2011-11-24 MED ORDER — SODIUM CHLORIDE 0.9 % IJ SOLN: 3.0000 mL | INTRAMUSCULAR | Status: DC | PRN

## 2011-11-24 MED ORDER — SODIUM CHLORIDE 0.9 % IJ SOLN: 3.0000 mL | Freq: Two times a day (BID) | INTRAMUSCULAR | Status: DC

## 2011-11-24 MED ORDER — HYDROMORPHONE HCL 2 MG PO TABS: 2.0000 mg | ORAL_TABLET | ORAL | Status: DC | PRN

## 2011-11-24 MED ORDER — TIOTROPIUM BROMIDE MONOHYDRATE 18 MCG IN CAPS
18.0000 ug | ORAL_CAPSULE | Freq: Every day | RESPIRATORY_TRACT | Status: DC
  Administered 2011-11-24: 18 ug via RESPIRATORY_TRACT
  Filled 2011-11-24: qty 5

## 2011-11-24 MED ORDER — ENOXAPARIN SODIUM 40 MG/0.4ML ~~LOC~~ SOLN
40.0000 mg | SUBCUTANEOUS | Status: DC
  Filled 2011-11-24: qty 0.4

## 2011-11-24 MED ORDER — PANTOPRAZOLE SODIUM 40 MG PO TBEC: 40.0000 mg | DELAYED_RELEASE_TABLET | Freq: Every day | ORAL | Status: DC

## 2011-11-24 MED ORDER — GABAPENTIN 300 MG PO CAPS
300.0000 mg | ORAL_CAPSULE | Freq: Every day | ORAL | Status: DC
  Filled 2011-11-24: qty 1

## 2011-11-24 MED ORDER — ONDANSETRON HCL 4 MG/2ML IJ SOLN: 4.0000 mg | Freq: Four times a day (QID) | INTRAMUSCULAR | Status: DC | PRN

## 2011-11-24 MED ORDER — IPRATROPIUM BROMIDE 0.02 % IN SOLN: 500.0000 ug | Freq: Four times a day (QID) | RESPIRATORY_TRACT | Status: DC

## 2011-11-24 NOTE — ED Notes (Addendum)
Report called to 3700, Jeffrey Crane, Charity fundraiser.

## 2011-11-24 NOTE — Progress Notes (Signed)
   CARE MANAGEMENT NOTE 11/24/2011  Patient:  ERIS, BRECK   Account Number:  [id number]  Date Initiated:  11/24/2011  Documentation initiated by:  Letha Cape  Subjective/Objective Assessment:   dx chest pain  admit as observation.     Action/Plan:   Anticipated DC Date:  11/24/2011   Anticipated DC Plan:  HOME/SELF CARE      DC Planning Services  CM consult      Choice offered to / List presented to:             Status of service:  Completed, signed off Medicare Important Message given?   (If response is "NO", the following Medicare IM given date fields will be blank) Date Medicare IM given:   Date Additional Medicare IM given:    Discharge Disposition:  AGAINST MEDICAL ADVICE  Per UR Regulation:    If discussed at Long Length of Stay Meetings, dates discussed:    Comments:  11/24/11 16:09 Letha Cape RN, BSN [phone] patient lives with spouse, patient left AMA.

## 2011-11-24 NOTE — Progress Notes (Signed)
Utilization review completed.  

## 2011-11-24 NOTE — Progress Notes (Signed)
PT left hospital  AMA. Pt sign AMA form with an X for his signature

## 2011-11-24 NOTE — Progress Notes (Signed)
Called to patient room with patient complaining of back and knee pain.  Stated his chest "hurt a minute ago but better now." Pt stated "dilaudid did not help for too long, but that stuff I got for pain in emergency department worked." BP 102/60 Jeffrey Mcgregor NP notified.  New orders received for morphine.  Morphine IV given per PRN orders.  Pt states pain decreasing at this time.  Will continue to monitor patient.

## 2011-11-24 NOTE — Progress Notes (Signed)
Patient's current IV painful.  Patient states he is a hard stick and requesting IV services from IV team for IV restart.  IV team paged and patient added to list.  Will continue to monitor. Jeffrey Crane

## 2011-11-24 NOTE — Progress Notes (Signed)
Pt arrived to floor with complaints of 3/10 cp, back pain and knee pain.  1 sl ntg given and 0.5 mg dilaudid iv given with relief.  ekg obtained per standing orders and patient on 02@2L  [place].  BP 118/62.  Pt denies any shortness of breath, nausea, or vomiting. No diaphoresis noted.  Will continue to monitor patient.

## 2011-11-24 NOTE — Discharge Summary (Signed)
Admit date: 11/23/2011 Discharge date: 11/24/2011  Primary Care Physician:  Provider Not In System   Discharge Diagnoses:   No resolved problems to display.  Active Hospital Problems  Diagnoses Date Noted   . Chest pain at rest 11/23/2011   . Palpitation 11/23/2011   . Chronic pain syndrome 11/23/2011     Resolved Hospital Problems  Diagnoses Date Noted Date Resolved        SIGNIFICANT DIAGNOSTIC STUDIES:  Dg Chest 2 View  11/23/2011  *RADIOLOGY REPORT*  Clinical Data: Chest pain and shortness of breath; history of smoking.  CHEST - 2 VIEW  Comparison: Chest radiograph performed 10/02/2007  Findings: The lungs are well-aerated and clear.  There is no evidence of focal opacification, pleural effusion or pneumothorax.  The heart is normal in size; the mediastinal contour is within normal limits.  No acute osseous abnormalities are seen.  Cervical spinal fusion hardware is noted.  There appears to have been interval resection of the distal right clavicle.  IMPRESSION: No acute cardiopulmonary process seen.  Original Report Authenticated By: Tonia Ghent, M.D.   Mr Cervical Spine Wo Contrast  11/06/2011  *RADIOLOGY REPORT*  Clinical Data: Left neck pain with left shoulder and arm pain and numbness and weakness.  Cervical fusion  MRI CERVICAL SPINE WITHOUT CONTRAST  Technique:  Multiplanar and multiecho pulse sequences of the cervical spine, to include the craniocervical junction and cervicothoracic junction, were obtained according to standard protocol without intravenous contrast.  Comparison: Cervical radiographs 07/02/2010, cervical MRI 01/21/2010  Findings: ACDF at C4-5, C5-6, and C6-7.  Image quality degraded by motion.  Cervical alignment is normal.  Negative for fracture.  The cord is difficult to evaluate due to motion.  C2-3:  Mild disc degeneration and spurring on the left.  C3-4:  Mild disc degeneration and uncinate spurring without significant spinal stenosis.  C4-5:  ACDF without  stenosis.  C5-6:  ACDF without significant stenosis.  C6-7:  ACDF without significant spinal stenosis.  C7-C1:  Negative  IMPRESSION: ACDF C4-C7, unchanged from 01/21/2010.  No superimposed acute or new findings compared with the prior MRI.  Original Report Authenticated By: Camelia Phenes, M.D.    No results found for this or any previous visit (from the past 240 hour(s)).  BRIEF ADMITTING H & P: 51 y.o. male with history of chronic back and neck pain on chronic narcotic (Percocet 10 mg 5 times daily along with Dilaudid 2 mg to 3 times daily as needed) history of knee surgery, COPD, prior tobacco use, brought into the emergency room as his family noted the finger pulse oximetry show heart rate of 200 a few seconds, accompanied with transient chest pain. It happened only today. He denied any sustained substernal chest pain or pressure, lightheadedness, shortness of breath, nausea and vomiting or diaphoresis. Evaluation in emergency room showed normal sinus rhythm at 70, negative cardiac markers, unremarkable electrolytes, and a clear chest x-ray. Hospitalist was asked to admit patient for cardiac rule out and to monitor his rhythm.    No resolved problems to display.  Active Hospital Problems  Diagnoses Date Noted   . Chest pain at rest: His EKG showed normal sinus rhythm no T wave inversions cardiac enzymes are negative x2 when I went in to talk to the patient the patient was very mad and yelling, he said that he wanted to be discharged and will go to for size and see his pulmonologist. Explained to him the risk and benefits of doing so as he  only has on cardiac enzymes and he is 51 years old. Chest pain seems atypical for cardiac involvement, but as he only had one negative cardiac enzymes I tried to convey to him that we needed one or 2 more to make sure it wasn't cardiac. He still was abuse it. So he left AGAINST MEDICAL ADVICE to  11/23/2011   . Palpitation: Of the remain of normal sinus rhythm  on telemetry.  11/23/2011   . Chronic pain syndrome: No changes were made to his narcotics.  11/23/2011     Resolved Hospital Problems  Diagnoses Date Noted Date Resolved     Disposition and Follow-up:    Follow-up Information    Follow up with Provider Not In System .          DISCHARGE EXAM:  Patient very angry about the service his care. PSYCH: alert and oriented x4; does not appear anxious or depressed; affect is appropriate.  HEENT: Mucous membranes pink and anicteric; PERRLA; EOM intact; no cervical lymphadenopathy nor thyromegaly or carotid bruit; no JVD;  Breasts:: Not examined  CHEST WALL: No tenderness  CHEST: Normal respiration, clear to auscultation bilaterally  HEART: Regular rate and rhythm; no murmurs rubs or gallops  BACK: No kyphosis or scoliosis; no CVA tenderness  ABDOMEN: Obese, soft non-tender; no masses, no organomegaly, normal abdominal bowel sounds; no pannus; no intertriginous candida.  Rectal Exam: Not done  EXTREMITIES: No bone or joint deformity; age-appropriate arthropathy of the hands and knees; no edema; no ulcerations.  Genitalia: not examined  PULSES: 2+ and symmetric  SKIN: Normal hydration no rash or ulceration  CNS: Cranial nerves 2-12 grossly intact no focal lateralizing neurologic deficit   Blood pressure 102/65, pulse 61, temperature 97.6 F (36.4 C), temperature source Oral, resp. rate 18, height 5\' 6"  (1.676 m), weight 78.8 kg (173 lb 11.6 oz), SpO2 96.00%.   Basename 11/24/11 0323 11/23/11 2109  NA 138 142  K 3.6 3.9  CL 108 109  CO2 25 --  GLUCOSE 95 94  BUN 12 15  CREATININE 0.89 0.90  CALCIUM 8.4 --  MG -- --  PHOS -- --   No results found for this basename: AST:2,ALT:2,ALKPHOS:2,BILITOT:2,PROT:2,ALBUMIN:2 in the last 72 hours No results found for this basename: LIPASE:2,AMYLASE:2 in the last 72 hours  Basename 11/24/11 0323 11/23/11 2109  WBC 4.6 --  NEUTROABS -- --  HGB 10.2* 11.9*  HCT 33.1* 35.0*  MCV 74.5*  --  PLT 180 --    Signed: Marinda Elk M.D. 11/24/2011, 9:33 AM

## 2011-11-24 NOTE — ED Notes (Signed)
Family at [facility], updated, no changes.

## 2017-02-01 DIAGNOSIS — I513 Intracardiac thrombosis, not elsewhere classified: Secondary | ICD-10-CM

## 2017-02-01 HISTORY — DX: Intracardiac thrombosis, not elsewhere classified: I51.3

## 2017-02-11 ENCOUNTER — Inpatient Hospital Stay (HOSPITAL_COMMUNITY)
Admission: EM | Admit: 2017-02-11 | Discharge: 2017-02-18 | DRG: 268 | Disposition: A | Discharge status: Home / Self Care | Payer: Medicare Other | Attending: Internal Medicine | Admitting: Internal Medicine

## 2017-02-11 ENCOUNTER — Encounter (HOSPITAL_COMMUNITY): Payer: Self-pay | Admitting: Emergency Medicine

## 2017-02-11 DIAGNOSIS — K920 Hematemesis: Secondary | ICD-10-CM | POA: Diagnosis present

## 2017-02-11 DIAGNOSIS — E43 Unspecified severe protein-calorie malnutrition: Secondary | ICD-10-CM | POA: Diagnosis present

## 2017-02-11 DIAGNOSIS — Z8711 Personal history of peptic ulcer disease: Secondary | ICD-10-CM | POA: Diagnosis not present

## 2017-02-11 DIAGNOSIS — D649 Anemia, unspecified: Secondary | ICD-10-CM | POA: Diagnosis present

## 2017-02-11 DIAGNOSIS — Z87891 Personal history of nicotine dependence: Secondary | ICD-10-CM

## 2017-02-11 DIAGNOSIS — I772 Rupture of artery: Secondary | ICD-10-CM | POA: Diagnosis present

## 2017-02-11 DIAGNOSIS — R578 Other shock: Secondary | ICD-10-CM | POA: Diagnosis not present

## 2017-02-11 DIAGNOSIS — Z7901 Long term (current) use of anticoagulants: Secondary | ICD-10-CM | POA: Diagnosis not present

## 2017-02-11 DIAGNOSIS — E876 Hypokalemia: Secondary | ICD-10-CM | POA: Diagnosis present

## 2017-02-11 DIAGNOSIS — D62 Acute posthemorrhagic anemia: Secondary | ICD-10-CM | POA: Diagnosis present

## 2017-02-11 DIAGNOSIS — J42 Unspecified chronic bronchitis: Secondary | ICD-10-CM | POA: Diagnosis not present

## 2017-02-11 DIAGNOSIS — Z7951 Long term (current) use of inhaled steroids: Secondary | ICD-10-CM

## 2017-02-11 DIAGNOSIS — Z886 Allergy status to analgesic agent status: Secondary | ICD-10-CM | POA: Diagnosis not present

## 2017-02-11 DIAGNOSIS — Z6821 Body mass index (BMI) 21.0-21.9, adult: Secondary | ICD-10-CM

## 2017-02-11 DIAGNOSIS — D735 Infarction of spleen: Secondary | ICD-10-CM | POA: Diagnosis present

## 2017-02-11 DIAGNOSIS — R1013 Epigastric pain: Secondary | ICD-10-CM | POA: Diagnosis not present

## 2017-02-11 DIAGNOSIS — Z881 Allergy status to other antibiotic agents status: Secondary | ICD-10-CM

## 2017-02-11 DIAGNOSIS — I9589 Other hypotension: Secondary | ICD-10-CM | POA: Diagnosis present

## 2017-02-11 DIAGNOSIS — Z8673 Personal history of transient ischemic attack (TIA), and cerebral infarction without residual deficits: Secondary | ICD-10-CM | POA: Diagnosis not present

## 2017-02-11 DIAGNOSIS — G894 Chronic pain syndrome: Secondary | ICD-10-CM | POA: Diagnosis present

## 2017-02-11 DIAGNOSIS — D803 Selective deficiency of immunoglobulin G [IgG] subclasses: Secondary | ICD-10-CM | POA: Diagnosis present

## 2017-02-11 DIAGNOSIS — J449 Chronic obstructive pulmonary disease, unspecified: Secondary | ICD-10-CM | POA: Diagnosis present

## 2017-02-11 DIAGNOSIS — D869 Sarcoidosis, unspecified: Secondary | ICD-10-CM | POA: Diagnosis present

## 2017-02-11 DIAGNOSIS — D689 Coagulation defect, unspecified: Secondary | ICD-10-CM | POA: Diagnosis present

## 2017-02-11 DIAGNOSIS — I10 Essential (primary) hypertension: Secondary | ICD-10-CM | POA: Diagnosis present

## 2017-02-11 DIAGNOSIS — I513 Intracardiac thrombosis, not elsewhere classified: Secondary | ICD-10-CM | POA: Diagnosis present

## 2017-02-11 DIAGNOSIS — R9431 Abnormal electrocardiogram [ECG] [EKG]: Secondary | ICD-10-CM | POA: Diagnosis present

## 2017-02-11 DIAGNOSIS — K219 Gastro-esophageal reflux disease without esophagitis: Secondary | ICD-10-CM | POA: Diagnosis present

## 2017-02-11 DIAGNOSIS — Z888 Allergy status to other drugs, medicaments and biological substances status: Secondary | ICD-10-CM

## 2017-02-11 DIAGNOSIS — S35299A Unspecified injury of branches of celiac and mesenteric artery, initial encounter: Secondary | ICD-10-CM

## 2017-02-11 DIAGNOSIS — R58 Hemorrhage, not elsewhere classified: Secondary | ICD-10-CM

## 2017-02-11 DIAGNOSIS — K922 Gastrointestinal hemorrhage, unspecified: Secondary | ICD-10-CM | POA: Diagnosis not present

## 2017-02-11 DIAGNOSIS — Z903 Acquired absence of stomach [part of]: Secondary | ICD-10-CM

## 2017-02-11 DIAGNOSIS — M7981 Nontraumatic hematoma of soft tissue: Secondary | ICD-10-CM | POA: Diagnosis present

## 2017-02-11 DIAGNOSIS — R52 Pain, unspecified: Secondary | ICD-10-CM

## 2017-02-11 DIAGNOSIS — E8809 Other disorders of plasma-protein metabolism, not elsewhere classified: Secondary | ICD-10-CM | POA: Diagnosis present

## 2017-02-11 DIAGNOSIS — Z86711 Personal history of pulmonary embolism: Secondary | ICD-10-CM | POA: Diagnosis not present

## 2017-02-11 DIAGNOSIS — D7389 Other diseases of spleen: Secondary | ICD-10-CM | POA: Diagnosis not present

## 2017-02-11 HISTORY — DX: Intracardiac thrombosis, not elsewhere classified: I51.3

## 2017-02-11 HISTORY — DX: Anemia, unspecified: D64.9

## 2017-02-11 HISTORY — DX: Transient cerebral ischemic attack, unspecified: G45.9

## 2017-02-11 HISTORY — DX: Iron deficiency: E61.1

## 2017-02-11 HISTORY — DX: Sarcoidosis, unspecified: D86.9

## 2017-02-11 LAB — COMPREHENSIVE METABOLIC PANEL
ALBUMIN: 2.2 g/dL — AB (ref 3.5–5.0)
ALK PHOS: 67 U/L (ref 38–126)
ALT: 8 U/L — ABNORMAL LOW (ref 17–63)
ANION GAP: 9 (ref 5–15)
AST: 11 U/L — ABNORMAL LOW (ref 15–41)
BUN: 22 mg/dL — ABNORMAL HIGH (ref 6–20)
CALCIUM: 8.1 mg/dL — AB (ref 8.9–10.3)
CHLORIDE: 108 mmol/L (ref 101–111)
CO2: 19 mmol/L — AB (ref 22–32)
Creatinine, Ser: 1.01 mg/dL (ref 0.61–1.24)
GFR calc Af Amer: >60 mL/min (ref >60)
GFR calc non Af Amer: >60 mL/min (ref >60)
GLUCOSE: 139 mg/dL — AB (ref 65–99)
Potassium: 4 mmol/L (ref 3.5–5.1)
SODIUM: 136 mmol/L (ref 135–145)
Total Bilirubin: 0.3 mg/dL (ref 0.3–1.2)
Total Protein: 5.2 g/dL — ABNORMAL LOW (ref 6.5–8.1)

## 2017-02-11 LAB — POC OCCULT BLOOD, ED: Fecal Occult Bld: POSITIVE — AB

## 2017-02-11 LAB — CBC WITH DIFFERENTIAL/PLATELET
BASOS PCT: 0 %
Basophils Absolute: 0 10*3/uL (ref 0.0–0.1)
EOS ABS: 0 10*3/uL (ref 0.0–0.7)
EOS PCT: 0 %
HCT: 22.3 % — ABNORMAL LOW (ref 39.0–52.0)
Hemoglobin: 6.8 g/dL — CL (ref 13.0–17.0)
Lymphocytes Relative: 11 %
Lymphs Abs: 1.2 10*3/uL (ref 0.7–4.0)
MCH: 25.9 pg — ABNORMAL LOW (ref 26.0–34.0)
MCHC: 30.5 g/dL (ref 30.0–36.0)
MCV: 84.8 fL (ref 78.0–100.0)
Monocytes Absolute: 0.8 10*3/uL (ref 0.1–1.0)
Monocytes Relative: 7 %
Neutro Abs: 8.7 10*3/uL — ABNORMAL HIGH (ref 1.7–7.7)
Neutrophils Relative %: 81 %
PLATELETS: 539 10*3/uL — AB (ref 150–400)
RBC: 2.63 MIL/uL — AB (ref 4.22–5.81)
RDW: 18.2 % — ABNORMAL HIGH (ref 11.5–15.5)
WBC: 10.7 10*3/uL — AB (ref 4.0–10.5)

## 2017-02-11 LAB — I-STAT CHEM 8, ED
BUN: 23 mg/dL — AB (ref 6–20)
CALCIUM ION: 1.11 mmol/L — AB (ref 1.15–1.40)
CHLORIDE: 108 mmol/L (ref 101–111)
CREATININE: 0.9 mg/dL (ref 0.61–1.24)
GLUCOSE: 134 mg/dL — AB (ref 65–99)
HCT: 21 % — ABNORMAL LOW (ref 39.0–52.0)
Hemoglobin: 7.1 g/dL — ABNORMAL LOW (ref 13.0–17.0)
POTASSIUM: 4.1 mmol/L (ref 3.5–5.1)
Sodium: 139 mmol/L (ref 135–145)
TCO2: 20 mmol/L (ref 0–100)

## 2017-02-11 LAB — PROTIME-INR
INR: 1.23
PROTHROMBIN TIME: 15.6 s — AB (ref 11.4–15.2)

## 2017-02-11 LAB — I-STAT TROPONIN, ED: Troponin i, poc: 0 ng/mL (ref 0.00–0.08)

## 2017-02-11 LAB — APTT: aPTT: 40 seconds — ABNORMAL HIGH (ref 24–36)

## 2017-02-11 MED ORDER — PANTOPRAZOLE SODIUM 40 MG IV SOLR
80.0000 mg | Freq: Once | INTRAVENOUS | Status: AC
  Administered 2017-02-11: 80 mg via INTRAVENOUS
  Filled 2017-02-11: qty 80

## 2017-02-11 MED ORDER — FENTANYL CITRATE (PF) 100 MCG/2ML IJ SOLN
50.0000 ug | Freq: Once | INTRAMUSCULAR | Status: AC
  Administered 2017-02-11: 50 ug via INTRAVENOUS
  Filled 2017-02-11: qty 2

## 2017-02-11 MED ORDER — ONDANSETRON HCL 4 MG/2ML IJ SOLN
4.0000 mg | Freq: Once | INTRAMUSCULAR | Status: AC
  Administered 2017-02-11: 4 mg via INTRAVENOUS
  Filled 2017-02-11: qty 2

## 2017-02-11 MED ORDER — SODIUM CHLORIDE 0.9 % IV SOLN
10.0000 mL/h | Freq: Once | INTRAVENOUS | Status: AC
  Administered 2017-02-11: 10 mL/h via INTRAVENOUS

## 2017-02-11 MED ORDER — SODIUM CHLORIDE 0.9 % IV BOLUS (SEPSIS)
1000.0000 mL | Freq: Once | INTRAVENOUS | Status: AC
  Administered 2017-02-11: 1000 mL via INTRAVENOUS

## 2017-02-11 MED ORDER — METOCLOPRAMIDE HCL 5 MG/ML IJ SOLN
10.0000 mg | Freq: Once | INTRAMUSCULAR | Status: AC
  Administered 2017-02-11: 10 mg via INTRAVENOUS
  Filled 2017-02-11: qty 2

## 2017-02-11 MED ORDER — PANTOPRAZOLE SODIUM 40 MG IV SOLR
8.0000 mg/h | INTRAVENOUS | Status: DC
  Administered 2017-02-11 – 2017-02-16 (×11): 8 mg/h via INTRAVENOUS
  Filled 2017-02-11 (×19): qty 80

## 2017-02-11 NOTE — ED Notes (Signed)
[facility] Pt resting comfortably at this time

## 2017-02-11 NOTE — H&P (Signed)
History and Physical    Jeffrey HolterJames L Kinley EAV:[id number] DOB: [birth year] DOA: 02/11/2017  PCP: Patient, No Pcp Per   Patient coming from: Home.  I have personally briefly reviewed patient's old medical [facility]  Chief Complaint: "Vomiting blood"  HPI: Jeffrey HolterJames [name] is a 56 y.o. 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 He denies worsening post-op abdominal pain, diarrhea, constipation, melena or hematochezia. Fecal occult blood test was positive in the ED. He denies eating spicy foods, NSAIDs use, alcohol or cigarette smoking. His wife states that he has been drinking decaf coffee only once a day. He had a brief episode of chest pain following emesis, but is chest pain free now. He has felt a little lightheaded. He denies dyspnea, palpitations, diaphoresis, pitting edema of the lower extremities.   ED Course: Initial vital signs temperature 97.68F, pulse 91, blood pressure 97/64 mmHg, respirations 23 and O2 sat 96% on room air. His stool guaiac was positive and Dr. Verdie MosherLiu describes melanotic stools on rectal exam. PT/PTT was 15.6/40 seconds and INR 1.23. WBC 10.1, hemoglobin 6.8 g/dL and platelets [id number]. His CMP shows a sodium 136, potassium 4.0, chloride 108, bicarbonate 19 mmol/L. BUN 22, creatinine 1.01 and glucose 139 mg/dL. His albumin was low at 2.2 g/dL. Troponin level was normal. EKG showed nonspecific T-wave abnormalities in lateral leads.  The patient was given at 1000 mL normal saline bolus, fentanyl 50 g IVP 1, metoclopramide 10 mg IVP 1, Zofran 4  mg IVP 1, pantoprazole 80 mg loading dose, was started on a pantoprazole infusion and is currently getting transfused 2 units of PRBCs.   Review of Systems: As per HPI otherwise 10 point review of systems negative.    Past Medical History:  Diagnosis Date  . Acid reflux   . COPD (chronic obstructive pulmonary disease) (HCC)   . GI bleeding   . Hypoglycemia   . 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 TIA (transient ischemic attack)    x3    Past Surgical History:  Procedure Laterality Date  . ABDOMINAL SURGERY    . BACK SURGERY    . CARPAL TUNNEL RELEASE    . CERVICAL FUSION    . GASTRECTOMY    . HERNIA REPAIR     Abdominal wall  . KNEE SURGERY    . SHOULDER SURGERY    . SMALL INTESTINE SURGERY    . ULNAR NERVE REPAIR       reports that he quit smoking about 7 years ago. He does not have any smokeless tobacco history on file. He reports that he does not drink alcohol or use drugs.  Allergies  Allergen Reactions  . Famciclovir Rash  . Aspirin Other (See Comments)    Bleeding ulcer  . Avelox [Moxifloxacin Hcl In Nacl] Hives  . Chlorhexidine Gluconate Hives    Family History  Problem Relation Age of Onset  . COPD Mother   . Stroke Mother   . Colitis Mother        cdiff colitis  . Emphysema Father   . Diabetes  Mellitus II Father   . Congestive Heart Failure Father   . Rheum arthritis Sister   . Diabetes Mellitus II Sister   . COPD Sister   . Hypertension Sister   . Diabetes Mellitus II Brother   . Neuropathy Brother   . Colon cancer Brother   . Diabetes Mellitus II Sister   . Hypertension Sister   . Psoriasis Sister   . Osteoporosis Sister   . Neuropathy Sister   . Transient ischemic attack Sister     Prior to Admission medications   Medication Sig Start Date End Date Taking? Authorizing Provider  albuterol (ACCUNEB) 1.25 MG/3ML nebulizer solution Take 1 ampule by nebulization every 6 (six) hours as  needed. For shortness of breath   Yes [provider]  albuterol (PROVENTIL HFA;VENTOLIN HFA) 108 (90 BASE) MCG/ACT inhaler Inhale 2 puffs into the lungs every 6 (six) hours as needed. For shortness of breath   Yes [provider]  cyclobenzaprine (FLEXERIL) 10 MG tablet Take 10 mg by mouth 3 (three) times daily as needed. For muscle spasm   Yes [provider]  fluticasone (FLONASE) 50 MCG/ACT nasal spray Place 2 sprays into the nose 2 (two) times daily.   Yes [provider]  gabapentin (NEURONTIN) 300 MG capsule Take 300 mg by mouth daily.   Yes [provider]  HYDROcodone-acetaminophen (NORCO) 10-325 MG per tablet Take 1 tablet by mouth every 6 (six) hours as needed. For pain   Yes [provider]  LORazepam (ATIVAN) 0.5 MG tablet Take 0.5 mg by mouth every 8 (eight) hours. For anxiety   Yes [provider]  pantoprazole (PROTONIX) 20 MG tablet Take 20 mg by mouth daily.   Yes [provider]  promethazine (PHENERGAN) 25 MG tablet Take 25 mg by mouth every 6 (six) hours as needed. For nausea   Yes [provider]  Tiotropium Bromide Monohydrate (SPIRIVA RESPIMAT) 1.25 MCG/ACT AERS Inhale 2 puffs into the lungs daily.   Yes [provider]    Physical Exam: Vitals:   02/11/17 2200 02/11/17 2215 02/11/17 2230 02/11/17 2245  BP: (!) 98/53 95/64 94/61  (!) 98/56  Pulse: 74 77 74 73  Resp: (!) 22 (!) 21 (!) 21 (!) 21  Temp:      TempSrc:      SpO2: 100% 100% 100% 100%    Constitutional: Malnourished. Otherwise NAD, calm, comfortable Eyes: PERRL, lids and conjunctivae are pale. ENMT: Mucous membranes are mildly dry. Posterior pharynx clear of any exudate or lesions. Neck: normal, supple, no masses, no thyromegaly Respiratory: clear to auscultation bilaterally, no wheezing, no crackles. Normal respiratory effort. No accessory muscle use.  Cardiovascular: Regular rate and rhythm, no murmurs / rubs /  gallops. No extremity edema. 2+ pedal pulses. No carotid bruits.  Abdomen: Midline surgical dressing, soft, mild diffuse tenderness, no guarding/rebound/masses palpated. No hepatosplenomegaly. Bowel sounds positive.  Musculoskeletal: no clubbing / cyanosis. Good ROM, no contractures. Normal muscle tone.  Skin: no rashes, lesions, ulcers on limited skin exam Neurologic: CN 2-12 grossly intact. Sensation intact, DTR normal. Strength 5/5 in all 4.  Psychiatric: Normal judgment and insight. Alert and oriented x 4.    Labs on Admission: I have personally reviewed following labs and imaging studies  CBC:  Recent Labs Lab 02/11/17 1950 02/11/17 2003  WBC 10.7*  --   NEUTROABS 8.7*  --   HGB 6.8* 7.1*  HCT 22.3* 21.0*  MCV 84.8  --   PLT 539*  --  Basic Metabolic Panel:  Recent Labs Lab 02/11/17 1950 02/11/17 2003  NA 136 139  K 4.0 4.1  CL 108 108  CO2 19*  --   GLUCOSE 139* 134*  BUN 22* 23*  CREATININE 1.01 0.90  CALCIUM 8.1*  --    GFR: CrCl cannot be calculated (Unknown ideal weight.). Liver Function Tests:  Recent Labs Lab 02/11/17 1950  AST 11*  ALT 8*  ALKPHOS 67  BILITOT 0.3  PROT 5.2*  ALBUMIN 2.2*   No results for input(s): LIPASE, AMYLASE in the last 168 hours. No results for input(s): AMMONIA in the last 168 hours. Coagulation Profile:  Recent Labs Lab 02/11/17 1950  INR 1.23   Cardiac Enzymes: No results for input(s): CKTOTAL, CKMB, CKMBINDEX, TROPONINI in the last 168 hours. BNP (last 3 results) No results for input(s): PROBNP in the last 8760 hours. HbA1C: No results for input(s): HGBA1C in the last 72 hours. CBG: No results for input(s): GLUCAP in the last 168 hours. Lipid Profile: No results for input(s): CHOL, HDL, LDLCALC, TRIG, CHOLHDL, LDLDIRECT in the last 72 hours. Thyroid Function Tests: No results for input(s): TSH, T4TOTAL, FREET4, T3FREE, THYROIDAB in the last 72 hours. Anemia Panel: No results for input(s): VITAMINB12,  FOLATE, FERRITIN, TIBC, IRON, RETICCTPCT in the last 72 hours. Urine analysis:    Component Value Date/Time   COLORURINE YELLOW 10/02/2007 0532   APPEARANCEUR CLEAR 10/02/2007 0532   LABSPEC 1.018 10/02/2007 0532   PHURINE 6.0 10/02/2007 0532   GLUCOSEU NEGATIVE 10/02/2007 0532   HGBUR NEGATIVE 10/02/2007 0532   BILIRUBINUR NEGATIVE 10/02/2007 0532   KETONESUR NEGATIVE 10/02/2007 0532   PROTEINUR NEGATIVE 10/02/2007 0532   UROBILINOGEN 1.0 10/02/2007 0532   NITRITE NEGATIVE 10/02/2007 0532   LEUKOCYTESUR  10/02/2007 0532    NEGATIVE MICROSCOPIC NOT DONE ON URINES WITH NEGATIVE PROTEIN, BLOOD, LEUKOCYTES, NITRITE, OR GLUCOSE <1000 mg/dL.    Radiological Exams on Admission: No results found.  EKG: Independently reviewed. Vent. rate 85 BPM PR interval * ms QRS duration 84 ms QT/QTc 361/430 ms P-R-T axes 92 86 91 Sinus rhythm Nonspecific T abnormalities, lateral leads  Assessment/Plan Principal Problem:   UGI bleed Admit to stepdown/inpatient. Keep nothing by mouth. Continue supplemental oxygen. Continue PRBC transfusion. Monitor hematocrit and hemoglobin. GI to evaluate in the morning.  Active Problems:   Chronic pain syndrome Currently nothing by mouth. Low-dose fentanyl as needed for pain if blood pressure allows.    COPD (chronic obstructive pulmonary disease) (HCC) Supplemental oxygen. Duo neb every 6 hours as needed.    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 Hold warfarin due to GI bleed. Daily PT/INR.    Anemia Continue PRBCs transfusion. Monitor hematocrit and hemoglobin.    Hypoalbuminemia Consult nutrition services during this hospitalization.    Abnormal EKG. Denies chest pain and has negative troponin level. Recheck EKG in a.m. posttransfusion.   DVT prophylaxis: SCDs. Code Status: Full code. Family Communication: His spouse  Jorge Retz [phone]. Sister Lupita Leash [phone] Disposition Plan: Admit for packed RBC transfusion, H&H monitoring and GI evaluation. Consults called: Gastroenterology was consulted by the ED and stated they will see the patient in a.m. Admission status: Inpatient/stepdown.   Bobette Mo MD [facility] Pager [phone].  If 7PM-7AM, please contact night-coverage [email or web address] Password [email or web address]  02/11/2017, 11:09 PM

## 2017-02-11 NOTE — ED Triage Notes (Signed)
Pt arrives by [facility] for bloody emesis x1 bright red large amount per pt. Pt had major abd [place] in [place]. Pt is pale and was hypotensive PTA with ems pressure reported in 70's.

## 2017-02-11 NOTE — ED Notes (Signed)
Pt had episode of coughing up bright red blood clot.

## 2017-02-11 NOTE — ED Notes (Signed)
ED Provider at bedside. 

## 2017-02-11 NOTE — ED Provider Notes (Signed)
[facility]-EMERGENCY DEPT Provider Note   CSN: [id number] Arrival date & time: 02/11/17  1938     History   Chief Complaint Chief Complaint  Patient presents with  . GI Bleeding  . Hypotension    HPI Jeffrey Crane is a 56 y.o. male.  HPI 56 year old male who presents with hematemesis. History prior GI bleeding, possible due to ulcers. History of IgG immune deficiency disorder. He takes warfarin for history of PE.  No liver disease history, but s/p gastrectomy recently 01/2017 in [place], [place]. States some onset of hematemesis at about 6:30 PM today with passage of blood clots. Denies any melena or hematochezia. Endorse diffuse abdominal tenderness. No syncope or near syncope, but feels weak and fatigued. Also had brief episode of chest pain after vomiting, which is now resolved. He denies NSAID usage or aspirin. On EMS arrival, he did have hypotension with systolic blood pressures in the 70s. On arrival to the ED blood pressure is 90 systolic.  Past Medical History:  Diagnosis Date  . Acid reflux   . COPD (chronic obstructive pulmonary disease) (HCC)   . GI bleeding   . Hypoglycemia     Patient Active Problem List   Diagnosis Date Noted  . UGI bleed 02/11/2017  . Chest pain at rest 11/23/2011  . Palpitation 11/23/2011  . Chronic pain syndrome 11/23/2011    Past Surgical History:  Procedure Laterality Date  . ABDOMINAL SURGERY    . BACK SURGERY    . CARPAL TUNNEL RELEASE    . CERVICAL FUSION    . GASTRECTOMY    . HERNIA REPAIR    . KNEE SURGERY    . SHOULDER SURGERY    . ULNAR NERVE REPAIR         Home Medications    Prior to Admission medications   Medication Sig Start Date End Date Taking? Authorizing Provider  albuterol (ACCUNEB) 1.25 MG/3ML nebulizer solution Take 1 ampule by nebulization every 6 (six) hours as needed. For shortness of breath   Yes [provider]  albuterol (PROVENTIL HFA;VENTOLIN HFA) 108 (90 BASE) MCG/ACT inhaler Inhale 2 puffs into  the lungs every 6 (six) hours as needed. For shortness of breath   Yes [provider]  cyclobenzaprine (FLEXERIL) 10 MG tablet Take 10 mg by mouth 3 (three) times daily as needed. For muscle spasm   Yes [provider]  fluticasone (FLONASE) 50 MCG/ACT nasal spray Place 2 sprays into the nose 2 (two) times daily.   Yes [provider]  gabapentin (NEURONTIN) 300 MG capsule Take 300 mg by mouth daily.   Yes [provider]  HYDROcodone-acetaminophen (NORCO) 10-325 MG per tablet Take 1 tablet by mouth every 6 (six) hours as needed. For pain   Yes [provider]  LORazepam (ATIVAN) 0.5 MG tablet Take 0.5 mg by mouth every 8 (eight) hours. For anxiety   Yes [provider]  pantoprazole (PROTONIX) 20 MG tablet Take 20 mg by mouth daily.   Yes [provider]  promethazine (PHENERGAN) 25 MG tablet Take 25 mg by mouth every 6 (six) hours as needed. For nausea   Yes [provider]  Tiotropium Bromide Monohydrate (SPIRIVA RESPIMAT) 1.25 MCG/ACT AERS Inhale 2 puffs into the lungs daily.   Yes [provider]    Family History History reviewed. No pertinent family history.  Social History Social History  Substance Use Topics  . Smoking status: Former Smoker    Quit date: 11/22/2009  .  Smokeless tobacco: Not on file  . Alcohol use No     Allergies   Famciclovir; Aspirin; Avelox [moxifloxacin hcl in nacl]; and Chlorhexidine gluconate   Review of Systems Review of Systems  Constitutional: Positive for fatigue.  Gastrointestinal: Positive for abdominal pain, nausea and vomiting.  Allergic/Immunologic: Positive for immunocompromised state.  Hematological: Bruises/bleeds easily.  All other systems reviewed and are negative.    Physical Exam Updated Vital Signs BP (!) 96/57   Pulse 73   Temp 98.2 F (36.8 C) (Oral)   Resp 17   SpO2 100%   Physical Exam Physical Exam  Nursing note and vitals  reviewed. Constitutional: Frail, pale, malnourished appearing, acutely ill appearing, in no acute distress Head: Normocephalic and atraumatic.  Mouth/Throat: Oropharynx is clear and moist.  Neck: Normal range of motion. Neck supple.  Cardiovascular: Near tachycardic rate and regular rhythm.   Pulmonary/Chest: Effort normal and breath sounds normal.  Abdominal: Soft. Midline vertical surgical incision, well approximated, clean, dry, and intact. There is diffuse tenderness. There is no rebound and no guarding. Melena on rectal exam. Musculoskeletal: Normal range of motion.  Neurological: Alert, no facial droop, fluent speech, moves all extremities symmetrically Skin: Skin is warm and dry.  Psychiatric: Cooperative   ED Treatments / Results  Labs (all labs ordered are listed, but only abnormal results are displayed) Labs Reviewed  CBC WITH DIFFERENTIAL/PLATELET - Abnormal; Notable for the following:       Result Value   WBC 10.7 (*)    RBC 2.63 (*)    Hemoglobin 6.8 (*)    HCT 22.3 (*)    MCH 25.9 (*)    RDW 18.2 (*)    Platelets 539 (*)    Neutro Abs 8.7 (*)    All other components within normal limits  COMPREHENSIVE METABOLIC PANEL - Abnormal; Notable for the following:    CO2 19 (*)    Glucose, Bld 139 (*)    BUN 22 (*)    Calcium 8.1 (*)    Total Protein 5.2 (*)    Albumin 2.2 (*)    AST 11 (*)    ALT 8 (*)    All other components within normal limits  PROTIME-INR - Abnormal; Notable for the following:    Prothrombin Time 15.6 (*)    All other components within normal limits  APTT - Abnormal; Notable for the following:    aPTT 40 (*)    All other components within normal limits  POC OCCULT BLOOD, ED - Abnormal; Notable for the following:    Fecal Occult Bld POSITIVE (*)    All other components within normal limits  I-STAT CHEM 8, ED - Abnormal; Notable for the following:    BUN 23 (*)    Glucose, Bld 134 (*)    Calcium, Ion 1.11 (*)    Hemoglobin 7.1 (*)    HCT  21.0 (*)    All other components within normal limits  I-STAT TROPOININ, ED  TYPE AND SCREEN  ABO/RH  PREPARE RBC (CROSSMATCH)    EKG  EKG Interpretation  Date/Time:  Wednesday February 11 2017 20:00:38 EDT Ventricular Rate:  85 PR Interval:    QRS Duration: 84 QT Interval:  361 QTC Calculation: 430 R Axis:   86 Text Interpretation:  Sinus rhythm Nonspecific T abnormalities, lateral leads Confirmed by Crista CurbLiu, Emiko Osorto [phone]([id number]) on 02/11/2017 9:01:32 PM       Radiology No results found.  Procedures Procedures (including critical care time) CRITICAL CARE Performed by:  Neysa Bonito Andrya Roppolo   Total critical care time: 45 minutes  Critical care time was exclusive of separately billable procedures and treating other patients.  Critical care was necessary to treat or prevent imminent or life-threatening deterioration.  Critical care was time spent personally by me on the following activities: development of treatment plan with patient and/or surrogate as well as nursing, discussions with consultants, evaluation of patient's response to treatment, examination of patient, obtaining history from patient or surrogate, ordering and performing treatments and interventions, ordering and review of laboratory studies, ordering and review of radiographic studies, pulse oximetry and re-evaluation of patient's condition.  Medications Ordered in ED Medications  pantoprazole (PROTONIX) 80 mg in sodium chloride 0.9 % 250 mL (0.32 mg/mL) infusion (8 mg/hr Intravenous New Bag/Given 02/11/17 2049)  sodium chloride 0.9 % bolus 1,000 mL (1,000 mLs Intravenous New Bag/Given 02/11/17 1957)  ondansetron (ZOFRAN) injection 4 mg (4 mg Intravenous Given 02/11/17 1959)  pantoprazole (PROTONIX) 80 mg in sodium chloride 0.9 % 100 mL IVPB (0 mg Intravenous Stopped 02/11/17 2049)  fentaNYL (SUBLIMAZE) injection 50 mcg (50 mcg Intravenous Given 02/11/17 2013)  0.9 %  sodium chloride infusion (10 mL/hr Intravenous New Bag/Given 02/11/17 2131)   metoCLOPramide (REGLAN) injection 10 mg (10 mg Intravenous Given 02/11/17 2130)     Initial Impression / Assessment and Plan / ED Course  I have reviewed the triage vital signs and the nursing notes.  Pertinent labs & imaging results that were available during my care of the patient were reviewed by me and considered in my medical decision making (see chart for details).     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   Outside records are requested, and we are able to obtain recent operative note from his subtotal gastrectomy with Roux-en-Y on 01/26/2017.  Does have mild hypotension with systolic blood pressures in the 90s over 60s during ED course. Have concerns for ongoing GI bleeding, with melena on exam. Does have acute blood loss anemia with a hemoglobin of 6.8, and will be receiving 2 units of packed red blood cells. He has a subtherapeutic INR on Coumadin of 1.23. BUN creatinine ratio is mildly elevated at 22-1. No NSAID usage or aspirin use. Is started on a Protonix drip. Discussed with Dr. Ewing Schlein from gastroenterology, who will evaluate patient, likely in the morning pending outside records and likely surgical clearance. Plan to admit to medicine service for ongoing management. Discussed with Dr. Robb Matar. Patient to be admitted to stepdown.  Final Clinical Impressions(s) / ED Diagnoses   Final diagnoses:  Acute blood loss anemia  Other specified hypotension  Upper GI bleed    New Prescriptions New Prescriptions   No medications on file     Lavera Guise, MD 02/11/17 2132

## 2017-02-11 NOTE — ED Notes (Signed)
Admitting at bedside 

## 2017-02-11 NOTE — ED Notes (Signed)
Blood bank states blood should be ready within 10 minutes will go get blood when ready.

## 2017-02-11 NOTE — ED Notes (Signed)
Pts wife cell number [phone]

## 2017-02-12 ENCOUNTER — Inpatient Hospital Stay (HOSPITAL_COMMUNITY): Payer: Medicare Other

## 2017-02-12 ENCOUNTER — Encounter (HOSPITAL_COMMUNITY): Payer: Self-pay | Admitting: General Practice

## 2017-02-12 DIAGNOSIS — K922 Gastrointestinal hemorrhage, unspecified: Secondary | ICD-10-CM

## 2017-02-12 HISTORY — PX: IR ANGIOGRAM VISCERAL SELECTIVE: IMG657

## 2017-02-12 HISTORY — PX: IR EMBO ART  VEN HEMORR LYMPH EXTRAV  INC GUIDE ROADMAPPING: IMG5450

## 2017-02-12 HISTORY — PX: IR ANGIOGRAM SELECTIVE EACH ADDITIONAL VESSEL: IMG667

## 2017-02-12 HISTORY — PX: IR US GUIDE VASC ACCESS RIGHT: IMG2390

## 2017-02-12 LAB — HEMOGLOBIN AND HEMATOCRIT, BLOOD
HCT: 24 % — ABNORMAL LOW (ref 39.0–52.0)
HEMOGLOBIN: 7.7 g/dL — AB (ref 13.0–17.0)

## 2017-02-12 LAB — PROCALCITONIN: Procalcitonin: 11.24 ng/mL

## 2017-02-12 LAB — HIV ANTIBODY (ROUTINE TESTING W REFLEX): HIV Screen 4th Generation wRfx: NONREACTIVE

## 2017-02-12 LAB — CBC
HCT: 26.9 % — ABNORMAL LOW (ref 39.0–52.0)
HCT: 18 % — ABNORMAL LOW (ref 39.0–52.0)
HEMOGLOBIN: 8.6 g/dL — AB (ref 13.0–17.0)
Hemoglobin: 5.7 g/dL — CL (ref 13.0–17.0)
MCH: 27.8 pg (ref 26.0–34.0)
MCH: 27.1 pg (ref 26.0–34.0)
MCHC: 32 g/dL (ref 30.0–36.0)
MCHC: 31.7 g/dL (ref 30.0–36.0)
MCV: 87.1 fL (ref 78.0–100.0)
MCV: 85.7 fL (ref 78.0–100.0)
PLATELETS: 563 10*3/uL — AB (ref 150–400)
Platelets: 662 10*3/uL — ABNORMAL HIGH (ref 150–400)
RBC: 3.09 MIL/uL — AB (ref 4.22–5.81)
RBC: 2.1 MIL/uL — AB (ref 4.22–5.81)
RDW: 17.5 % — ABNORMAL HIGH (ref 11.5–15.5)
RDW: 17.2 % — ABNORMAL HIGH (ref 11.5–15.5)
WBC: 15.6 10*3/uL — ABNORMAL HIGH (ref 4.0–10.5)
WBC: 26.6 10*3/uL — AB (ref 4.0–10.5)

## 2017-02-12 LAB — BASIC METABOLIC PANEL
Anion gap: 8 (ref 5–15)
BUN: 24 mg/dL — AB (ref 6–20)
CHLORIDE: 111 mmol/L (ref 101–111)
CO2: 18 mmol/L — ABNORMAL LOW (ref 22–32)
Calcium: 7.9 mg/dL — ABNORMAL LOW (ref 8.9–10.3)
Creatinine, Ser: 0.8 mg/dL (ref 0.61–1.24)
GFR calc Af Amer: >60 mL/min (ref >60)
GFR calc non Af Amer: >60 mL/min (ref >60)
GLUCOSE: 128 mg/dL — AB (ref 65–99)
POTASSIUM: 4.1 mmol/L (ref 3.5–5.1)
Sodium: 137 mmol/L (ref 135–145)

## 2017-02-12 LAB — ABO/RH: ABO/RH(D): O POS

## 2017-02-12 LAB — LACTIC ACID, PLASMA: LACTIC ACID, VENOUS: 1.5 mmol/L (ref 0.5–1.9)

## 2017-02-12 LAB — GLUCOSE, CAPILLARY
GLUCOSE-CAPILLARY: 79 mg/dL (ref 65–99)
Glucose-Capillary: 202 mg/dL — ABNORMAL HIGH (ref 65–99)

## 2017-02-12 LAB — PROTIME-INR
INR: 1.26
Prothrombin Time: 15.9 seconds — ABNORMAL HIGH (ref 11.4–15.2)

## 2017-02-12 LAB — PREPARE RBC (CROSSMATCH)

## 2017-02-12 LAB — MRSA PCR SCREENING: MRSA by PCR: POSITIVE — AB

## 2017-02-12 MED ORDER — LIDOCAINE HCL (PF) 1 % IJ SOLN
INTRAMUSCULAR | Status: AC
  Filled 2017-02-12: qty 30

## 2017-02-12 MED ORDER — IOPAMIDOL (ISOVUE-300) INJECTION 61%
INTRAVENOUS | Status: AC
  Filled 2017-02-12: qty 300

## 2017-02-12 MED ORDER — PIPERACILLIN-TAZOBACTAM 3.375 G IVPB
3.3750 g | Freq: Three times a day (TID) | INTRAVENOUS | Status: DC
  Administered 2017-02-12 – 2017-02-18 (×18): 3.375 g via INTRAVENOUS
  Filled 2017-02-12 (×21): qty 50

## 2017-02-12 MED ORDER — IOPAMIDOL (ISOVUE-300) INJECTION 61%
INTRAVENOUS | Status: AC
  Administered 2017-02-12: 30 mL via INTRAVENOUS
  Filled 2017-02-12: qty 100

## 2017-02-12 MED ORDER — FENTANYL CITRATE (PF) 100 MCG/2ML IJ SOLN
25.0000 ug | INTRAMUSCULAR | Status: AC | PRN
  Administered 2017-02-12 (×3): 25 ug via INTRAVENOUS
  Filled 2017-02-12 (×3): qty 2

## 2017-02-12 MED ORDER — SODIUM CHLORIDE 0.9 % IV SOLN
INTRAVENOUS | Status: DC
  Administered 2017-02-12 (×3): via INTRAVENOUS

## 2017-02-12 MED ORDER — SODIUM CHLORIDE 0.9 % IV BOLUS (SEPSIS)
1000.0000 mL | Freq: Once | INTRAVENOUS | Status: AC
  Administered 2017-02-12: 1000 mL via INTRAVENOUS

## 2017-02-12 MED ORDER — ACETAMINOPHEN 80 MG RE SUPP
80.0000 mg | Freq: Four times a day (QID) | RECTAL | Status: DC | PRN
  Administered 2017-02-12 (×2): 80 mg via RECTAL
  Filled 2017-02-12 (×4): qty 1

## 2017-02-12 MED ORDER — SODIUM CHLORIDE 0.9 % IV SOLN
100.0000 mg | INTRAVENOUS | Status: DC
  Administered 2017-02-13 – 2017-02-17 (×5): 100 mg via INTRAVENOUS
  Filled 2017-02-12 (×7): qty 100

## 2017-02-12 MED ORDER — IOPAMIDOL (ISOVUE-300) INJECTION 61%
INTRAVENOUS | Status: AC
  Administered 2017-02-12: 20 mL via INTRAVENOUS
  Filled 2017-02-12: qty 150

## 2017-02-12 MED ORDER — IPRATROPIUM-ALBUTEROL 0.5-2.5 (3) MG/3ML IN SOLN
3.0000 mL | Freq: Four times a day (QID) | RESPIRATORY_TRACT | Status: DC
  Administered 2017-02-13: 3 mL via RESPIRATORY_TRACT
  Filled 2017-02-12: qty 3

## 2017-02-12 MED ORDER — IOPAMIDOL (ISOVUE-300) INJECTION 61%
150.0000 mL | Freq: Once | INTRAVENOUS | Status: AC | PRN
  Administered 2017-02-12: 30 mL via INTRA_ARTERIAL

## 2017-02-12 MED ORDER — METOCLOPRAMIDE HCL 5 MG/ML IJ SOLN
5.0000 mg | Freq: Four times a day (QID) | INTRAMUSCULAR | Status: DC
  Administered 2017-02-12 – 2017-02-18 (×24): 5 mg via INTRAVENOUS
  Filled 2017-02-12 (×24): qty 2

## 2017-02-12 MED ORDER — SODIUM CHLORIDE 0.9 % IV SOLN: Freq: Once | INTRAVENOUS | Status: DC

## 2017-02-12 MED ORDER — IOPAMIDOL (ISOVUE-300) INJECTION 61%
150.0000 mL | Freq: Once | INTRAVENOUS | Status: AC | PRN
  Administered 2017-02-12: 20 mL via INTRA_ARTERIAL

## 2017-02-12 MED ORDER — MUPIROCIN 2 % EX OINT
1.0000 "application " | TOPICAL_OINTMENT | Freq: Two times a day (BID) | CUTANEOUS | Status: AC
  Administered 2017-02-12 – 2017-02-16 (×10): 1 via NASAL
  Filled 2017-02-12 (×5): qty 22

## 2017-02-12 MED ORDER — SODIUM CHLORIDE 0.9 % IV SOLN
INTRAVENOUS | Status: DC
  Administered 2017-02-13 – 2017-02-14 (×2): via INTRAVENOUS
  Administered 2017-02-15: 50 mL via INTRAVENOUS
  Administered 2017-02-15 – 2017-02-18 (×5): via INTRAVENOUS

## 2017-02-12 MED ORDER — MIDAZOLAM HCL 2 MG/2ML IJ SOLN
INTRAMUSCULAR | Status: AC
  Filled 2017-02-12: qty 8

## 2017-02-12 MED ORDER — PIPERACILLIN-TAZOBACTAM 3.375 G IVPB 30 MIN
3.3750 g | Freq: Once | INTRAVENOUS | Status: AC
  Administered 2017-02-12: 3.375 g via INTRAVENOUS
  Filled 2017-02-12: qty 50

## 2017-02-12 MED ORDER — PROMETHAZINE HCL 25 MG/ML IJ SOLN
12.5000 mg | Freq: Four times a day (QID) | INTRAMUSCULAR | Status: DC | PRN
  Administered 2017-02-12 – 2017-02-14 (×6): 12.5 mg via INTRAVENOUS
  Filled 2017-02-12 (×6): qty 1

## 2017-02-12 MED ORDER — IOPAMIDOL (ISOVUE-300) INJECTION 61%
INTRAVENOUS | Status: AC
  Administered 2017-02-12: 30 mL via INTRAVENOUS
  Filled 2017-02-12: qty 30

## 2017-02-12 MED ORDER — FENTANYL CITRATE (PF) 100 MCG/2ML IJ SOLN
INTRAMUSCULAR | Status: AC | PRN
  Administered 2017-02-12 (×2): 25 ug via INTRAVENOUS
  Administered 2017-02-12: 50 ug via INTRAVENOUS

## 2017-02-12 MED ORDER — IPRATROPIUM-ALBUTEROL 0.5-2.5 (3) MG/3ML IN SOLN
3.0000 mL | Freq: Four times a day (QID) | RESPIRATORY_TRACT | Status: DC
  Administered 2017-02-12 (×2): 3 mL via RESPIRATORY_TRACT
  Filled 2017-02-12 (×2): qty 3

## 2017-02-12 MED ORDER — ONDANSETRON HCL 4 MG/2ML IJ SOLN
4.0000 mg | Freq: Four times a day (QID) | INTRAMUSCULAR | Status: DC | PRN
  Administered 2017-02-12 – 2017-02-18 (×21): 4 mg via INTRAVENOUS
  Filled 2017-02-12 (×22): qty 2

## 2017-02-12 MED ORDER — ALBUTEROL SULFATE (2.5 MG/3ML) 0.083% IN NEBU
2.5000 mg | INHALATION_SOLUTION | RESPIRATORY_TRACT | Status: DC | PRN
Start: 2017-02-12 — End: 2017-02-14

## 2017-02-12 MED ORDER — IOPAMIDOL (ISOVUE-300) INJECTION 61%
100.0000 mL | Freq: Once | INTRAVENOUS | Status: AC | PRN
  Administered 2017-02-12: 20 mL via INTRAVENOUS
  Administered 2017-02-12 (×2): 30 mL via INTRAVENOUS

## 2017-02-12 MED ORDER — LIDOCAINE HCL 1 % IJ SOLN
INTRAMUSCULAR | Status: AC | PRN
  Administered 2017-02-12: 10 mL

## 2017-02-12 MED ORDER — IOPAMIDOL (ISOVUE-370) INJECTION 76%: 150.0000 mL | Freq: Once | INTRAVENOUS | Status: DC | PRN

## 2017-02-12 MED ORDER — SODIUM CHLORIDE 0.9 % IV SOLN
1500.0000 mg | INTRAVENOUS | Status: DC
  Administered 2017-02-12 – 2017-02-16 (×5): 1500 mg via INTRAVENOUS
  Filled 2017-02-12 (×6): qty 1500

## 2017-02-12 MED ORDER — MIDAZOLAM HCL 2 MG/2ML IJ SOLN
INTRAMUSCULAR | Status: AC | PRN
  Administered 2017-02-12 (×5): 1 mg via INTRAVENOUS

## 2017-02-12 MED ORDER — FENTANYL CITRATE (PF) 100 MCG/2ML IJ SOLN
25.0000 ug | INTRAMUSCULAR | Status: DC | PRN
Start: 2017-02-12 — End: 2017-02-13
  Administered 2017-02-12 (×4): 25 ug via INTRAVENOUS
  Filled 2017-02-12 (×5): qty 2

## 2017-02-12 MED ORDER — FENTANYL CITRATE (PF) 100 MCG/2ML IJ SOLN
INTRAMUSCULAR | Status: AC
  Filled 2017-02-12: qty 6

## 2017-02-12 MED ORDER — SODIUM CHLORIDE 0.9 % IV SOLN
200.0000 mg | Freq: Once | INTRAVENOUS | Status: AC
  Administered 2017-02-12: 200 mg via INTRAVENOUS
  Filled 2017-02-12: qty 200

## 2017-02-12 NOTE — Progress Notes (Signed)
CT reviewed  Splenic rupture noted with extravasation noted Operative exploration extremely hazardous at this point. Agree with embolization as the best option Need at some point to evaluate anastomosis since anatomy not completely clear on CT due to hematoma. Keep NPO FOR now  Continue supportive care

## 2017-02-12 NOTE — Progress Notes (Signed)
CRITICAL VALUE ALERT  Critical Value:  MRSA nasal swab positive  Date & Time Notied:  02/12/2017 0500  Provider Notified: orders placed per protocol except chlorhexidine given patient allergy  Orders Received/Actions taken: orders placed per protocol except chlorhexidine given patient allergy

## 2017-02-12 NOTE — Progress Notes (Signed)
eLink Physician-Brief Progress Note Patient Name: Jeffrey HolterJames L Marin DOB: October 01, 1960 MRN: [id number]   Date of Service  02/12/2017  HPI/Events of Note  Called by IR, Dr. Grace IsaacWatts, that he feels that bleeding is related to complete staple line breakdown from original surgery. He did coil embolize the L inferior phrenic artery and L gastric artery bleeding vessels. Dr. Grace IsaacWatts states that the surgeon can call him if he has any further questions.   eICU Interventions  I spoke with Dr. Janee Mornhompson who is the surgeon on call for Dr. Luisa Hartornett and related these findings to him.      Intervention Category Minor Interventions: Communication with other healthcare providers and/or family  Lenell AntuSommer,Steven Eugene 02/12/2017, 6:02 PM

## 2017-02-12 NOTE — Sedation Documentation (Signed)
Pt restless  

## 2017-02-12 NOTE — Sedation Documentation (Signed)
Per pt request, pt's light suction from pt's mouth was performed.

## 2017-02-12 NOTE — Progress Notes (Signed)
Called by radiology about active bleeding in the spleen.  Needs embolization.  To be taken to IR for embolization.  Patient's primary notified.  [facility] is aware of patient per radiology.  Alyson ReedyWesam G. Yacoub, M.D. [facility]. Pager: [phone]. After hours pager: [phone].

## 2017-02-12 NOTE — Progress Notes (Signed)
CRITICAL VALUE ALERT  Critical Value:  Hemoglobin (5.7)  Date & Time Notied:  02/12/2017 1240  Provider Notified: DR Harle StanfordSabia @ 1255 via amion Orders Received/Actions taken: transfuse 3 units of PRBC's

## 2017-02-12 NOTE — Progress Notes (Signed)
PROGRESS NOTE    Jeffrey Crane  ZOX:[id number] DOB: [birth year] DOA: 02/11/2017 PCP: Patient, No Pcp Per  Outpatient Specialists:     Brief Narrative:   Jeffrey Crane was admitted to [place] for upper GI bleed and abdominal pain after a history of gastrectomy one month ago.  Assessment & Plan:      1-UGI bleed mostly 2/2 PUD with possible perforation with sepsis and signs of peritoneal irritation .   2-Chronic pain syndrome after multiple 7 abdominal surgeries with a recent subtotal gastrectomy .   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   Continue with PPI drip, start Zosyn and aggressive IV fluids, transfuse blood, sent for CT scan of the abdomen and pelvis to rule out perforation , surgery has been consulted and critical care team notified, I discussed the case with the family and surgery and critical care patient is very sick and may need to be transferred to the ICU soon .       DVT prophylaxis: SCD Code Status: (Full) Family Communication: (Wife ) Disposition Plan: early to detect .   Consultants:   GI consult .  Surgery .  Critical care team.   Antimicrobials: (specify start and planned stop date. Auto populated tables are space occupying and do not give end dates)  Zosyn .   Subjective:  Jeffrey Crane is feeling terrible this morning spiking fevers with severe abdominal tenderness , started to suction more bright blood from his mouth , went hypotensive .  Objective: Vitals:   02/12/17 0334 02/12/17 0801 02/12/17 0955 02/12/17 1202  BP: (!) 99/59 118/76  140/73  Pulse: 72   (!) 116  Resp: (!) 21 (!) 31  (!) 29  Temp: 98.9 F (37.2 C) (!) 104.1 F (40.1 C) (!) 100.8 F (38.2 C) 97.6 F (36.4 C)  TempSrc: Oral Rectal Rectal Oral  SpO2: 100% 96%  100%  Weight:      Height:        Intake/Output Summary (Last 24 hours) at 02/12/17 1331 Last data filed at  02/12/17 0817  Gross per 24 hour  Intake             1756 ml  Output              850 ml  Net              906 ml   Filed Weights   02/12/17 0155  Weight: 52.1 kg (114 lb 13.8 oz)    Examination:  General exam: sever distress . Respiratory system: Clear to auscultation. Respiratory effort normal. Cardiovascular system: Tachycardic , s1s2 . Gastrointestinal system: sever tender to touch , rebound and rigidity were positive . Central nervous system: Alert and oriented. No focal neurological deficits. Extremities: Symmetric 5 x 5 power. Skin: No rashes, lesions or ulcers Psychiatry: Judgement and insight appear normal. Mood & affect appropriate.     Data Reviewed: I have personally reviewed following labs and imaging studies  CBC:  Recent Labs Lab 02/11/17 1950 02/11/17 2003 02/12/17 0646 02/12/17 1029  WBC 10.7*  --  15.6* 26.6*  NEUTROABS 8.7*  --   --   --   HGB 6.8* 7.1* 8.6* 5.7*  HCT 22.3* 21.0* 26.9* 18.0*  MCV 84.8  --  87.1 85.7  PLT 539*  --  662* 563*   Basic Metabolic Panel:  Recent Labs Lab 02/11/17 1950 02/11/17 2003 02/12/17  0646  NA 136 139 137  K 4.0 4.1 4.1  CL 108 108 111  CO2 19*  --  18*  GLUCOSE 139* 134* 128*  BUN 22* 23* 24*  CREATININE 1.01 0.90 0.80  CALCIUM 8.1*  --  7.9*   GFR: Estimated Creatinine Clearance: 75.1 mL/min (by C-G formula based on SCr of 0.8 mg/dL). Liver Function Tests:  Recent Labs Lab 02/11/17 1950  AST 11*  ALT 8*  ALKPHOS 67  BILITOT 0.3  PROT 5.2*  ALBUMIN 2.2*   No results for input(s): LIPASE, AMYLASE in the last 168 hours. No results for input(s): AMMONIA in the last 168 hours. Coagulation Profile:  Recent Labs Lab 02/11/17 1950 02/12/17 0646  INR 1.23 1.26   Cardiac Enzymes: No results for input(s): CKTOTAL, CKMB, CKMBINDEX, TROPONINI in the last 168 hours. BNP (last 3 results) No results for input(s): PROBNP in the last 8760 hours. HbA1C: No results for input(s): HGBA1C in the last  72 hours. CBG:  Recent Labs Lab 02/12/17 0643  GLUCAP 79   Lipid Profile: No results for input(s): CHOL, HDL, LDLCALC, TRIG, CHOLHDL, LDLDIRECT in the last 72 hours. Thyroid Function Tests: No results for input(s): TSH, T4TOTAL, FREET4, T3FREE, THYROIDAB in the last 72 hours. Anemia Panel: No results for input(s): VITAMINB12, FOLATE, FERRITIN, TIBC, IRON, RETICCTPCT in the last 72 hours. Urine analysis:    Component Value Date/Time   COLORURINE YELLOW 10/02/2007 0532   APPEARANCEUR CLEAR 10/02/2007 0532   LABSPEC 1.018 10/02/2007 0532   PHURINE 6.0 10/02/2007 0532   GLUCOSEU NEGATIVE 10/02/2007 0532   HGBUR NEGATIVE 10/02/2007 0532   BILIRUBINUR NEGATIVE 10/02/2007 0532   KETONESUR NEGATIVE 10/02/2007 0532   PROTEINUR NEGATIVE 10/02/2007 0532   UROBILINOGEN 1.0 10/02/2007 0532   NITRITE NEGATIVE 10/02/2007 0532   LEUKOCYTESUR  10/02/2007 0532    NEGATIVE MICROSCOPIC NOT DONE ON URINES WITH NEGATIVE PROTEIN, BLOOD, LEUKOCYTES, NITRITE, OR GLUCOSE <1000 mg/dL.   Sepsis Labs: @LABRCNTIP (procalcitonin:4,lacticidven:4)  ) Recent Results (from the past 240 hour(s))  MRSA PCR Screening     Status: Abnormal   Collection Time: 02/12/17  1:58 AM  Result Value Ref Range Status   MRSA by PCR POSITIVE (A) NEGATIVE Final    Comment:        The GeneXpert MRSA Assay (FDA approved for NASAL specimens only), is one component of a comprehensive MRSA colonization surveillance program. It is not intended to diagnose MRSA infection nor to guide or monitor treatment for MRSA infections. RESULT CALLED TO, READ BACK BY AND VERIFIED WITH: PETTIFORD,A RN [phone] 02/12/17 MITCHELL,L          Radiology Studies: Dg Abd Portable 1v  Result Date: 02/12/2017 CLINICAL DATA:  ABDOMINAL PAIN,HX GASTRECTOMY IN [place] 6-18 TO 6-30.hematemesis. History prior GI bleeding FLAT PLATE ABDOMEN PER DR CORNETT.PT FOR CT SCAN TODAY EXAM: PORTABLE ABDOMEN - 1 VIEW COMPARISON:  Chest x-ray 11/23/2011 FINDINGS:  There is dilatation of numerous small bowel loops in the central abdomen. Surgical clips are identified in the right upper quadrant. No evidence for free intraperitoneal air on this supine views provided. Status post lumbar fusion. IMPRESSION: Findings consistent with partial or early small bowel obstruction. Electronically Signed   By: Norva PavlovElizabeth  Mauri M.D.   On: 02/12/2017 09:32        Scheduled Meds: . ipratropium-albuterol  3 mL Nebulization Q6H  . metoCLOPramide (REGLAN) injection  5 mg Intravenous Q6H  . mupirocin ointment  1 application Nasal BID   Continuous Infusions: . sodium chloride 150  mL/hr at 02/12/17 1029  . sodium chloride    . sodium chloride    . [START ON 02/13/2017] anidulafungin    . anidulafungin 200 mg (02/12/17 1210)  . pantoprozole (PROTONIX) infusion 8 mg/hr (02/12/17 0850)  . piperacillin-tazobactam (ZOSYN)  IV       LOS: 1 day    Time spent: more than 35 minutes .    Efrain Sella, MD [facility] Pager [phone]  If 7PM-7AM, please contact night-coverage [email or web address] Password [email or web address] 02/12/2017, 1:31 PM

## 2017-02-12 NOTE — Sedation Documentation (Signed)
Will monitor for transport back to 253m10, nadine rn received report

## 2017-02-12 NOTE — Progress Notes (Signed)
eLink Physician-Brief Progress Note Patient Name: Jeffrey HolterJames L Jacquot DOB: 06/22/1961 MRN: [id number]   Date of Service  02/12/2017  HPI/Events of Note  Chronic nausea - No improvement with Zofran. Patient requests home Phenergan.   eICU Interventions  Will order: 1. Phenergan 12.5 mg IV Q 6 hours PRN nausea/vomiting.      Intervention Category Major Interventions: Other:  Lenell AntuSommer,Steven Eugene 02/12/2017, 9:51 PM

## 2017-02-12 NOTE — Progress Notes (Signed)
Notified about the CT scan which shows splenic rupture, active bleeding from the splenic vein  Blood pressure 129/81, pulse (!) 114, temperature (!) 97.3 F (36.3 C), temperature source Oral, resp. rate (!) 40, height 5\' 5"  (1.651 m), weight 114 lb 13.8 oz (52.1 kg), SpO2 100 %. Gen:      No acute distress, awake, answers question appropriately. HEENT:  EOMI, sclera anicteric Neck:     No masses; no thyromegaly Lungs:    Clear to auscultation bilaterally; normal respiratory effort CV:         Regular rate and rhythm; no murmurs Abd:      Guarding, tenderness in abd Ext:    No edema; adequate peripheral perfusion Skin:      Warm and dry; no rash Neuro: alert and oriented x 3  Acute bleed from splenic vein Recent gastrectomy.  Repeat Hb is 5.7 Discussed with IR and surgery He will be taken STAT to IR for embolization. Transfuse 2 more units PRBCs Will need a central line but will wait till after IR as we don't want to delay the embolization.  Tranfers to ICU Monitor for intubation needs.   Discussed with the family about the clinical situation and grave prognosis.  The patient is critically ill with multiple organ system failure and requires high complexity decision making for assessment and support, frequent evaluation and titration of therapies, advanced monitoring, review of radiographic studies and interpretation of complex data.   Additional critical care time- 35 mins   Chilton GreathousePraveen Jayro Mcmath MD [facility] Pager [phone] If no answer or after 3pm call: [phone] 02/12/2017, 3:27 PM

## 2017-02-12 NOTE — Sedation Documentation (Signed)
Vital signs stable. 

## 2017-02-12 NOTE — Consult Note (Signed)
Name: Jeffrey Crane MRN: [id number] DOB: [birth year]    ADMISSION DATE:  02/11/2017 CONSULTATION DATE:  02/12/17  REFERRING MD :  Harle Stanford  CHIEF COMPLAINT:  GI Bleed   HISTORY OF PRESENT ILLNESS:  Jeffrey Crane is a 56 y.o. male with a PMH as outlined below.  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  He is currently in [place] visiting family. He had done well post op without complaints.  Drove down to [place] on 02/09/17 and on afternoon of 7/4, he had large amount of hematemesis per report.  He was subsequently brought to ED for further evaluation.  In ED, he was guaiac positive, BP soft at 97/64, Hgb 6.8, PLT 539.  He was transfused 2u PRBC and started on PPI gtt.  He is on warfarin for left atrial thrombus (INR 1.23 on admit).  He denies excessive ASA, NSAID's; but does admit to taking 2 - 3 "goody's" on 7/3 and 7/4 due to headache and abd discomfort.  Denies any current EtOH (remot user) or substance use, current smoker.  He denies hx of GI bleed; however, PMH indicates previous GIB (peptic ulcer disease per notes from [place]).  Also has hx of iron deficiency anemia on intermittent IV therapy.  On AM 7/5, he had oral temp of 76F but rectal of 104.  PCCM was subsequently called for concern of occult sepsis. Repeat temp check was 97.52F oral and 100.8 rectal.  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  has a past surgical history that includes Gastrectomy; Back surgery; Abdominal surgery; Carpal tunnel release; Shoulder surgery; Knee surgery; Cervical fusion; Ulnar nerve repair; Hernia repair; and Small intestine surgery. Prior to Admission medications   Medication Sig Start Date End Date Taking?  Authorizing Provider  albuterol (ACCUNEB) 1.25 MG/3ML nebulizer solution Take 1 ampule by nebulization every 6 (six) hours as needed. For shortness of breath   Yes [provider]  albuterol (PROVENTIL HFA;VENTOLIN HFA) 108 (90 BASE) MCG/ACT inhaler Inhale 2 puffs into the lungs every 6 (six) hours as needed. For shortness of breath   Yes [provider]  cyclobenzaprine (FLEXERIL) 10 MG tablet Take 10 mg by mouth 3 (three) times daily as needed. For muscle spasm   Yes [provider]  fluticasone (FLONASE) 50 MCG/ACT nasal spray Place 2 sprays into the nose 2 (two) times daily.   Yes [provider]  gabapentin (NEURONTIN) 300 MG capsule Take 300 mg by mouth daily.   Yes [provider]  HYDROcodone-acetaminophen (NORCO) 10-325 MG per tablet Take 1 tablet by mouth every 6 (six) hours as needed. For pain   Yes [provider]  LORazepam (ATIVAN) 0.5 MG tablet Take 0.5 mg by mouth every 8 (eight) hours. For anxiety   Yes [provider]  pantoprazole (PROTONIX) 20 MG tablet Take 20 mg by mouth daily.   Yes [provider]  promethazine (PHENERGAN) 25 MG tablet Take 25 mg by mouth every 6 (six) hours as needed. For nausea   Yes [provider]  Tiotropium Bromide Monohydrate (SPIRIVA RESPIMAT) 1.25 MCG/ACT AERS Inhale 2 puffs into the lungs daily.   Yes [provider]   Allergies  Allergen Reactions  . Famciclovir Rash  . Aspirin Other (See Comments)  Bleeding ulcer  . Avelox [Moxifloxacin Hcl In Nacl] Hives  . Chlorhexidine Gluconate Hives    FAMILY HISTORY:  family history includes COPD in his mother and sister; Colitis in his mother; Colon cancer in his brother; Congestive Heart Failure in his father; Diabetes Mellitus II in his brother, father, sister, and sister; Emphysema in his father; Hypertension in his sister and sister; Neuropathy in his brother and sister; Osteoporosis in his sister; Psoriasis  in his sister; Rheum arthritis in his sister; Stroke in his mother; Transient ischemic attack in his sister. SOCIAL HISTORY:  reports that he quit smoking about 7 years ago. He does not have any smokeless tobacco history on file. He reports that he does not drink alcohol or use drugs.  REVIEW OF SYSTEMS:   All negative; except for those that are bolded, which indicate positives.  Constitutional: weight loss, weight gain, night sweats, fevers, chills, fatigue, weakness.  HEENT: headaches, sore throat, sneezing, nasal congestion, post nasal drip, difficulty swallowing, tooth/dental problems, visual complaints, visual changes, ear aches. Neuro: difficulty with speech, weakness, numbness, ataxia. CV:  chest pain, orthopnea, PND, swelling in lower extremities, dizziness, palpitations, syncope.  Resp: cough, hemoptysis, dyspnea, wheezing. GI: heartburn, indigestion, abdominal pain, nausea, vomiting, diarrhea, constipation, change in bowel habits, loss of appetite, hematemesis, melena, hematochezia.  GU: dysuria, change in color of urine, urgency or frequency, flank pain, hematuria. MSK: joint pain or swelling, decreased range of motion. Psych: change in mood or affect, depression, anxiety, suicidal ideations, homicidal ideations. Skin: rash, itching, bruising.   SUBJECTIVE:  Spitting up small amounts of blood and using yankauer to suction it.  Has intermittent abdominal pain.  Denies chest pain, SOB.  VITAL SIGNS: Temp:  [97.9 F (36.6 C)-104.1 F (40.1 C)] 104.1 F (40.1 C) (07/05 0801) Pulse Rate:  [69-94] 72 (07/05 0334) Resp:  [16-31] 31 (07/05 0801) BP: (85-118)/(48-76) 118/76 (07/05 0801) SpO2:  [96 %-100 %] 96 % (07/05 0801) Weight:  [52.1 kg (114 lb 13.8 oz)] 52.1 kg (114 lb 13.8 oz) (07/05 0155)  PHYSICAL EXAMINATION: General: Adult male, resting in bed, in NAD though complains of intermittent abdominal pain. Neuro: A&O x 3, non-focal.  HEENT: [place]/AT. PERRL, sclerae  anicteric. Cardiovascular: RRR, no M/R/G.  Lungs: Respirations even and unlabored.  CTA bilaterally, No W/R/R. Abdomen: BS hypoactive, soft, tender.  Midline incisions with dressings C/D/I. Musculoskeletal: No gross deformities, no edema.  Skin: Intact, warm, no rashes.    Recent Labs Lab 02/11/17 1950 02/11/17 2003 02/12/17 0646  NA 136 139 137  K 4.0 4.1 4.1  CL 108 108 111  CO2 19*  --  18*  BUN 22* 23* 24*  CREATININE 1.01 0.90 0.80  GLUCOSE 139* 134* 128*    Recent Labs Lab 02/11/17 1950 02/11/17 2003 02/12/17 0646  HGB 6.8* 7.1* 8.6*  HCT 22.3* 21.0* 26.9*  WBC 10.7*  --  15.6*  PLT 539*  --  662*   Dg Abd Portable 1v  Result Date: 02/12/2017 CLINICAL DATA:  ABDOMINAL PAIN,HX GASTRECTOMY IN [place] 6-18 TO 6-30.hematemesis. History prior GI bleeding FLAT PLATE ABDOMEN PER DR CORNETT.PT FOR CT SCAN TODAY EXAM: PORTABLE ABDOMEN - 1 VIEW COMPARISON:  Chest x-ray 11/23/2011 FINDINGS: There is dilatation of numerous small bowel loops in the central abdomen. Surgical clips are identified in the right upper quadrant. No evidence for free intraperitoneal air on this supine views provided. Status post lumbar fusion. IMPRESSION: Findings consistent with partial or early small bowel obstruction. Electronically Signed   By: Lanora Manis  Manson PasseyBrown M.D.   On: 02/12/2017 09:32    STUDIES:  AXR 7/5 > ? Partial or early SBO. CT A / P 7/5 >   SIGNIFICANT EVENTS  7/4 > admit. 7/5 > PCCM consult.  ASSESSMENT / PLAN:  GI Bleed with possibility of developing early acute intra abdominal process vs sepsis (abscess, perf, etc) - hemodynamically and clinically stable; no role for ICU at this time. Plan: Agree hold preadmission warfarin. Continue PPI gtt. STAT CT abd / pelvis with oral and IV contrast. Assess lactate, PCT. Continue empiric abx. Send blood cultures. Surgery and GI consulted and following.  Anemia - acute blood loss as well as hx of Fe deficiency (on intermittent IV  therapy). Plan: Follow serial H/H. Transfuse for Hgb < 7.  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 Plan: Hold warfarin.  Hx COPD. Plan: Start Albuterol, DuoNebs.   Follow CT abdomen / pelvis.  Appears stable for SDU for now.  If worsens then will need transfer to ICU.   Rutherford Guysahul Desai, GeorgiaPA - C [facility] Pager: [phone]  or [phone] 02/12/2017, 9:53 AM

## 2017-02-12 NOTE — Progress Notes (Signed)
Initial Nutrition Assessment  DOCUMENTATION CODES:   Severe malnutrition in context of chronic illness  INTERVENTION:   -RD will follow for diet advancement and supplement as appropriate  NUTRITION DIAGNOSIS:   Malnutrition (Severe) related to chronic illness (gastric dysmostility s/p gastrectomy) as evidenced by severe depletion of body fat, severe depletion of muscle mass, percent weight loss, energy intake < 75% for > or equal to 1 month.  GOAL:   Patient will meet greater than or equal to 90% of their needs  MONITOR:   PO intake, Supplement acceptance, Diet advancement, Labs, Weight trends, Skin, I & O's  REASON FOR ASSESSMENT:   Consult, Malnutrition Screening Tool Assessment of nutrition requirement/status  ASSESSMENT:   Jeffrey Crane is a 56 y.o. male with medical history significant of previous GI bleed, hypoglycemia, left atrium thrombosis on warfarin, previous TIA, history of small bowel resection, who is status post gastrectomy in [place] last month (01-26-2017 to 02-07-2017) who just traveled to the area by private vehicle two days ago and was tolerating increasing amounts and consistency of food since procedure until today in the afternoon when he had a significant amount of hematemesis this afternoon around 1500  Pt admitted with UGI bleed.   Pt very agitated at time of visit. Hx obtained from pt wife at bedside. Pt wife reveals that pt has an extensive GI hx, including a recent gastrectomy in [place], which pt received due to severe gastric motility issues. Wife describes that pt would experience early satiety with eating; stomach would fill with fluid and then pt would experience dumping syndrome. Pt has also had issues with bowel movements and has been on miralax.   Pt's intake has been minimal over the last several months. Typical meal intake is bites and sips- commonly consumed foods include ramen noodles and scrambled eggs, however, pt has mainly been drinking water  and G2gatorade to prevent dehydration. Pt wife reports that pt was also consuming a high protein, low sugar supplements, but unable to recall name of supplement (suspect supplement is NeoPro (140 kcals, 35 grams of protein) vs Isopure Zero (60 kcals, 40 grams protein) vs Premier Protein Clear (90 kcals, 20 grams protein)). Pt wife aware that particular supplement is likely not on formulary, but interested in equivalent once diet is advanced.   [facility]where. [facility], wt was 131#, which demonstrates a 17# (13%) wt loss over the past 3 months, which is significant for time frame.  Limited Nutrition-Focused physical exam completed, due to aggitation. Findings are severe fat depletion, severe muscle depletion, and no edema.   Labs reviewed.   Diet Order:  Diet NPO time specified  Skin:   (closed abdominal incision)  Last BM:  02/11/17  Height:   Ht Readings from Last 1 Encounters:  02/12/17 5\' 5"  (1.651 m)    Weight:   Wt Readings from Last 1 Encounters:  02/12/17 114 lb 13.8 oz (52.1 kg)    Ideal Body Weight:  61.8 kg  BMI:  Body mass index is 19.11 kg/m.  Estimated Nutritional Needs:   Kcal:  1800-2000  Protein:  100-115 grams  Fluid:  1.8-2.0 L  EDUCATION NEEDS:   Education needs addressed  Inaya Gillham A. Mayford KnifeWilliams, RD, LDN, CDE Pager: [phone] After hours Pager: [phone]

## 2017-02-12 NOTE — Progress Notes (Signed)
eLink Physician-Brief Progress Note Patient Name: Jeffrey HolterJames [name] DOB: Sep 16, 1960 MRN: [id number]   Date of Service  02/12/2017  HPI/Events of Note  Fever to 101.4 F rectally - Patient is currently on Zosyn and Eraxis. CXR earlier today reveals no evidence of pneumonia. Source is very likely intra-abdominal. Tylenol is already ordered.   eICU Interventions  Will order: 1. Blood cultures X 2 now.  2. Vancomycin per pharmacy consultation.      Intervention Category Major Interventions: Infection - evaluation and management  Sommer,Steven Eugene 02/12/2017, 10:08 PM

## 2017-02-12 NOTE — Consult Note (Signed)
Chief Complaint: Patient was seen in consultation today for mesenteric arteriogram with possible splenic artery embolization Chief Complaint  Patient presents with  . GI Bleeding  . Hypotension   at the request of Dr Wynn Banker  Referring Physician(s): Dr Wynn Banker  Supervising Physician: Simonne Come  Patient Status: [facility] - In-pt  History of Present Illness: [facility] is a 56 y.o. 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 Splenic rupture with large volume active extravasation throughout the left upper quadrant, likely arising from distal splenic artery branches. 2. Apparent communication of hemorrhage into the stomach. 3. Bilateral heterogeneous renal enhancement may relate to pyelonephritis. 4. Tiny bilateral pleural effusions. 5. Bilateral femoral head avascular necrosis  Request now for emergent arteriogram with possible splenic artery embolization Dr Grace Isaac has reviewed imaging- pt to come to IR ASAP  Past Medical History:  Diagnosis Date  . Acid reflux   . Anemia   . COPD (chronic obstructive pulmonary disease) (HCC)   . GI bleeding   . Hypoglycemia   . Iron deficiency   . Sarcoidosis   . 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 TIA (transient ischemic attack)    x3    Past Surgical History:  Procedure Laterality Date  . ABDOMINAL SURGERY    . BACK SURGERY    . CARPAL TUNNEL RELEASE    . CERVICAL FUSION    . CHOLECYSTECTOMY    . GASTRECTOMY    . HERNIA REPAIR     Abdominal wall  . KNEE SURGERY    . SHOULDER SURGERY    . SMALL INTESTINE SURGERY    . ULNAR NERVE REPAIR      Allergies: Famciclovir; Aspirin; Avelox [moxifloxacin hcl in nacl]; and Chlorhexidine  gluconate  Medications: Prior to Admission medications   Medication Sig Start Date End Date Taking? Authorizing Provider  albuterol (ACCUNEB) 1.25 MG/3ML nebulizer solution Take 1 ampule by nebulization every 6 (six) hours as needed. For shortness of breath   Yes [provider]  albuterol (PROVENTIL HFA;VENTOLIN HFA) 108 (90 BASE) MCG/ACT inhaler Inhale 2 puffs into the lungs every 6 (six) hours as needed. For shortness of breath   Yes [provider]  cyclobenzaprine (FLEXERIL) 10 MG tablet Take 10 mg by mouth 3 (three) times daily as needed. For muscle spasm   Yes [provider]  fluticasone (FLONASE) 50 MCG/ACT nasal spray Place 2 sprays into the nose 2 (two) times daily.   Yes [provider]  gabapentin (NEURONTIN) 300 MG capsule Take 300 mg by mouth daily.   Yes [provider]  HYDROcodone-acetaminophen (NORCO) 10-325 MG per tablet Take 1 tablet by mouth every 6 (six) hours as needed. For pain   Yes [provider]  LORazepam (ATIVAN) 0.5 MG tablet Take 0.5 mg by mouth every 8 (eight) hours. For anxiety   Yes [provider]  pantoprazole (PROTONIX) 20 MG tablet Take 20 mg by mouth daily.   Yes [provider]  promethazine (PHENERGAN) 25 MG tablet Take 25 mg by mouth every 6 (six) hours as needed. For nausea   Yes [provider]  Tiotropium Bromide Monohydrate (SPIRIVA RESPIMAT) 1.25 MCG/ACT AERS Inhale 2 puffs into the lungs daily.   Yes [provider]  Family History  Problem Relation Age of Onset  . COPD Mother   . Stroke Mother   . Colitis Mother        cdiff colitis  . Emphysema Father   . Diabetes Mellitus II Father   . Congestive Heart Failure Father   . Rheum arthritis Sister   . Diabetes Mellitus II Sister   . COPD Sister   . Hypertension Sister   . Diabetes Mellitus II Brother   . Neuropathy Brother   . Colon cancer Brother   . Diabetes Mellitus II Sister   .  Hypertension Sister   . Psoriasis Sister   . Osteoporosis Sister   . Neuropathy Sister   . Transient ischemic attack Sister     Social History   Social History  . Marital status: Married    Spouse name: N/A  . Number of children: N/A  . Years of education: N/A   Social History Main Topics  . Smoking status: Former Smoker    Quit date: 2018  . Smokeless tobacco: Never Used  . Alcohol use No  . Drug use: No  . Sexual activity: Not Asked   Other Topics Concern  . None   Social History Narrative  . None    Review of Systems: A 12 point ROS discussed and pertinent positives are indicated in the HPI above.  All other systems are negative.  Review of Systems  Constitutional: Positive for activity change, appetite change and fever.  Respiratory: Negative for cough and shortness of breath.   Cardiovascular: Negative for chest pain.  Gastrointestinal: Positive for anal bleeding, blood in stool, nausea and vomiting.  Musculoskeletal: Negative for back pain.  Neurological: Positive for weakness.  Psychiatric/Behavioral: Negative for behavioral problems and confusion.    Vital Signs: BP 129/81 (BP Location: Left Arm)   Pulse (!) 114   Temp (!) 97.3 F (36.3 C) (Oral)   Resp (!) 40   Ht 5\' 5"  (1.651 m)   Wt 114 lb 13.8 oz (52.1 kg)   SpO2 100%   BMI 19.11 kg/m   Physical Exam  Constitutional: He is oriented to person, place, and time.  Cardiovascular: Normal rate and regular rhythm.   Pulmonary/Chest: Effort normal and breath sounds normal.  Abdominal: He exhibits distension. There is tenderness.  Musculoskeletal: Normal range of motion.  Neurological: He is alert and oriented to person, place, and time.  Skin: Skin is warm and dry.  Psychiatric: He has a normal mood and affect. His behavior is normal. Judgment and thought content normal.  Wife at bedside Pt does agree to procedure---understands procedure and risks Wife has signed consent (HCPOA)   Nursing note  and vitals reviewed.   Mallampati Score:  MD Evaluation Airway: WNL Heart: WNL Abdomen: WNL Chest/ Lungs: WNL ASA  Classification: 3 Mallampati/Airway Score: One  Imaging: Ct Abdomen Pelvis W Contrast  Result Date: 02/12/2017 CLINICAL DATA:  Left-sided pain and hemoptysis since yesterday. Gastroparesis surgery 2 weeks ago. GI bleeding. EXAM: CT ABDOMEN AND PELVIS WITH CONTRAST TECHNIQUE: Multidetector CT imaging of the abdomen and pelvis was performed using the standard protocol following bolus administration of intravenous contrast. CONTRAST:  ISOVUE-300 IOPAMIDOL (ISOVUE-300) INJECTION 61% COMPARISON:  Plain films of earlier today.  No prior CT. FINDINGS: Lower chest: Minimal motion degradation at the lung bases. Subsegmental atelectasis at the left lung base. Normal heart size with right coronary artery atherosclerosis. Trace right and small left pleural effusion. Hepatobiliary: Normal liver. Cholecystectomy, without biliary ductal dilatation. Pancreas:  Normal, without mass or ductal dilatation. Pancreas is displaced anteriorly by left upper quadrant blood. Spleen: Splenic rupture, as evidenced by perisplenic hemorrhage and areas of hypoattenuation throughout the spleen. Active extravasation of large volume left upper quadrant hemorrhage, likely from the splenic artery. Example image 16/series 3 and on delayed images. Adrenals/Urinary Tract: Normal adrenal glands. Heterogeneous enhancement involves both kidneys, most apparent on delayed images. No hydronephrosis. Normal urinary bladder. Stomach/Bowel: Surgical changes about the proximal stomach. The stomach is difficult to delineate from the surrounding hemorrhage. Suspicion of direct extension of blood into the stomach, including on image 17/series 3. No gastric obstruction. Normal colon and terminal ileum. Normal small bowel caliber. Prior enterotomy. No free intraperitoneal air. Vascular/Lymphatic: Aortic and branch vessel atherosclerosis.  No abdominopelvic adenopathy. Reproductive: Normal prostate. Other: Relatively small volume pelvic fluid with mild complexity, likely hemorrhage. Moderate volume intraperitoneal and retroperitoneal hemorrhage throughout the abdomen. Fluid containing periumbilical hernia. Musculoskeletal: Bilateral femoral head avascular necrosis. Posterior fixation at L4-5. IMPRESSION: 1. Splenic rupture with large volume active extravasation throughout the left upper quadrant, likely arising from distal splenic artery branches. 2. Apparent communication of hemorrhage into the stomach. 3. Bilateral heterogeneous renal enhancement may relate to pyelonephritis. 4. Tiny bilateral pleural effusions. 5. Bilateral femoral head avascular necrosis. Critical test results telephoned to.Dr. Molli KnockYacoub. at the time of interpretation at 2:48 p.m.on 02/12/2017. Electronically Signed   By: Jeronimo GreavesKyle  Talbot M.D.   On: 02/12/2017 14:57   Dg Abd Portable 1v  Result Date: 02/12/2017 CLINICAL DATA:  ABDOMINAL PAIN,HX GASTRECTOMY IN [place] 6-18 TO 6-30.hematemesis. History prior GI bleeding FLAT PLATE ABDOMEN PER DR CORNETT.PT FOR CT SCAN TODAY EXAM: PORTABLE ABDOMEN - 1 VIEW COMPARISON:  Chest x-ray 11/23/2011 FINDINGS: There is dilatation of numerous small bowel loops in the central abdomen. Surgical clips are identified in the right upper quadrant. No evidence for free intraperitoneal air on this supine views provided. Status post lumbar fusion. IMPRESSION: Findings consistent with partial or early small bowel obstruction. Electronically Signed   By: Norva PavlovElizabeth  Veselka M.D.   On: 02/12/2017 09:32    Labs:  CBC:  Recent Labs  02/11/17 1950 02/11/17 2003 02/12/17 0646 02/12/17 1029  WBC 10.7*  --  15.6* 26.6*  HGB 6.8* 7.1* 8.6* 5.7*  HCT 22.3* 21.0* 26.9* 18.0*  PLT 539*  --  662* 563*    COAGS:  Recent Labs  02/11/17 1950 02/12/17 0646  INR 1.23 1.26  APTT 40*  --     BMP:  Recent Labs  02/11/17 1950 02/11/17 2003  02/12/17 0646  NA 136 139 137  K 4.0 4.1 4.1  CL 108 108 111  CO2 19*  --  18*  GLUCOSE 139* 134* 128*  BUN 22* 23* 24*  CALCIUM 8.1*  --  7.9*  CREATININE 1.01 0.90 0.80  GFRNONAA >60  --  >60  GFRAA >60  --  >60    LIVER FUNCTION TESTS:  Recent Labs  02/11/17 1950  BILITOT 0.3  AST 11*  ALT 8*  ALKPHOS 67  PROT 5.2*  ALBUMIN 2.2*    TUMOR MARKERS: No results for input(s): AFPTM, CEA, CA199, CHROMGRNA in the last 8760 hours.  Assessment and Plan:  Active GI bleed CT shows splenic artery extravasation; splenic rupture For emergent mesenteric arteriogram with possible splenic artery embolization Risks and Benefits discussed with the patient including, but not limited to bleeding, infection, vascular injury or contrast induced renal failure. All of the patient's questions were answered, patient is agreeable to proceed. Consent  signed and in chart.   Thank you for this interesting consult.  I greatly enjoyed meeting Jeffrey Crane and look forward to participating in their care.  A copy of this report was sent to the requesting provider on this date.  Electronically Signed: Robet Leu, PA-C 02/12/2017, 3:13 PM   I spent a total of 40 Minutes    in face to face in clinical consultation, greater than 50% of which was counseling/coordinating care for splenic artery embolization

## 2017-02-12 NOTE — ED Notes (Signed)
Attempted to call report

## 2017-02-12 NOTE — Consult Note (Signed)
[facility] Consult Note  Jeffrey Crane 1961/03/07  [id number].    Requesting MD: Waldron Session MD Chief Complaint/Reason for Consult: Peritoneal signs and concern for perforation  HPI:  Patient with 2 days of hematemesis and abdominal pain. Hx of UGI bleed w/o perforation. Recent gastrectomy 01/26/17 in [place], [place] where patient lives. Gastrectomy was because "things quit moving and weren't working", but patient  He is in town visiting family members. Patient describes abdominal pain as intermittent and sharp in nature, associated nausea, subjective fevers and diaphoresis. Pain has progressively worsened. Denies chest pain, palpitations, SOB, dizziness, or LOC. Last meal was a few days ago. Surgical hx: recent gastrectomy, "small bowel surgery", hernia repair Patient is allergic to CHG. Patient is on Warfarin for hx of TIA.   ROS:  Review of Systems  Constitutional: Positive for diaphoresis and fever. Negative for chills and malaise/fatigue.  Respiratory: Negative for hemoptysis, shortness of breath and wheezing.   Cardiovascular: Negative for chest pain and palpitations.  Gastrointestinal: Positive for abdominal pain, nausea and vomiting (hematemesis). Negative for blood in stool, constipation, diarrhea and melena.  Neurological: Negative for dizziness and loss of consciousness.  Endo/Heme/Allergies: Bruises/bleeds easily (on warfarin for hx of TIA).  All other systems reviewed and are negative.   Family History  Problem Relation Age of Onset  . COPD Mother   . Stroke Mother   . Colitis Mother        cdiff colitis  . Emphysema Father   . Diabetes Mellitus II Father   . Congestive Heart Failure Father   . Rheum arthritis Sister   . Diabetes Mellitus II Sister   . COPD Sister   . Hypertension Sister   . Diabetes Mellitus II Brother   . Neuropathy Brother   . Colon cancer Brother   . Diabetes Mellitus II Sister   . Hypertension Sister   . Psoriasis Sister   .  Osteoporosis Sister   . Neuropathy Sister   . Transient ischemic attack Sister     Past Medical History:  Diagnosis Date  . Acid reflux   . COPD (chronic obstructive pulmonary disease) ([place])   . GI bleeding   . Hypoglycemia   . 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 TIA (transient ischemic attack)    x3    Past Surgical History:  Procedure Laterality Date  . ABDOMINAL SURGERY    . BACK SURGERY    . CARPAL TUNNEL RELEASE    . CERVICAL FUSION    . GASTRECTOMY    . HERNIA REPAIR     Abdominal wall  . KNEE SURGERY    . SHOULDER SURGERY    . SMALL INTESTINE SURGERY    . ULNAR NERVE REPAIR      Social History:  reports that he quit smoking about 7 years ago. He does not have any smokeless tobacco history on file. He reports that he does not drink alcohol or use drugs.  Allergies:  Allergies  Allergen Reactions  . Famciclovir Rash  . Aspirin Other (See Comments)    Bleeding ulcer  . Avelox [Moxifloxacin Hcl In Nacl] Hives  . Chlorhexidine Gluconate Hives    Medications Prior to Admission  Medication Sig Dispense Refill  . albuterol (ACCUNEB) 1.25 MG/3ML nebulizer solution Take 1 ampule by nebulization every 6 (six) hours as needed. For shortness of breath    . albuterol (PROVENTIL HFA;VENTOLIN HFA) 108 (90 BASE) MCG/ACT inhaler Inhale 2 puffs into the lungs  every 6 (six) hours as needed. For shortness of breath    . cyclobenzaprine (FLEXERIL) 10 MG tablet Take 10 mg by mouth 3 (three) times daily as needed. For muscle spasm    . fluticasone (FLONASE) 50 MCG/ACT nasal spray Place 2 sprays into the nose 2 (two) times daily.    Marland Kitchen gabapentin (NEURONTIN) 300 MG capsule Take 300 mg by mouth daily.    Marland Kitchen HYDROcodone-acetaminophen (NORCO) 10-325 MG per tablet Take 1 tablet by mouth every 6 (six) hours as needed. For pain    . LORazepam (ATIVAN) 0.5 MG tablet Take 0.5 mg by mouth every 8 (eight) hours. For anxiety    . pantoprazole  (PROTONIX) 20 MG tablet Take 20 mg by mouth daily.    . promethazine (PHENERGAN) 25 MG tablet Take 25 mg by mouth every 6 (six) hours as needed. For nausea    . Tiotropium Bromide Monohydrate (SPIRIVA RESPIMAT) 1.25 MCG/ACT AERS Inhale 2 puffs into the lungs daily.      Blood pressure 118/76, pulse 72, temperature (!) 104.1 F (40.1 C), temperature source Rectal, resp. rate (!) 31, height _0  (1.651 m), weight 52.1 kg (114 lb 13.8 oz), SpO2 96 %. Physical Exam: Physical Exam  Constitutional: He is oriented to person, place, and time. He appears well-developed and well-nourished. He is cooperative. He has a sickly appearance.  Patient is in obvious discomfort.  HENT:  Head: Normocephalic and atraumatic.  Right Ear: External ear normal.  Left Ear: External ear normal.  Nose: Nose normal.  Mouth/Throat: Mucous membranes are not pale, dry and not cyanotic.  Eyes: Conjunctivae and lids are normal. No scleral icterus.  Pupils equal and round  Neck: Trachea normal and normal range of motion. Neck supple.  Cardiovascular: Regular rhythm, S1 normal and S2 normal.  Tachycardia present.   Pulses:      Radial pulses are 2+ on the right side, and 2+ on the left side.       Dorsalis pedis pulses are 2+ on the right side, and 2+ on the left side.  Pulmonary/Chest: Effort normal and breath sounds normal. He has no decreased breath sounds. He has no wheezes. He has no rhonchi. He has no rales.  Abdominal: Soft. He exhibits no distension. Bowel sounds are decreased. There is no hepatosplenomegaly. There is generalized tenderness (Tender on percussion of abdomen). There is rebound. There is no rigidity and no guarding.  Midline surgical scar from recent gastrectomy is well healing.   Musculoskeletal: Normal range of motion.  No gross deformities or edema.   Neurological: He is alert and oriented to person, place, and time. He has normal strength. No sensory deficit.  Skin: Skin is warm and intact. He is  diaphoretic. There is pallor.  Psychiatric: His behavior is normal. Judgment normal. His mood appears anxious.    Results for orders placed or performed during the hospital encounter of 02/11/17 (from the past 48 hour(s))  CBC with Differential     Status: Abnormal   Collection Time: 02/11/17  7:50 PM  Result Value Ref Range   WBC 10.7 (H) 4.0 - 10.5 K/uL   RBC 2.63 (L) 4.22 - 5.81 MIL/uL   Hemoglobin 6.8 (LL) 13.0 - 17.0 g/dL    Comment: REPEATED TO VERIFY CRITICAL RESULT CALLED TO, READ BACK BY AND VERIFIED WITH: BErasmo Score RN [id number] GREEN R    HCT 22.3 (L) 39.0 - 52.0 %   MCV 84.8 78.0 - 100.0 fL   MCH 25.9 (L)  26.0 - 34.0 pg   MCHC 30.5 30.0 - 36.0 g/dL   RDW 18.2 (H) 11.5 - 15.5 %   Platelets 539 (H) 150 - 400 K/uL   Neutrophils Relative % 81 %   Neutro Abs 8.7 (H) 1.7 - 7.7 K/uL   Lymphocytes Relative 11 %   Lymphs Abs 1.2 0.7 - 4.0 K/uL   Monocytes Relative 7 %   Monocytes Absolute 0.8 0.1 - 1.0 K/uL   Eosinophils Relative 0 %   Eosinophils Absolute 0.0 0.0 - 0.7 K/uL   Basophils Relative 0 %   Basophils Absolute 0.0 0.0 - 0.1 K/uL  Comprehensive metabolic panel     Status: Abnormal   Collection Time: 02/11/17  7:50 PM  Result Value Ref Range   Sodium 136 135 - 145 mmol/L   Potassium 4.0 3.5 - 5.1 mmol/L   Chloride 108 101 - 111 mmol/L   CO2 19 (L) 22 - 32 mmol/L   Glucose, Bld 139 (H) 65 - 99 mg/dL   BUN 22 (H) 6 - 20 mg/dL   Creatinine, Ser 1.01 0.61 - 1.24 mg/dL   Calcium 8.1 (L) 8.9 - 10.3 mg/dL   Total Protein 5.2 (L) 6.5 - 8.1 g/dL   Albumin 2.2 (L) 3.5 - 5.0 g/dL   AST 11 (L) 15 - 41 U/L   ALT 8 (L) 17 - 63 U/L   Alkaline Phosphatase 67 38 - 126 U/L   Total Bilirubin 0.3 0.3 - 1.2 mg/dL   GFR calc non Af Amer >60 >60 mL/min   GFR calc Af Amer >60 >60 mL/min    Comment: (NOTE) The eGFR has been calculated using the CKD EPI equation. This calculation has not been validated in all clinical situations. eGFR's persistently <60 mL/min signify possible  Chronic Kidney Disease.    Anion gap 9 5 - 15  Protime-INR     Status: Abnormal   Collection Time: 02/11/17  7:50 PM  Result Value Ref Range   Prothrombin Time 15.6 (H) 11.4 - 15.2 seconds   INR 1.23   APTT     Status: Abnormal   Collection Time: 02/11/17  7:50 PM  Result Value Ref Range   aPTT 40 (H) 24 - 36 seconds    Comment:        IF BASELINE aPTT IS ELEVATED, SUGGEST PATIENT RISK ASSESSMENT BE USED TO DETERMINE APPROPRIATE ANTICOAGULANT THERAPY.   Type and screen [place]     Status: None (Preliminary result)   Collection Time: 02/11/17  7:50 PM  Result Value Ref Range   ABO/RH(D) O POS    Antibody Screen NEG    Sample Expiration 02/14/2017    Unit Number [id number]    Blood Component Type RBC LR PHER2    Unit division 00    Status of Unit ISSUED,FINAL    Transfusion Status OK TO TRANSFUSE    Crossmatch Result Compatible    Unit Number [id number]    Blood Component Type RED CELLS,LR    Unit division 00    Status of Unit ISSUED    Transfusion Status OK TO TRANSFUSE    Crossmatch Result Compatible    Unit Number [id number]    Blood Component Type RBC LR PHER2    Unit division 00    Status of Unit ALLOCATED    Transfusion Status OK TO TRANSFUSE    Crossmatch Result Compatible    Unit Number [id number]    Blood Component Type RED CELLS,LR  Unit division 00    Status of Unit ALLOCATED    Transfusion Status OK TO TRANSFUSE    Crossmatch Result Compatible   ABO/Rh     Status: None   Collection Time: 02/11/17  7:50 PM  Result Value Ref Range   ABO/RH(D) O POS   I-Stat Troponin, ED (not at [facility] [place])     Status: None   Collection Time: 02/11/17  8:02 PM  Result Value Ref Range   Troponin i, poc 0.00 0.00 - 0.08 ng/mL   Comment 3            Comment: Due to the release kinetics of cTnI, a negative result within the first hours of the onset of symptoms does not rule out myocardial infarction with certainty. If myocardial infarction  is still suspected, repeat the test at appropriate intervals.   POC occult blood, ED     Status: Abnormal   Collection Time: 02/11/17  8:03 PM  Result Value Ref Range   Fecal Occult Bld POSITIVE (A) NEGATIVE  I-Stat Chem 8, ED     Status: Abnormal   Collection Time: 02/11/17  8:03 PM  Result Value Ref Range   Sodium 139 135 - 145 mmol/L   Potassium 4.1 3.5 - 5.1 mmol/L   Chloride 108 101 - 111 mmol/L   BUN 23 (H) 6 - 20 mg/dL   Creatinine, Ser 0.90 0.61 - 1.24 mg/dL   Glucose, Bld 134 (H) 65 - 99 mg/dL   Calcium, Ion 1.11 (L) 1.15 - 1.40 mmol/L   TCO2 20 0 - 100 mmol/L   Hemoglobin 7.1 (L) 13.0 - 17.0 g/dL   HCT 21.0 (L) 39.0 - 52.0 %  Prepare RBC     Status: None   Collection Time: 02/11/17 10:00 PM  Result Value Ref Range   Order Confirmation ORDER PROCESSED BY BLOOD BANK   MRSA PCR Screening     Status: Abnormal   Collection Time: 02/12/17  1:58 AM  Result Value Ref Range   MRSA by PCR POSITIVE (A) NEGATIVE    Comment:        The GeneXpert MRSA Assay (FDA approved for NASAL specimens only), is one component of a comprehensive MRSA colonization surveillance program. It is not intended to diagnose MRSA infection nor to guide or monitor treatment for MRSA infections. RESULT CALLED TO, READ BACK BY AND VERIFIED WITH: PETTIFORD,A RN 0459 02/12/17 MITCHELL,L   Glucose, capillary     Status: None   Collection Time: 02/12/17  6:43 AM  Result Value Ref Range   Glucose-Capillary 79 65 - 99 mg/dL  CBC     Status: Abnormal   Collection Time: 02/12/17  6:46 AM  Result Value Ref Range   WBC 15.6 (H) 4.0 - 10.5 K/uL   RBC 3.09 (L) 4.22 - 5.81 MIL/uL   Hemoglobin 8.6 (L) 13.0 - 17.0 g/dL   HCT 26.9 (L) 39.0 - 52.0 %   MCV 87.1 78.0 - 100.0 fL   MCH 27.8 26.0 - 34.0 pg   MCHC 32.0 30.0 - 36.0 g/dL   RDW 17.5 (H) 11.5 - 15.5 %   Platelets 662 (H) 150 - 400 K/uL  Basic metabolic panel     Status: Abnormal   Collection Time: 02/12/17  6:46 AM  Result Value Ref Range   Sodium  137 135 - 145 mmol/L   Potassium 4.1 3.5 - 5.1 mmol/L   Chloride 111 101 - 111 mmol/L   CO2 18 (L) 22 - 32 mmol/L  Glucose, Bld 128 (H) 65 - 99 mg/dL   BUN 24 (H) 6 - 20 mg/dL   Creatinine, Ser 0.80 0.61 - 1.24 mg/dL   Calcium 7.9 (L) 8.9 - 10.3 mg/dL   GFR calc non Af Amer >60 >60 mL/min   GFR calc Af Amer >60 >60 mL/min    Comment: (NOTE) The eGFR has been calculated using the CKD EPI equation. This calculation has not been validated in all clinical situations. eGFR's persistently <60 mL/min signify possible Chronic Kidney Disease.    Anion gap 8 5 - 15  Protime-INR     Status: Abnormal   Collection Time: 02/12/17  6:46 AM  Result Value Ref Range   Prothrombin Time 15.9 (H) 11.4 - 15.2 seconds   INR 1.26      Assessment/Plan Hx of TIA on wafarin Hx of left atrial appendage thrombus w/o MI COPD Hypoalbuminemia  UGI Bleed Continue IVF/NPO, IV abx and IV protonix AXR and CT abd pending MD to see and make further recommendations Anemia - transfused 2 units PRBC    Brigid Re, [facility] Surgery 02/12/2017, 8:34 AM Pager: [phone] Consults: [phone] Mon-Fri 7:00 am-4:30 pm Sat-Sun 7:00 am-11:30 am

## 2017-02-12 NOTE — Procedures (Signed)
Pre-procedure Diagnosis: Acute post operative upper GI bleeding Post-procedure Diagnosis: Same  Post Mesenteric arteriogram and percutaneous coil embolization of the residual left gastric artery and the left phrenic artery.    Complications: None Immediate  EBL: None  Keep right leg straight for 4 hrs.    SignedSimonne Come: [place] Pager: [phone]: [phone] 02/12/2017, 5:13 PM

## 2017-02-12 NOTE — Consult Note (Signed)
Referring Provider: Dr. Harle StanfordSabia Primary Care Physician:  Patient, No Pcp Per Primary Gastroenterologist:  Gentry FitzUnassigned  Reason for Consultation:  GI bleed; Abdominal pain  HPI: Jeffrey Crane is a 56 y.o. male with acute onset of vomiting red blood yesterday with abdominal pain. He is s/p a gastrectomy last month in [place] that was reportedly done due to gastroparesis. Denies dizziness, melena or hematochezia. Hypotensive in the 90's systolic. Hgb 6.8. T 104.1 Reports doing well postop and eating ok after discharge there. Denies NSAIDs. Denies cocaine. Remote history of alcohol use.  Past Medical History:  Diagnosis Date  . Acid reflux   . COPD (chronic obstructive pulmonary disease) (HCC)   . GI bleeding   . Hypoglycemia   . 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 TIA (transient ischemic attack)    x3    Past Surgical History:  Procedure Laterality Date  . ABDOMINAL SURGERY    . BACK SURGERY    . CARPAL TUNNEL RELEASE    . CERVICAL FUSION    . GASTRECTOMY    . HERNIA REPAIR     Abdominal wall  . KNEE SURGERY    . SHOULDER SURGERY    . SMALL INTESTINE SURGERY    . ULNAR NERVE REPAIR      Prior to Admission medications   Medication Sig Start Date End Date Taking? Authorizing Provider  albuterol (ACCUNEB) 1.25 MG/3ML nebulizer solution Take 1 ampule by nebulization every 6 (six) hours as needed. For shortness of breath   Yes [provider]  albuterol (PROVENTIL HFA;VENTOLIN HFA) 108 (90 BASE) MCG/ACT inhaler Inhale 2 puffs into the lungs every 6 (six) hours as needed. For shortness of breath   Yes [provider]  cyclobenzaprine (FLEXERIL) 10 MG tablet Take 10 mg by mouth 3 (three) times daily as needed. For muscle spasm   Yes [provider]  fluticasone (FLONASE) 50 MCG/ACT nasal spray Place 2 sprays into the nose 2 (two) times daily.   Yes [provider]  gabapentin (NEURONTIN) 300 MG capsule  Take 300 mg by mouth daily.   Yes [provider]  HYDROcodone-acetaminophen (NORCO) 10-325 MG per tablet Take 1 tablet by mouth every 6 (six) hours as needed. For pain   Yes [provider]  LORazepam (ATIVAN) 0.5 MG tablet Take 0.5 mg by mouth every 8 (eight) hours. For anxiety   Yes [provider]  pantoprazole (PROTONIX) 20 MG tablet Take 20 mg by mouth daily.   Yes [provider]  promethazine (PHENERGAN) 25 MG tablet Take 25 mg by mouth every 6 (six) hours as needed. For nausea   Yes [provider]  Tiotropium Bromide Monohydrate (SPIRIVA RESPIMAT) 1.25 MCG/ACT AERS Inhale 2 puffs into the lungs daily.   Yes [provider]    Scheduled Meds: . metoCLOPramide (REGLAN) injection  5 mg Intravenous Q6H  . mupirocin ointment  1 application Nasal BID   Continuous Infusions: . sodium chloride 100 mL/hr at 02/12/17 0710  . sodium chloride    . pantoprozole (PROTONIX) infusion 8 mg/hr (02/11/17 2049)  . piperacillin-tazobactam    . piperacillin-tazobactam (ZOSYN)  IV     PRN Meds:.acetaminophen, fentaNYL (SUBLIMAZE) injection, ondansetron (ZOFRAN) IV  Allergies as of 02/11/2017 - Review Complete 02/11/2017  Allergen Reaction Noted  . Famciclovir Rash 06/23/2012  . Aspirin Other (See Comments) 11/23/2011  . Avelox [moxifloxacin hcl in nacl] Hives 11/23/2011  . Chlorhexidine gluconate Hives 11/23/2011    Family  History  Problem Relation Age of Onset  . COPD Mother   . Stroke Mother   . Colitis Mother        cdiff colitis  . Emphysema Father   . Diabetes Mellitus II Father   . Congestive Heart Failure Father   . Rheum arthritis Sister   . Diabetes Mellitus II Sister   . COPD Sister   . Hypertension Sister   . Diabetes Mellitus II Brother   . Neuropathy Brother   . Colon cancer Brother   . Diabetes Mellitus II Sister   . Hypertension Sister   . Psoriasis Sister   . Osteoporosis Sister   . Neuropathy Sister   .  Transient ischemic attack Sister     Social History   Social History  . Marital status: Married    Spouse name: N/A  . Number of children: N/A  . Years of education: N/A   Occupational History  . Not on file.   Social History Main Topics  . Smoking status: Former Smoker    Quit date: 11/22/2009  . Smokeless tobacco: Not on file  . Alcohol use No  . Drug use: No  . Sexual activity: Not on file   Other Topics Concern  . Not on file   Social History Narrative  . No narrative on file    Review of Systems: All negative except as stated above in HPI.  Physical Exam: Vital signs: Vitals:   02/12/17 0334 02/12/17 0801  BP: (!) 99/59 118/76  Pulse: 72   Resp: (!) 21 (!) 31  Temp: 98.9 F (37.2 C) (!) 104.1 F (40.1 C)   Last BM Date: 02/11/17 General:  +acute distress, thin, lethargic HEENT: anicteric sclera, oropharynx clear Lungs:  Coarse breath sounds Heart:  Regular rate and rhythm; no murmurs, clicks, rubs,  or gallops. Abdomen: diffuse guarding with rebound, hypoactive bowel sounds, nondistended, midline surgical dressing Rectal:  Deferred Ext: no edema  GI:  Lab Results:  Recent Labs  02/11/17 1950 02/11/17 2003 02/12/17 0646  WBC 10.7*  --  15.6*  HGB 6.8* 7.1* 8.6*  HCT 22.3* 21.0* 26.9*  PLT 539*  --  662*   BMET  Recent Labs  02/11/17 1950 02/11/17 2003 02/12/17 0646  NA 136 139 137  K 4.0 4.1 4.1  CL 108 108 111  CO2 19*  --  18*  GLUCOSE 139* 134* 128*  BUN 22* 23* 24*  CREATININE 1.01 0.90 0.80  CALCIUM 8.1*  --  7.9*   LFT  Recent Labs  02/11/17 1950  PROT 5.2*  ALBUMIN 2.2*  AST 11*  ALT 8*  ALKPHOS 67  BILITOT 0.3   PT/INR  Recent Labs  02/11/17 1950 02/12/17 0646  LABPROT 15.6* 15.9*  INR 1.23 1.26     Studies/Results: No results found.  Impression/Plan: Acute abdomen and s/p gastrectomy last month in [place] reportedly due to gastroparesis, which is not an indication for gastrectomy. His vomiting of blood  is also concerning for a perforation. Needs stat surgical consult and stat imaging. D/W Dr. Harle Stanford and surgery PA now here. No role for EGD at this time. Continue Protonix drip and broad spectrum antibiotics.    LOS: 1 day   Justn Quale C.  02/12/2017, 8:20 AM  Pager [phone]  AFTER 5 pm or on weekends please call [phone]

## 2017-02-12 NOTE — Progress Notes (Signed)
Pharmacy Antibiotic Note  Jeffrey Crane is a 56 y.o. male admitted on 02/11/2017 with intra-abdominal infection started on Zosyn and Eraxis.  He has splenic rupture and active bleeding requiring embolization this evening.  Pharmacy consulted to add vancomycin.  Labs and meds reviewed.   Plan: - Vanc 1500mg  IV Q24H for goal trough 10-15 mcg/mL - Monitor renal fxn, clinical progress, vanc trough as indicated   Height: 5\' 5"  (165.1 cm) Weight: 114 lb 13.8 oz (52.1 kg) IBW/kg (Calculated) : 61.5  Temp (24hrs), Avg:99 F (37.2 C), Min:97.3 F (36.3 C), Max:104.1 F (40.1 C)   Recent Labs Lab 02/11/17 1950 02/11/17 2003 02/12/17 0646 02/12/17 1029 02/12/17 1200  WBC 10.7*  --  15.6* 26.6*  --   CREATININE 1.01 0.90 0.80  --   --   LATICACIDVEN  --   --   --   --  1.5    Estimated Creatinine Clearance: 75.1 mL/min (by C-G formula based on SCr of 0.8 mg/dL).    Allergies  Allergen Reactions  . Famciclovir Rash  . Aspirin Other (See Comments)    Bleeding ulcer  . Avelox [Moxifloxacin Hcl In Nacl] Hives  . Chlorhexidine Gluconate Hives      Samuel Mcpeek D. Laney Potashang, PharmD, BCPS Pager:  [phone] 02/12/2017, 10:10 PM

## 2017-02-12 NOTE — Progress Notes (Signed)
Pharmacy Antibiotic Note  Ishmael HolterJames L Simms is a 56 y.o. male admitted on 02/11/2017 with possible intra-abdominal infection  Plan: Continue zosyn per MD Eraxis 200 mg x 1 then 100 mg q24h Monitor clinical progress/ cx if needed F/u gi surgery plans s/p CT  Height: 5\' 5"  (165.1 cm) Weight: 114 lb 13.8 oz (52.1 kg) IBW/kg (Calculated) : 61.5  Temp (24hrs), Avg:99 F (37.2 C), Min:97.9 F (36.6 C), Max:104.1 F (40.1 C)   Recent Labs Lab 02/11/17 1950 02/11/17 2003 02/12/17 0646  WBC 10.7*  --  15.6*  CREATININE 1.01 0.90 0.80    Estimated Creatinine Clearance: 75.1 mL/min (by C-G formula based on SCr of 0.8 mg/dL).    Allergies  Allergen Reactions  . Famciclovir Rash  . Aspirin Other (See Comments)    Bleeding ulcer  . Avelox [Moxifloxacin Hcl In Nacl] Hives  . Chlorhexidine Gluconate Hives   Isaac BlissMichael Clent Damore, PharmD, BCPS, BCCCP Clinical Pharmacist Clinical phone for 02/12/2017 from 7a-3:30p: [phone] If after 3:30p, please call main pharmacy at: [phone] 02/12/2017 10:53 AM

## 2017-02-13 ENCOUNTER — Inpatient Hospital Stay (HOSPITAL_COMMUNITY): Payer: Medicare Other

## 2017-02-13 ENCOUNTER — Encounter (HOSPITAL_COMMUNITY): Payer: Self-pay | Admitting: Interventional Radiology

## 2017-02-13 DIAGNOSIS — R1013 Epigastric pain: Secondary | ICD-10-CM

## 2017-02-13 DIAGNOSIS — R578 Other shock: Secondary | ICD-10-CM

## 2017-02-13 DIAGNOSIS — D7389 Other diseases of spleen: Secondary | ICD-10-CM

## 2017-02-13 DIAGNOSIS — E43 Unspecified severe protein-calorie malnutrition: Secondary | ICD-10-CM | POA: Insufficient documentation

## 2017-02-13 LAB — HEMOGLOBIN AND HEMATOCRIT, BLOOD
HCT: 27.2 % — ABNORMAL LOW (ref 39.0–52.0)
HEMATOCRIT: 28.7 % — AB (ref 39.0–52.0)
HEMATOCRIT: 26.4 % — AB (ref 39.0–52.0)
HEMATOCRIT: 24.1 % — AB (ref 39.0–52.0)
HEMOGLOBIN: 9.5 g/dL — AB (ref 13.0–17.0)
Hemoglobin: 8.9 g/dL — ABNORMAL LOW (ref 13.0–17.0)
Hemoglobin: 9 g/dL — ABNORMAL LOW (ref 13.0–17.0)
Hemoglobin: 8.1 g/dL — ABNORMAL LOW (ref 13.0–17.0)

## 2017-02-13 LAB — GLUCOSE, CAPILLARY
GLUCOSE-CAPILLARY: 144 mg/dL — AB (ref 65–99)
GLUCOSE-CAPILLARY: 121 mg/dL — AB (ref 65–99)
Glucose-Capillary: 115 mg/dL — ABNORMAL HIGH (ref 65–99)

## 2017-02-13 LAB — URINALYSIS, ROUTINE W REFLEX MICROSCOPIC
BILIRUBIN URINE: NEGATIVE
Glucose, UA: NEGATIVE mg/dL
Hgb urine dipstick: NEGATIVE
KETONES UR: NEGATIVE mg/dL
LEUKOCYTES UA: NEGATIVE
NITRITE: NEGATIVE
Protein, ur: NEGATIVE mg/dL
SPECIFIC GRAVITY, URINE: 1.043 — AB (ref 1.005–1.030)
pH: 5 (ref 5.0–8.0)

## 2017-02-13 LAB — PROTIME-INR
INR: 1.39
Prothrombin Time: 17.2 seconds — ABNORMAL HIGH (ref 11.4–15.2)

## 2017-02-13 MED ORDER — IOPAMIDOL (ISOVUE-300) INJECTION 61%
INTRAVENOUS | Status: AC
  Filled 2017-02-13: qty 150

## 2017-02-13 MED ORDER — FENTANYL CITRATE (PF) 100 MCG/2ML IJ SOLN
50.0000 ug | INTRAMUSCULAR | Status: DC | PRN
  Administered 2017-02-13 – 2017-02-14 (×16): 50 ug via INTRAVENOUS
  Filled 2017-02-13 (×15): qty 2

## 2017-02-13 MED ORDER — IOPAMIDOL (ISOVUE-300) INJECTION 61%
150.0000 mL | Freq: Once | INTRAVENOUS | Status: AC | PRN
  Administered 2017-02-13: 150 mL via ORAL

## 2017-02-13 MED ORDER — LABETALOL HCL 5 MG/ML IV SOLN: 10.0000 mg | INTRAVENOUS | Status: DC | PRN

## 2017-02-13 MED ORDER — IPRATROPIUM-ALBUTEROL 0.5-2.5 (3) MG/3ML IN SOLN
3.0000 mL | Freq: Three times a day (TID) | RESPIRATORY_TRACT | Status: DC
  Administered 2017-02-13 – 2017-02-15 (×6): 3 mL via RESPIRATORY_TRACT
  Filled 2017-02-13 (×6): qty 3

## 2017-02-13 NOTE — Progress Notes (Signed)
[facility] Progress Note     Subjective: CC: abdominal pain Patient states pain is mostly on the left side of his abdomen. Patient's wife and sister at bedside and gave more details about hx of this event as well as surgical hx. 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 State that he had a UGI study 2 days post-op with no leak. Patient was doing well post-operatively until Wednesday when he and his wife were driving in the car. Stated he felt a pop and immediate pain. Pulled over and got out of care and collapsed in pain, had the one large episode of hematemesis. Patient also tells me that while drinking oral contrast yesterday, he felt pain in the left side.  Wife also reports that patient has a hx of some esophagitis.  UOP good. BP up this AM. Tmax 101.4 overnight.  Objective: Vital signs in last 24 hours: Temp:  [97.3 F (36.3 C)-101.4 F (38.6 C)] 98.4 F (36.9 C) (07/06 0700) Pulse Rate:  [64-123] 89 (07/06 0800) Resp:  [0-40] 24 (07/06 0800) BP: (118-171)/(73-127) 149/92 (07/06 0800) SpO2:  [96 %-100 %] 100 % (07/06 0800) Last BM Date: 02/11/17  Intake/Output from previous day: 07/05 0701 - 07/06 0700 In: 4702.4 [I.V.:3410.4; Blood:692; IV UXYBFXOVA:919] Out: 1660 [Urine:1225] Intake/Output this shift: Total I/O In: 75 [I.V.:75] Out: 150 [Urine:150]  PE: Gen:  Alert, NAD, pleasant Card:  Regular rate and rhythm, no M/G/R appreciated Pulm:  Normal effort, clear to auscultation bilaterally Abd: Soft, TTP of left side, non-distended, bowel sounds present in all 4 quadrants. Midline incision is well healing. Skin: warm and dry, no rashes  Psych: A&Ox3   Lab Results:   Recent Labs  02/12/17 0646 02/12/17 1029 02/12/17 1901 02/13/17 0352  WBC 15.6* 26.6*  --   --   HGB 8.6* 5.7* 7.7* 9.5*  HCT 26.9* 18.0* 24.0* 28.7*  PLT 662* 563*  --   --    BMET  Recent Labs   02/11/17 1950 02/11/17 2003 02/12/17 0646  NA 136 139 137  K 4.0 4.1 4.1  CL 108 108 111  CO2 19*  --  18*  GLUCOSE 139* 134* 128*  BUN 22* 23* 24*  CREATININE 1.01 0.90 0.80  CALCIUM 8.1*  --  7.9*   PT/INR  Recent Labs  02/12/17 0646 02/13/17 0352  LABPROT 15.9* 17.2*  INR 1.26 1.39   CMP     Component Value Date/Time   NA 137 02/12/2017 0646   K 4.1 02/12/2017 0646   CL 111 02/12/2017 0646   CO2 18 (L) 02/12/2017 0646   GLUCOSE 128 (H) 02/12/2017 0646   BUN 24 (H) 02/12/2017 0646   CREATININE 0.80 02/12/2017 0646   CALCIUM 7.9 (L) 02/12/2017 0646   PROT 5.2 (L) 02/11/2017 1950   ALBUMIN 2.2 (L) 02/11/2017 1950   AST 11 (L) 02/11/2017 1950   ALT 8 (L) 02/11/2017 1950   ALKPHOS 67 02/11/2017 1950   BILITOT 0.3 02/11/2017 1950   GFRNONAA >60 02/12/2017 0646   GFRAA >60 02/12/2017 0646    Studies/Results: Ct Abdomen Pelvis W Contrast  Result Date: 02/12/2017 CLINICAL DATA:  Left-sided pain and hemoptysis since yesterday. Gastroparesis surgery 2 weeks ago. GI bleeding. EXAM: CT ABDOMEN AND PELVIS WITH CONTRAST TECHNIQUE: Multidetector CT imaging of the abdomen and pelvis was performed using the standard protocol following bolus administration of intravenous contrast. CONTRAST:  196m ISOVUE-300 IOPAMIDOL (ISOVUE-300) INJECTION 61% COMPARISON:  Plain films of earlier today.  No prior CT. FINDINGS: Lower chest: Minimal motion degradation at the lung bases. Subsegmental atelectasis at the left lung base. Normal heart size with right coronary artery atherosclerosis. Trace right and small left pleural effusion. Hepatobiliary: Normal liver. Cholecystectomy, without biliary ductal dilatation. Pancreas: Normal, without mass or ductal dilatation. Pancreas is displaced anteriorly by left upper quadrant blood. Spleen: Splenic rupture, as evidenced by perisplenic hemorrhage and areas of hypoattenuation throughout the spleen. Active extravasation of large volume left upper quadrant  hemorrhage, likely from the splenic artery. Example image 16/series 3 and on delayed images. Adrenals/Urinary Tract: Normal adrenal glands. Heterogeneous enhancement involves both kidneys, most apparent on delayed images. No hydronephrosis. Normal urinary bladder. Stomach/Bowel: Surgical changes about the proximal stomach. The stomach is difficult to delineate from the surrounding hemorrhage. Suspicion of direct extension of blood into the stomach, including on image 17/series 3. No gastric obstruction. Normal colon and terminal ileum. Normal small bowel caliber. Prior enterotomy. No free intraperitoneal air. Vascular/Lymphatic: Aortic and branch vessel atherosclerosis. No abdominopelvic adenopathy. Reproductive: Normal prostate. Other: Relatively small volume pelvic fluid with mild complexity, likely hemorrhage. Moderate volume intraperitoneal and retroperitoneal hemorrhage throughout the abdomen. Fluid containing periumbilical hernia. Musculoskeletal: Bilateral femoral head avascular necrosis. Posterior fixation at L4-5. IMPRESSION: 1. Splenic rupture with large volume active extravasation throughout the left upper quadrant, likely arising from distal splenic artery branches. 2. Apparent communication of hemorrhage into the stomach. 3. Bilateral heterogeneous renal enhancement may relate to pyelonephritis. 4. Tiny bilateral pleural effusions. 5. Bilateral femoral head avascular necrosis. Critical test results telephoned to.Dr. Nelda Marseille. at the time of interpretation at 2:48 p.m.on 02/12/2017. Electronically Signed   By: Abigail Miyamoto M.D.   On: 02/12/2017 14:57   Ir Angiogram Visceral Selective  Result Date: 02/13/2017 INDICATION: Recent history of near total gastrectomy for gastroparesis at institution in [place], now with markedly abnormal CT scan with active extravasation within left upper abdominal quadrant. Please perform mesenteric arteriogram and potential percutaneous coil embolization. EXAM: 1. ULTRASOUND  GUIDANCE FOR ARTERIAL ACCESS 2. SELECTIVE CELIAC ARTERIOGRAM 3. SELECTIVE LEFT GASTRIC REMNANT ARTERIOGRAM AND PERCUTANEOUS COIL EMBOLIZATION. 4. SELECTIVE LEFT INFERIOR PHRENIC ARTERIOGRAM AND PERCUTANEOUS COIL EMBOLIZATION. 5. SELECTIVE SPLENIC ARTERIOGRAM MEDICATIONS: None ANESTHESIA/SEDATION: Moderate (conscious) sedation was employed during this procedure. A total of Versed 5 mg and Fentanyl 100 mcg was administered intravenously. Moderate Sedation Time: 105 minutes. The patient's level of consciousness and vital signs were monitored continuously by radiology nursing throughout the procedure under my direct supervision. CONTRAST:  80 cc Isovue 300 FLUOROSCOPY TIME:  Fluoroscopy Time: 14 minutes 18 seconds (693 mGy). COMPLICATIONS: None immediate. PROCEDURE: Informed consent was obtained from the patient and the patient's family following explanation of the procedure, risks, benefits and alternatives. The patient understands, agrees and consents for the procedure. All questions were addressed. A time out was performed prior to the initiation of the procedure. Maximal barrier sterile technique utilized including caps, mask, sterile gowns, sterile gloves, large sterile drape, hand hygiene, and Betadine prep. The right femoral head was marked fluoroscopically. Under ultrasound guidance, the right common femoral artery was accessed with a micropuncture kit after the overlying soft tissues were anesthetized with 1% lidocaine. An ultrasound image was saved for documentation purposes. The micropuncture sheath was exchanged for a 5 [place] vascular sheath over a Bentson wire. A closure arteriogram was performed through the side of the sheath confirming access within the right common femoral artery. Over a Bentson wire, a Mickelson catheter was advanced to the level of  the thoracic aorta where it was back bled and flushed. The catheter was then utilized to select the celiac artery and a selective celiac arteriogram was  performed. Mickelson catheter was slowly retracted and utilized select the left gastric artery. Contrast injection confirmed the atretic appearance of this residual vessel. Next, a Fathom 14 micro wire was utilized to advance a regular Renegade microcatheter into the mid aspect of the remnant of this vessel. Contrast injection confirmed appropriate positioning. The residual left gastric artery was then subsequently embolized with multiple overlapping 3 and 2 mm interlock coils to near the level of its origin. Post embolization arteriogram images were obtained. Next, the [facility] catheter was utilized to select the left inferior phrenic artery. Again, the Fathom 14 micro wire was utilized to advance a regular Renegade microcatheter into the distal aspect of the left inferior phrenic artery, beyond the location of the pseudoaneurysm and active extravasation. The left inferior phrenic artery was then percutaneously coil embolized with multiple overlapping 3 and 4 mm diameter interlock coils across the area of pseudoaneurysm and active extravasation. Post embolization arteriogram images were obtained. Again, the Fathom 14 micro wire was utilized to advance a regular Renegade microcatheter into the distal aspect of the splenic artery. Several splenic arteriograms were performed both from the distal and mid aspects of the splenic artery. Images were reviewed and the procedure was terminated. All wires, catheters and sheaths were removed from the patient. Hemostasis was achieved at the right groin access site with deployment of an Exoseal closure device and manual compression. A dressing was placed. The patient tolerated the above procedure well without immediate postprocedural complication. FINDINGS: Initial celiac arteriogram demonstrates a conventional branching pattern, however note diffuse vasospasm was noted throughout the celiac arterial system secondary to patient's hypovolemic state. Contrast injection  demonstrates the origin of a residual, previously surgically ligated left gastric artery. Given concern for active extravasation from the gastric remnant as well as the diffuse vasospasm, this vessel was subsequently percutaneously coil embolized the level of the vessels origin. Post embolization arteriogram images demonstrate complete occlusion of this residual vessel. Contrast injection of the left inferior phrenic artery demonstrates active extravasation and pseudoaneurysm formation from its mid aspect, findings compatible with area of active extravasation demonstrated on preceding abdominal CT (coronal images 46 - 51, series 6). The inferior phrenic artery was percutaneously coil embolized across the level of pseudoaneurysm and active extravasation. Post embolization arteriogram images demonstrate complete occlusion of the left inferior phrenic artery without residual extravasation. Dedicated splenic arteriograms were negative for discrete area of vessel irregularity or contrast extravasation. As such, embolization of the splenic artery was not performed at this time. IMPRESSION: 1. Technically successful percutaneous coil embolization of the left inferior phrenic artery for active extravasation. 2. Technically successful prophylactic percutaneous coil embolization of the remnant left gastric artery. 3. No definite areas of active extravasation or vessel irregularity are identified arising from the splenic artery. As such, splenic embolization was not performed at this time. Above findings discussed with Dr. Janann Colonel (critical care) at the time of procedure completion. Electronically Signed   By: Sandi Mariscal M.D.   On: 02/13/2017 08:20   Ir Angiogram Selective Each Additional Vessel  Result Date: 02/13/2017 INDICATION: Recent history of near total gastrectomy for gastroparesis at institution in [place], now with markedly abnormal CT scan with active extravasation within left upper abdominal quadrant. Please  perform mesenteric arteriogram and potential percutaneous coil embolization. EXAM: 1. ULTRASOUND GUIDANCE FOR ARTERIAL ACCESS 2. SELECTIVE CELIAC ARTERIOGRAM 3.  SELECTIVE LEFT GASTRIC REMNANT ARTERIOGRAM AND PERCUTANEOUS COIL EMBOLIZATION. 4. SELECTIVE LEFT INFERIOR PHRENIC ARTERIOGRAM AND PERCUTANEOUS COIL EMBOLIZATION. 5. SELECTIVE SPLENIC ARTERIOGRAM MEDICATIONS: None ANESTHESIA/SEDATION: Moderate (conscious) sedation was employed during this procedure. A total of Versed 5 mg and Fentanyl 100 mcg was administered intravenously. Moderate Sedation Time: 105 minutes. The patient's level of consciousness and vital signs were monitored continuously by radiology nursing throughout the procedure under my direct supervision. CONTRAST:  80 cc Isovue 300 FLUOROSCOPY TIME:  Fluoroscopy Time: 14 minutes 18 seconds (693 mGy). COMPLICATIONS: None immediate. PROCEDURE: Informed consent was obtained from the patient and the patient's family following explanation of the procedure, risks, benefits and alternatives. The patient understands, agrees and consents for the procedure. All questions were addressed. A time out was performed prior to the initiation of the procedure. Maximal barrier sterile technique utilized including caps, mask, sterile gowns, sterile gloves, large sterile drape, hand hygiene, and Betadine prep. The right femoral head was marked fluoroscopically. Under ultrasound guidance, the right common femoral artery was accessed with a micropuncture kit after the overlying soft tissues were anesthetized with 1% lidocaine. An ultrasound image was saved for documentation purposes. The micropuncture sheath was exchanged for a 5 [place] vascular sheath over a Bentson wire. A closure arteriogram was performed through the side of the sheath confirming access within the right common femoral artery. Over a Bentson wire, a Mickelson catheter was advanced to the level of the thoracic aorta where it was back bled and flushed. The  catheter was then utilized to select the celiac artery and a selective celiac arteriogram was performed. Mickelson catheter was slowly retracted and utilized select the left gastric artery. Contrast injection confirmed the atretic appearance of this residual vessel. Next, a Fathom 14 micro wire was utilized to advance a regular Renegade microcatheter into the mid aspect of the remnant of this vessel. Contrast injection confirmed appropriate positioning. The residual left gastric artery was then subsequently embolized with multiple overlapping 3 and 2 mm interlock coils to near the level of its origin. Post embolization arteriogram images were obtained. Next, the [facility] catheter was utilized to select the left inferior phrenic artery. Again, the Fathom 14 micro wire was utilized to advance a regular Renegade microcatheter into the distal aspect of the left inferior phrenic artery, beyond the location of the pseudoaneurysm and active extravasation. The left inferior phrenic artery was then percutaneously coil embolized with multiple overlapping 3 and 4 mm diameter interlock coils across the area of pseudoaneurysm and active extravasation. Post embolization arteriogram images were obtained. Again, the Fathom 14 micro wire was utilized to advance a regular Renegade microcatheter into the distal aspect of the splenic artery. Several splenic arteriograms were performed both from the distal and mid aspects of the splenic artery. Images were reviewed and the procedure was terminated. All wires, catheters and sheaths were removed from the patient. Hemostasis was achieved at the right groin access site with deployment of an Exoseal closure device and manual compression. A dressing was placed. The patient tolerated the above procedure well without immediate postprocedural complication. FINDINGS: Initial celiac arteriogram demonstrates a conventional branching pattern, however note diffuse vasospasm was noted throughout the  celiac arterial system secondary to patient's hypovolemic state. Contrast injection demonstrates the origin of a residual, previously surgically ligated left gastric artery. Given concern for active extravasation from the gastric remnant as well as the diffuse vasospasm, this vessel was subsequently percutaneously coil embolized the level of the vessels origin. Post embolization arteriogram images demonstrate complete occlusion  of this residual vessel. Contrast injection of the left inferior phrenic artery demonstrates active extravasation and pseudoaneurysm formation from its mid aspect, findings compatible with area of active extravasation demonstrated on preceding abdominal CT (coronal images 46 - 51, series 6). The inferior phrenic artery was percutaneously coil embolized across the level of pseudoaneurysm and active extravasation. Post embolization arteriogram images demonstrate complete occlusion of the left inferior phrenic artery without residual extravasation. Dedicated splenic arteriograms were negative for discrete area of vessel irregularity or contrast extravasation. As such, embolization of the splenic artery was not performed at this time. IMPRESSION: 1. Technically successful percutaneous coil embolization of the left inferior phrenic artery for active extravasation. 2. Technically successful prophylactic percutaneous coil embolization of the remnant left gastric artery. 3. No definite areas of active extravasation or vessel irregularity are identified arising from the splenic artery. As such, splenic embolization was not performed at this time. Above findings discussed with Dr. Janann Colonel (critical care) at the time of procedure completion. Electronically Signed   By: Sandi Mariscal M.D.   On: 02/13/2017 08:20   Ir Angiogram Selective Each Additional Vessel  Result Date: 02/13/2017 INDICATION: Recent history of near total gastrectomy for gastroparesis at institution in [place], now with markedly abnormal  CT scan with active extravasation within left upper abdominal quadrant. Please perform mesenteric arteriogram and potential percutaneous coil embolization. EXAM: 1. ULTRASOUND GUIDANCE FOR ARTERIAL ACCESS 2. SELECTIVE CELIAC ARTERIOGRAM 3. SELECTIVE LEFT GASTRIC REMNANT ARTERIOGRAM AND PERCUTANEOUS COIL EMBOLIZATION. 4. SELECTIVE LEFT INFERIOR PHRENIC ARTERIOGRAM AND PERCUTANEOUS COIL EMBOLIZATION. 5. SELECTIVE SPLENIC ARTERIOGRAM MEDICATIONS: None ANESTHESIA/SEDATION: Moderate (conscious) sedation was employed during this procedure. A total of Versed 5 mg and Fentanyl 100 mcg was administered intravenously. Moderate Sedation Time: 105 minutes. The patient's level of consciousness and vital signs were monitored continuously by radiology nursing throughout the procedure under my direct supervision. CONTRAST:  80 cc Isovue 300 FLUOROSCOPY TIME:  Fluoroscopy Time: 14 minutes 18 seconds (693 mGy). COMPLICATIONS: None immediate. PROCEDURE: Informed consent was obtained from the patient and the patient's family following explanation of the procedure, risks, benefits and alternatives. The patient understands, agrees and consents for the procedure. All questions were addressed. A time out was performed prior to the initiation of the procedure. Maximal barrier sterile technique utilized including caps, mask, sterile gowns, sterile gloves, large sterile drape, hand hygiene, and Betadine prep. The right femoral head was marked fluoroscopically. Under ultrasound guidance, the right common femoral artery was accessed with a micropuncture kit after the overlying soft tissues were anesthetized with 1% lidocaine. An ultrasound image was saved for documentation purposes. The micropuncture sheath was exchanged for a 5 [place] vascular sheath over a Bentson wire. A closure arteriogram was performed through the side of the sheath confirming access within the right common femoral artery. Over a Bentson wire, a Mickelson catheter was  advanced to the level of the thoracic aorta where it was back bled and flushed. The catheter was then utilized to select the celiac artery and a selective celiac arteriogram was performed. Mickelson catheter was slowly retracted and utilized select the left gastric artery. Contrast injection confirmed the atretic appearance of this residual vessel. Next, a Fathom 14 micro wire was utilized to advance a regular Renegade microcatheter into the mid aspect of the remnant of this vessel. Contrast injection confirmed appropriate positioning. The residual left gastric artery was then subsequently embolized with multiple overlapping 3 and 2 mm interlock coils to near the level of its origin. Post embolization arteriogram images were obtained.  Next, the [facility] catheter was utilized to select the left inferior phrenic artery. Again, the Fathom 14 micro wire was utilized to advance a regular Renegade microcatheter into the distal aspect of the left inferior phrenic artery, beyond the location of the pseudoaneurysm and active extravasation. The left inferior phrenic artery was then percutaneously coil embolized with multiple overlapping 3 and 4 mm diameter interlock coils across the area of pseudoaneurysm and active extravasation. Post embolization arteriogram images were obtained. Again, the Fathom 14 micro wire was utilized to advance a regular Renegade microcatheter into the distal aspect of the splenic artery. Several splenic arteriograms were performed both from the distal and mid aspects of the splenic artery. Images were reviewed and the procedure was terminated. All wires, catheters and sheaths were removed from the patient. Hemostasis was achieved at the right groin access site with deployment of an Exoseal closure device and manual compression. A dressing was placed. The patient tolerated the above procedure well without immediate postprocedural complication. FINDINGS: Initial celiac arteriogram demonstrates a  conventional branching pattern, however note diffuse vasospasm was noted throughout the celiac arterial system secondary to patient's hypovolemic state. Contrast injection demonstrates the origin of a residual, previously surgically ligated left gastric artery. Given concern for active extravasation from the gastric remnant as well as the diffuse vasospasm, this vessel was subsequently percutaneously coil embolized the level of the vessels origin. Post embolization arteriogram images demonstrate complete occlusion of this residual vessel. Contrast injection of the left inferior phrenic artery demonstrates active extravasation and pseudoaneurysm formation from its mid aspect, findings compatible with area of active extravasation demonstrated on preceding abdominal CT (coronal images 46 - 51, series 6). The inferior phrenic artery was percutaneously coil embolized across the level of pseudoaneurysm and active extravasation. Post embolization arteriogram images demonstrate complete occlusion of the left inferior phrenic artery without residual extravasation. Dedicated splenic arteriograms were negative for discrete area of vessel irregularity or contrast extravasation. As such, embolization of the splenic artery was not performed at this time. IMPRESSION: 1. Technically successful percutaneous coil embolization of the left inferior phrenic artery for active extravasation. 2. Technically successful prophylactic percutaneous coil embolization of the remnant left gastric artery. 3. No definite areas of active extravasation or vessel irregularity are identified arising from the splenic artery. As such, splenic embolization was not performed at this time. Above findings discussed with Dr. Janann Colonel (critical care) at the time of procedure completion. Electronically Signed   By: Sandi Mariscal M.D.   On: 02/13/2017 08:20   Ir Angiogram Selective Each Additional Vessel  Result Date: 02/13/2017 INDICATION: Recent history of near  total gastrectomy for gastroparesis at institution in [place], now with markedly abnormal CT scan with active extravasation within left upper abdominal quadrant. Please perform mesenteric arteriogram and potential percutaneous coil embolization. EXAM: 1. ULTRASOUND GUIDANCE FOR ARTERIAL ACCESS 2. SELECTIVE CELIAC ARTERIOGRAM 3. SELECTIVE LEFT GASTRIC REMNANT ARTERIOGRAM AND PERCUTANEOUS COIL EMBOLIZATION. 4. SELECTIVE LEFT INFERIOR PHRENIC ARTERIOGRAM AND PERCUTANEOUS COIL EMBOLIZATION. 5. SELECTIVE SPLENIC ARTERIOGRAM MEDICATIONS: None ANESTHESIA/SEDATION: Moderate (conscious) sedation was employed during this procedure. A total of Versed 5 mg and Fentanyl 100 mcg was administered intravenously. Moderate Sedation Time: 105 minutes. The patient's level of consciousness and vital signs were monitored continuously by radiology nursing throughout the procedure under my direct supervision. CONTRAST:  80 cc Isovue 300 FLUOROSCOPY TIME:  Fluoroscopy Time: 14 minutes 18 seconds (693 mGy). COMPLICATIONS: None immediate. PROCEDURE: Informed consent was obtained from the patient and the patient's family following explanation of the procedure,  risks, benefits and alternatives. The patient understands, agrees and consents for the procedure. All questions were addressed. A time out was performed prior to the initiation of the procedure. Maximal barrier sterile technique utilized including caps, mask, sterile gowns, sterile gloves, large sterile drape, hand hygiene, and Betadine prep. The right femoral head was marked fluoroscopically. Under ultrasound guidance, the right common femoral artery was accessed with a micropuncture kit after the overlying soft tissues were anesthetized with 1% lidocaine. An ultrasound image was saved for documentation purposes. The micropuncture sheath was exchanged for a 5 [place] vascular sheath over a Bentson wire. A closure arteriogram was performed through the side of the sheath confirming access  within the right common femoral artery. Over a Bentson wire, a Mickelson catheter was advanced to the level of the thoracic aorta where it was back bled and flushed. The catheter was then utilized to select the celiac artery and a selective celiac arteriogram was performed. Mickelson catheter was slowly retracted and utilized select the left gastric artery. Contrast injection confirmed the atretic appearance of this residual vessel. Next, a Fathom 14 micro wire was utilized to advance a regular Renegade microcatheter into the mid aspect of the remnant of this vessel. Contrast injection confirmed appropriate positioning. The residual left gastric artery was then subsequently embolized with multiple overlapping 3 and 2 mm interlock coils to near the level of its origin. Post embolization arteriogram images were obtained. Next, the [facility] catheter was utilized to select the left inferior phrenic artery. Again, the Fathom 14 micro wire was utilized to advance a regular Renegade microcatheter into the distal aspect of the left inferior phrenic artery, beyond the location of the pseudoaneurysm and active extravasation. The left inferior phrenic artery was then percutaneously coil embolized with multiple overlapping 3 and 4 mm diameter interlock coils across the area of pseudoaneurysm and active extravasation. Post embolization arteriogram images were obtained. Again, the Fathom 14 micro wire was utilized to advance a regular Renegade microcatheter into the distal aspect of the splenic artery. Several splenic arteriograms were performed both from the distal and mid aspects of the splenic artery. Images were reviewed and the procedure was terminated. All wires, catheters and sheaths were removed from the patient. Hemostasis was achieved at the right groin access site with deployment of an Exoseal closure device and manual compression. A dressing was placed. The patient tolerated the above procedure well without immediate  postprocedural complication. FINDINGS: Initial celiac arteriogram demonstrates a conventional branching pattern, however note diffuse vasospasm was noted throughout the celiac arterial system secondary to patient's hypovolemic state. Contrast injection demonstrates the origin of a residual, previously surgically ligated left gastric artery. Given concern for active extravasation from the gastric remnant as well as the diffuse vasospasm, this vessel was subsequently percutaneously coil embolized the level of the vessels origin. Post embolization arteriogram images demonstrate complete occlusion of this residual vessel. Contrast injection of the left inferior phrenic artery demonstrates active extravasation and pseudoaneurysm formation from its mid aspect, findings compatible with area of active extravasation demonstrated on preceding abdominal CT (coronal images 46 - 51, series 6). The inferior phrenic artery was percutaneously coil embolized across the level of pseudoaneurysm and active extravasation. Post embolization arteriogram images demonstrate complete occlusion of the left inferior phrenic artery without residual extravasation. Dedicated splenic arteriograms were negative for discrete area of vessel irregularity or contrast extravasation. As such, embolization of the splenic artery was not performed at this time. IMPRESSION: 1. Technically successful percutaneous coil embolization of the left  inferior phrenic artery for active extravasation. 2. Technically successful prophylactic percutaneous coil embolization of the remnant left gastric artery. 3. No definite areas of active extravasation or vessel irregularity are identified arising from the splenic artery. As such, splenic embolization was not performed at this time. Above findings discussed with Dr. Janann Colonel (critical care) at the time of procedure completion. Electronically Signed   By: Sandi Mariscal M.D.   On: 02/13/2017 08:20   Ir US Guide Vasc Access  Right  Result Date: 02/13/2017 INDICATION: Recent history of near total gastrectomy for gastroparesis at institution in [place], now with markedly abnormal CT scan with active extravasation within left upper abdominal quadrant. Please perform mesenteric arteriogram and potential percutaneous coil embolization. EXAM: 1. ULTRASOUND GUIDANCE FOR ARTERIAL ACCESS 2. SELECTIVE CELIAC ARTERIOGRAM 3. SELECTIVE LEFT GASTRIC REMNANT ARTERIOGRAM AND PERCUTANEOUS COIL EMBOLIZATION. 4. SELECTIVE LEFT INFERIOR PHRENIC ARTERIOGRAM AND PERCUTANEOUS COIL EMBOLIZATION. 5. SELECTIVE SPLENIC ARTERIOGRAM MEDICATIONS: None ANESTHESIA/SEDATION: Moderate (conscious) sedation was employed during this procedure. A total of Versed 5 mg and Fentanyl 100 mcg was administered intravenously. Moderate Sedation Time: 105 minutes. The patient's level of consciousness and vital signs were monitored continuously by radiology nursing throughout the procedure under my direct supervision. CONTRAST:  80 cc Isovue 300 FLUOROSCOPY TIME:  Fluoroscopy Time: 14 minutes 18 seconds (693 mGy). COMPLICATIONS: None immediate. PROCEDURE: Informed consent was obtained from the patient and the patient's family following explanation of the procedure, risks, benefits and alternatives. The patient understands, agrees and consents for the procedure. All questions were addressed. A time out was performed prior to the initiation of the procedure. Maximal barrier sterile technique utilized including caps, mask, sterile gowns, sterile gloves, large sterile drape, hand hygiene, and Betadine prep. The right femoral head was marked fluoroscopically. Under ultrasound guidance, the right common femoral artery was accessed with a micropuncture kit after the overlying soft tissues were anesthetized with 1% lidocaine. An ultrasound image was saved for documentation purposes. The micropuncture sheath was exchanged for a 5 [place] vascular sheath over a Bentson wire. A closure arteriogram  was performed through the side of the sheath confirming access within the right common femoral artery. Over a Bentson wire, a Mickelson catheter was advanced to the level of the thoracic aorta where it was back bled and flushed. The catheter was then utilized to select the celiac artery and a selective celiac arteriogram was performed. Mickelson catheter was slowly retracted and utilized select the left gastric artery. Contrast injection confirmed the atretic appearance of this residual vessel. Next, a Fathom 14 micro wire was utilized to advance a regular Renegade microcatheter into the mid aspect of the remnant of this vessel. Contrast injection confirmed appropriate positioning. The residual left gastric artery was then subsequently embolized with multiple overlapping 3 and 2 mm interlock coils to near the level of its origin. Post embolization arteriogram images were obtained. Next, the [facility] catheter was utilized to select the left inferior phrenic artery. Again, the Fathom 14 micro wire was utilized to advance a regular Renegade microcatheter into the distal aspect of the left inferior phrenic artery, beyond the location of the pseudoaneurysm and active extravasation. The left inferior phrenic artery was then percutaneously coil embolized with multiple overlapping 3 and 4 mm diameter interlock coils across the area of pseudoaneurysm and active extravasation. Post embolization arteriogram images were obtained. Again, the Fathom 14 micro wire was utilized to advance a regular Renegade microcatheter into the distal aspect of the splenic artery. Several splenic arteriograms were performed both from the  distal and mid aspects of the splenic artery. Images were reviewed and the procedure was terminated. All wires, catheters and sheaths were removed from the patient. Hemostasis was achieved at the right groin access site with deployment of an Exoseal closure device and manual compression. A dressing was placed.  The patient tolerated the above procedure well without immediate postprocedural complication. FINDINGS: Initial celiac arteriogram demonstrates a conventional branching pattern, however note diffuse vasospasm was noted throughout the celiac arterial system secondary to patient's hypovolemic state. Contrast injection demonstrates the origin of a residual, previously surgically ligated left gastric artery. Given concern for active extravasation from the gastric remnant as well as the diffuse vasospasm, this vessel was subsequently percutaneously coil embolized the level of the vessels origin. Post embolization arteriogram images demonstrate complete occlusion of this residual vessel. Contrast injection of the left inferior phrenic artery demonstrates active extravasation and pseudoaneurysm formation from its mid aspect, findings compatible with area of active extravasation demonstrated on preceding abdominal CT (coronal images 46 - 51, series 6). The inferior phrenic artery was percutaneously coil embolized across the level of pseudoaneurysm and active extravasation. Post embolization arteriogram images demonstrate complete occlusion of the left inferior phrenic artery without residual extravasation. Dedicated splenic arteriograms were negative for discrete area of vessel irregularity or contrast extravasation. As such, embolization of the splenic artery was not performed at this time. IMPRESSION: 1. Technically successful percutaneous coil embolization of the left inferior phrenic artery for active extravasation. 2. Technically successful prophylactic percutaneous coil embolization of the remnant left gastric artery. 3. No definite areas of active extravasation or vessel irregularity are identified arising from the splenic artery. As such, splenic embolization was not performed at this time. Above findings discussed with Dr. Janann Colonel (critical care) at the time of procedure completion. Electronically Signed   By: Sandi Mariscal M.D.   On: 02/13/2017 08:20   Dg Abd Portable 1v  Result Date: 02/12/2017 CLINICAL DATA:  ABDOMINAL PAIN,HX GASTRECTOMY IN [place] 6-18 TO 6-30.hematemesis. History prior GI bleeding FLAT PLATE ABDOMEN PER DR CORNETT.PT FOR CT SCAN TODAY EXAM: PORTABLE ABDOMEN - 1 VIEW COMPARISON:  Chest x-ray 11/23/2011 FINDINGS: There is dilatation of numerous small bowel loops in the central abdomen. Surgical clips are identified in the right upper quadrant. No evidence for free intraperitoneal air on this supine views provided. Status post lumbar fusion. IMPRESSION: Findings consistent with partial or early small bowel obstruction. Electronically Signed   By: Nolon Nations M.D.   On: 02/12/2017 09:32   [place] Guide Roadmapping  Result Date: 02/13/2017 INDICATION: Recent history of near total gastrectomy for gastroparesis at institution in [place], now with markedly abnormal CT scan with active extravasation within left upper abdominal quadrant. Please perform mesenteric arteriogram and potential percutaneous coil embolization. EXAM: 1. ULTRASOUND GUIDANCE FOR ARTERIAL ACCESS 2. SELECTIVE CELIAC ARTERIOGRAM 3. SELECTIVE LEFT GASTRIC REMNANT ARTERIOGRAM AND PERCUTANEOUS COIL EMBOLIZATION. 4. SELECTIVE LEFT INFERIOR PHRENIC ARTERIOGRAM AND PERCUTANEOUS COIL EMBOLIZATION. 5. SELECTIVE SPLENIC ARTERIOGRAM MEDICATIONS: None ANESTHESIA/SEDATION: Moderate (conscious) sedation was employed during this procedure. A total of Versed 5 mg and Fentanyl 100 mcg was administered intravenously. Moderate Sedation Time: 105 minutes. The patient's level of consciousness and vital signs were monitored continuously by radiology nursing throughout the procedure under my direct supervision. CONTRAST:  80 cc Isovue 300 FLUOROSCOPY TIME:  Fluoroscopy Time: 14 minutes 18 seconds (693 mGy). COMPLICATIONS: None immediate. PROCEDURE: Informed consent was obtained from the patient and the patient's family following  explanation of the procedure, risks, benefits and alternatives. The patient understands, agrees and consents for the procedure. All questions were addressed. A time out was performed prior to the initiation of the procedure. Maximal barrier sterile technique utilized including caps, mask, sterile gowns, sterile gloves, large sterile drape, hand hygiene, and Betadine prep. The right femoral head was marked fluoroscopically. Under ultrasound guidance, the right common femoral artery was accessed with a micropuncture kit after the overlying soft tissues were anesthetized with 1% lidocaine. An ultrasound image was saved for documentation purposes. The micropuncture sheath was exchanged for a 5 [place] vascular sheath over a Bentson wire. A closure arteriogram was performed through the side of the sheath confirming access within the right common femoral artery. Over a Bentson wire, a Mickelson catheter was advanced to the level of the thoracic aorta where it was back bled and flushed. The catheter was then utilized to select the celiac artery and a selective celiac arteriogram was performed. Mickelson catheter was slowly retracted and utilized select the left gastric artery. Contrast injection confirmed the atretic appearance of this residual vessel. Next, a Fathom 14 micro wire was utilized to advance a regular Renegade microcatheter into the mid aspect of the remnant of this vessel. Contrast injection confirmed appropriate positioning. The residual left gastric artery was then subsequently embolized with multiple overlapping 3 and 2 mm interlock coils to near the level of its origin. Post embolization arteriogram images were obtained. Next, the [facility] catheter was utilized to select the left inferior phrenic artery. Again, the Fathom 14 micro wire was utilized to advance a regular Renegade microcatheter into the distal aspect of the left inferior phrenic artery, beyond the location of the pseudoaneurysm and active  extravasation. The left inferior phrenic artery was then percutaneously coil embolized with multiple overlapping 3 and 4 mm diameter interlock coils across the area of pseudoaneurysm and active extravasation. Post embolization arteriogram images were obtained. Again, the Fathom 14 micro wire was utilized to advance a regular Renegade microcatheter into the distal aspect of the splenic artery. Several splenic arteriograms were performed both from the distal and mid aspects of the splenic artery. Images were reviewed and the procedure was terminated. All wires, catheters and sheaths were removed from the patient. Hemostasis was achieved at the right groin access site with deployment of an Exoseal closure device and manual compression. A dressing was placed. The patient tolerated the above procedure well without immediate postprocedural complication. FINDINGS: Initial celiac arteriogram demonstrates a conventional branching pattern, however note diffuse vasospasm was noted throughout the celiac arterial system secondary to patient's hypovolemic state. Contrast injection demonstrates the origin of a residual, previously surgically ligated left gastric artery. Given concern for active extravasation from the gastric remnant as well as the diffuse vasospasm, this vessel was subsequently percutaneously coil embolized the level of the vessels origin. Post embolization arteriogram images demonstrate complete occlusion of this residual vessel. Contrast injection of the left inferior phrenic artery demonstrates active extravasation and pseudoaneurysm formation from its mid aspect, findings compatible with area of active extravasation demonstrated on preceding abdominal CT (coronal images 46 - 51, series 6). The inferior phrenic artery was percutaneously coil embolized across the level of pseudoaneurysm and active extravasation. Post embolization arteriogram images demonstrate complete occlusion of the left inferior phrenic  artery without residual extravasation. Dedicated splenic arteriograms were negative for discrete area of vessel irregularity or contrast extravasation. As such, embolization of the splenic artery was not performed at this time. IMPRESSION: 1. Technically successful percutaneous coil  embolization of the left inferior phrenic artery for active extravasation. 2. Technically successful prophylactic percutaneous coil embolization of the remnant left gastric artery. 3. No definite areas of active extravasation or vessel irregularity are identified arising from the splenic artery. As such, splenic embolization was not performed at this time. Above findings discussed with Dr. Janann Colonel (critical care) at the time of procedure completion. Electronically Signed   By: Sandi Mariscal M.D.   On: 02/13/2017 08:20    Anti-infectives: Anti-infectives    Start     Dose/Rate Route Frequency Ordered Stop   02/13/17 1200  anidulafungin (ERAXIS) 100 mg in sodium chloride 0.9 % 100 mL IVPB     100 mg 78 mL/hr over 100 Minutes Intravenous Every 24 hours 02/12/17 1053     02/12/17 2300  vancomycin (VANCOCIN) 1,500 mg in sodium chloride 0.9 % 500 mL IVPB     1,500 mg 250 mL/hr over 120 Minutes Intravenous Every 24 hours 02/12/17 2214     02/12/17 1400  piperacillin-tazobactam (ZOSYN) IVPB 3.375 g     3.375 g 12.5 mL/hr over 240 Minutes Intravenous Every 8 hours 02/12/17 0805     02/12/17 1200  anidulafungin (ERAXIS) 200 mg in sodium chloride 0.9 % 200 mL IVPB     200 mg 78 mL/hr over 200 Minutes Intravenous  Once 02/12/17 1053 02/12/17 1542   02/12/17 0815  piperacillin-tazobactam (ZOSYN) IVPB 3.375 g     3.375 g 100 mL/hr over 30 Minutes Intravenous  Once 02/12/17 0803 02/12/17 0925       Assessment/Plan 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 Coil embolization of left inferior phrenic artery and remnant of left gastric artery - UGI ordered  Anemia - transfused 5 units PRBC yesterday and overnight  - H/H 9.5/28.7 this AM, continue to monitor  FEN - NPO/IVF, IV protonix VTE - SCDs, no lovenox due to active bleeding ID - zosyn (7/5>>), vanco (7/5>>), anidulafungin (7/5>>)  Plan: UGI pending. Keep NPO/IVF. Continue IV protonix. Monitor H/H. Continue supportive management  LOS: 2 days    Brigid Re , [facility] 02/13/2017, 8:36 AM Pager: [phone] Consults: [phone] Mon-Fri 7:00 am-4:30 pm Sat-Sun 7:00 am-11:30 am

## 2017-02-13 NOTE — Progress Notes (Signed)
eLink Physician-Brief Progress Note Patient Name: Jeffrey HolterJames [name] DOB: [birth year] MRN: [id number]   Date of Service  02/13/2017  HPI/Events of Note  HTn  labetolol prn addedd  eICU Interventions       Intervention Category Intermediate Interventions: Hypertension - evaluation and management  Nelda BucksFEINSTEIN,Symphony Demuro J. 02/13/2017, 12:26 AM

## 2017-02-13 NOTE — Progress Notes (Signed)
Patient ID: Jeffrey Crane, male   DOB: January 16, 1961, 56 y.o.   MRN: [id number]  CT findings noted. Recent events reviewed in chart. No role for EGD at this time. Will sign off. Call us back if questions. Dr. Madilyn FiremanHayes on call this weekend.

## 2017-02-13 NOTE — Progress Notes (Signed)
Patient ID: Jeffrey Crane, male   DOB: 12/29/1960, 56 y.o.   MRN: [id number]    Referring Physician(s): Dr. Marshell Garfinkel  Supervising Physician: Corrie Mckusick  Patient Status: [facility] - In-pt  Chief Complaint: GI bleed  Subjective: Patient sitting up in bed and has some abdominal pain, but feels much better than yesterday.  No further bleeding  Allergies: Famciclovir; Aspirin; Avelox [moxifloxacin hcl in nacl]; and Chlorhexidine gluconate  Medications: Prior to Admission medications   Medication Sig Start Date End Date Taking? Authorizing Provider  albuterol (ACCUNEB) 1.25 MG/3ML nebulizer solution Take 1 ampule by nebulization every 6 (six) hours as needed. For shortness of breath   Yes [provider]  albuterol (PROVENTIL HFA;VENTOLIN HFA) 108 (90 BASE) MCG/ACT inhaler Inhale 2 puffs into the lungs every 6 (six) hours as needed. For shortness of breath   Yes [provider]  cyclobenzaprine (FLEXERIL) 10 MG tablet Take 10 mg by mouth 3 (three) times daily as needed. For muscle spasm   Yes [provider]  fluticasone (FLONASE) 50 MCG/ACT nasal spray Place 2 sprays into the nose 2 (two) times daily.   Yes [provider]  gabapentin (NEURONTIN) 300 MG capsule Take 300 mg by mouth daily.   Yes [provider]  HYDROcodone-acetaminophen (NORCO) 10-325 MG per tablet Take 1 tablet by mouth every 6 (six) hours as needed. For pain   Yes [provider]  LORazepam (ATIVAN) 0.5 MG tablet Take 0.5 mg by mouth every 8 (eight) hours. For anxiety   Yes [provider]  pantoprazole (PROTONIX) 20 MG tablet Take 20 mg by mouth daily.   Yes [provider]  promethazine (PHENERGAN) 25 MG tablet Take 25 mg by mouth every 6 (six) hours as needed. For nausea   Yes [provider]  Tiotropium Bromide Monohydrate (SPIRIVA RESPIMAT) 1.25 MCG/ACT AERS Inhale 2 puffs into the lungs daily.   Yes [provider]     Vital Signs: BP (!) 150/85   Pulse (!) 101   Temp 97.8 F (36.6 C) (Oral)   Resp (!) 34   Ht _0  (1.651 m)   Wt 114 lb 13.8 oz (52.1 kg)   SpO2 99%   BMI 19.11 kg/m   Physical Exam: Abd: soft, tender in LUQ, R CFA stick site is c/d/i with no evidence of bleeding  Imaging: Ct Abdomen Pelvis W Contrast  Addendum Date: 02/13/2017   ADDENDUM REPORT: 02/13/2017 09:36 ADDENDUM: Subsequent to the digital angiogram and discussion with Dr. Pascal Lux of interventional radiology, CT findings are felt to be related to rupture and acute extravasation from the left inferior phrenic artery. The appearance of the spleen is likely related to mass effect and hypotension, rather than splenic rupture. The pseudoaneurysm formation within the left inferior phrenic artery may be secondary to gastric surgical dehiscence with extravasation of gastric contents into the left upper quadrant. Electronically Signed   By: Abigail Miyamoto M.D.   On: 02/13/2017 09:36   Result Date: 02/13/2017 CLINICAL DATA:  Left-sided pain and hemoptysis since yesterday. Gastroparesis surgery 2 weeks ago. GI bleeding. EXAM: CT ABDOMEN AND PELVIS WITH CONTRAST TECHNIQUE: Multidetector CT imaging of the abdomen and pelvis was performed using the standard protocol following bolus administration of intravenous contrast. CONTRAST:  160m ISOVUE-300 IOPAMIDOL (ISOVUE-300) INJECTION 61% COMPARISON:  Plain films of earlier today.  No prior CT. FINDINGS: Lower chest: Minimal motion degradation at the lung bases. Subsegmental atelectasis at the left lung base. Normal heart size with right  coronary artery atherosclerosis. Trace right and small left pleural effusion. Hepatobiliary: Normal liver. Cholecystectomy, without biliary ductal dilatation. Pancreas: Normal, without mass or ductal dilatation. Pancreas is displaced anteriorly by left upper quadrant blood. Spleen: Splenic rupture, as evidenced by perisplenic hemorrhage and areas of hypoattenuation  throughout the spleen. Active extravasation of large volume left upper quadrant hemorrhage, likely from the splenic artery. Example image 16/series 3 and on delayed images. Adrenals/Urinary Tract: Normal adrenal glands. Heterogeneous enhancement involves both kidneys, most apparent on delayed images. No hydronephrosis. Normal urinary bladder. Stomach/Bowel: Surgical changes about the proximal stomach. The stomach is difficult to delineate from the surrounding hemorrhage. Suspicion of direct extension of blood into the stomach, including on image 17/series 3. No gastric obstruction. Normal colon and terminal ileum. Normal small bowel caliber. Prior enterotomy. No free intraperitoneal air. Vascular/Lymphatic: Aortic and branch vessel atherosclerosis. No abdominopelvic adenopathy. Reproductive: Normal prostate. Other: Relatively small volume pelvic fluid with mild complexity, likely hemorrhage. Moderate volume intraperitoneal and retroperitoneal hemorrhage throughout the abdomen. Fluid containing periumbilical hernia. Musculoskeletal: Bilateral femoral head avascular necrosis. Posterior fixation at L4-5. IMPRESSION: 1. Splenic rupture with large volume active extravasation throughout the left upper quadrant, likely arising from distal splenic artery branches. 2. Apparent communication of hemorrhage into the stomach. 3. Bilateral heterogeneous renal enhancement may relate to pyelonephritis. 4. Tiny bilateral pleural effusions. 5. Bilateral femoral head avascular necrosis. Critical test results telephoned to.Dr. Nelda Marseille. at the time of interpretation at 2:48 p.m.on 02/12/2017. Electronically Signed: By: Abigail Miyamoto M.D. On: 02/12/2017 14:57   Ir Angiogram Visceral Selective  Result Date: 02/13/2017 INDICATION: Recent history of near total gastrectomy for gastroparesis at institution in [place], now with markedly abnormal CT scan with active extravasation within left upper abdominal quadrant. Please perform mesenteric  arteriogram and potential percutaneous coil embolization. EXAM: 1. ULTRASOUND GUIDANCE FOR ARTERIAL ACCESS 2. SELECTIVE CELIAC ARTERIOGRAM 3. SELECTIVE LEFT GASTRIC REMNANT ARTERIOGRAM AND PERCUTANEOUS COIL EMBOLIZATION. 4. SELECTIVE LEFT INFERIOR PHRENIC ARTERIOGRAM AND PERCUTANEOUS COIL EMBOLIZATION. 5. SELECTIVE SPLENIC ARTERIOGRAM MEDICATIONS: None ANESTHESIA/SEDATION: Moderate (conscious) sedation was employed during this procedure. A total of Versed 5 mg and Fentanyl 100 mcg was administered intravenously. Moderate Sedation Time: 105 minutes. The patient's level of consciousness and vital signs were monitored continuously by radiology nursing throughout the procedure under my direct supervision. CONTRAST:  80 cc Isovue 300 FLUOROSCOPY TIME:  Fluoroscopy Time: 14 minutes 18 seconds (693 mGy). COMPLICATIONS: None immediate. PROCEDURE: Informed consent was obtained from the patient and the patient's family following explanation of the procedure, risks, benefits and alternatives. The patient understands, agrees and consents for the procedure. All questions were addressed. A time out was performed prior to the initiation of the procedure. Maximal barrier sterile technique utilized including caps, mask, sterile gowns, sterile gloves, large sterile drape, hand hygiene, and Betadine prep. The right femoral head was marked fluoroscopically. Under ultrasound guidance, the right common femoral artery was accessed with a micropuncture kit after the overlying soft tissues were anesthetized with 1% lidocaine. An ultrasound image was saved for documentation purposes. The micropuncture sheath was exchanged for a 5 [place] vascular sheath over a Bentson wire. A closure arteriogram was performed through the side of the sheath confirming access within the right common femoral artery. Over a Bentson wire, a Mickelson catheter was advanced to the level of the thoracic aorta where it was back bled and flushed. The catheter was then  utilized to select the celiac artery and a selective celiac arteriogram was performed. Mickelson catheter was slowly retracted and utilized  select the left gastric artery. Contrast injection confirmed the atretic appearance of this residual vessel. Next, a Fathom 14 micro wire was utilized to advance a regular Renegade microcatheter into the mid aspect of the remnant of this vessel. Contrast injection confirmed appropriate positioning. The residual left gastric artery was then subsequently embolized with multiple overlapping 3 and 2 mm interlock coils to near the level of its origin. Post embolization arteriogram images were obtained. Next, the [facility] catheter was utilized to select the left inferior phrenic artery. Again, the Fathom 14 micro wire was utilized to advance a regular Renegade microcatheter into the distal aspect of the left inferior phrenic artery, beyond the location of the pseudoaneurysm and active extravasation. The left inferior phrenic artery was then percutaneously coil embolized with multiple overlapping 3 and 4 mm diameter interlock coils across the area of pseudoaneurysm and active extravasation. Post embolization arteriogram images were obtained. Again, the Fathom 14 micro wire was utilized to advance a regular Renegade microcatheter into the distal aspect of the splenic artery. Several splenic arteriograms were performed both from the distal and mid aspects of the splenic artery. Images were reviewed and the procedure was terminated. All wires, catheters and sheaths were removed from the patient. Hemostasis was achieved at the right groin access site with deployment of an Exoseal closure device and manual compression. A dressing was placed. The patient tolerated the above procedure well without immediate postprocedural complication. FINDINGS: Initial celiac arteriogram demonstrates a conventional branching pattern, however note diffuse vasospasm was noted throughout the celiac arterial  system secondary to patient's hypovolemic state. Contrast injection demonstrates the origin of a residual, previously surgically ligated left gastric artery. Given concern for active extravasation from the gastric remnant as well as the diffuse vasospasm, this vessel was subsequently percutaneously coil embolized the level of the vessels origin. Post embolization arteriogram images demonstrate complete occlusion of this residual vessel. Contrast injection of the left inferior phrenic artery demonstrates active extravasation and pseudoaneurysm formation from its mid aspect, findings compatible with area of active extravasation demonstrated on preceding abdominal CT (coronal images 46 - 51, series 6). The inferior phrenic artery was percutaneously coil embolized across the level of pseudoaneurysm and active extravasation. Post embolization arteriogram images demonstrate complete occlusion of the left inferior phrenic artery without residual extravasation. Dedicated splenic arteriograms were negative for discrete area of vessel irregularity or contrast extravasation. As such, embolization of the splenic artery was not performed at this time. IMPRESSION: 1. Technically successful percutaneous coil embolization of the left inferior phrenic artery for active extravasation. 2. Technically successful prophylactic percutaneous coil embolization of the remnant left gastric artery. 3. No definite areas of active extravasation or vessel irregularity are identified arising from the splenic artery. As such, splenic embolization was not performed at this time. Above findings discussed with Dr. Janann Colonel (critical care) at the time of procedure completion. Electronically Signed   By: Sandi Mariscal M.D.   On: 02/13/2017 08:20   Ir Angiogram Selective Each Additional Vessel  Result Date: 02/13/2017 INDICATION: Recent history of near total gastrectomy for gastroparesis at institution in [place], now with markedly abnormal CT scan with  active extravasation within left upper abdominal quadrant. Please perform mesenteric arteriogram and potential percutaneous coil embolization. EXAM: 1. ULTRASOUND GUIDANCE FOR ARTERIAL ACCESS 2. SELECTIVE CELIAC ARTERIOGRAM 3. SELECTIVE LEFT GASTRIC REMNANT ARTERIOGRAM AND PERCUTANEOUS COIL EMBOLIZATION. 4. SELECTIVE LEFT INFERIOR PHRENIC ARTERIOGRAM AND PERCUTANEOUS COIL EMBOLIZATION. 5. SELECTIVE SPLENIC ARTERIOGRAM MEDICATIONS: None ANESTHESIA/SEDATION: Moderate (conscious) sedation was employed during this procedure. A  total of Versed 5 mg and Fentanyl 100 mcg was administered intravenously. Moderate Sedation Time: 105 minutes. The patient's level of consciousness and vital signs were monitored continuously by radiology nursing throughout the procedure under my direct supervision. CONTRAST:  80 cc Isovue 300 FLUOROSCOPY TIME:  Fluoroscopy Time: 14 minutes 18 seconds (693 mGy). COMPLICATIONS: None immediate. PROCEDURE: Informed consent was obtained from the patient and the patient's family following explanation of the procedure, risks, benefits and alternatives. The patient understands, agrees and consents for the procedure. All questions were addressed. A time out was performed prior to the initiation of the procedure. Maximal barrier sterile technique utilized including caps, mask, sterile gowns, sterile gloves, large sterile drape, hand hygiene, and Betadine prep. The right femoral head was marked fluoroscopically. Under ultrasound guidance, the right common femoral artery was accessed with a micropuncture kit after the overlying soft tissues were anesthetized with 1% lidocaine. An ultrasound image was saved for documentation purposes. The micropuncture sheath was exchanged for a 5 [place] vascular sheath over a Bentson wire. A closure arteriogram was performed through the side of the sheath confirming access within the right common femoral artery. Over a Bentson wire, a Mickelson catheter was advanced to the  level of the thoracic aorta where it was back bled and flushed. The catheter was then utilized to select the celiac artery and a selective celiac arteriogram was performed. Mickelson catheter was slowly retracted and utilized select the left gastric artery. Contrast injection confirmed the atretic appearance of this residual vessel. Next, a Fathom 14 micro wire was utilized to advance a regular Renegade microcatheter into the mid aspect of the remnant of this vessel. Contrast injection confirmed appropriate positioning. The residual left gastric artery was then subsequently embolized with multiple overlapping 3 and 2 mm interlock coils to near the level of its origin. Post embolization arteriogram images were obtained. Next, the [facility] catheter was utilized to select the left inferior phrenic artery. Again, the Fathom 14 micro wire was utilized to advance a regular Renegade microcatheter into the distal aspect of the left inferior phrenic artery, beyond the location of the pseudoaneurysm and active extravasation. The left inferior phrenic artery was then percutaneously coil embolized with multiple overlapping 3 and 4 mm diameter interlock coils across the area of pseudoaneurysm and active extravasation. Post embolization arteriogram images were obtained. Again, the Fathom 14 micro wire was utilized to advance a regular Renegade microcatheter into the distal aspect of the splenic artery. Several splenic arteriograms were performed both from the distal and mid aspects of the splenic artery. Images were reviewed and the procedure was terminated. All wires, catheters and sheaths were removed from the patient. Hemostasis was achieved at the right groin access site with deployment of an Exoseal closure device and manual compression. A dressing was placed. The patient tolerated the above procedure well without immediate postprocedural complication. FINDINGS: Initial celiac arteriogram demonstrates a conventional  branching pattern, however note diffuse vasospasm was noted throughout the celiac arterial system secondary to patient's hypovolemic state. Contrast injection demonstrates the origin of a residual, previously surgically ligated left gastric artery. Given concern for active extravasation from the gastric remnant as well as the diffuse vasospasm, this vessel was subsequently percutaneously coil embolized the level of the vessels origin. Post embolization arteriogram images demonstrate complete occlusion of this residual vessel. Contrast injection of the left inferior phrenic artery demonstrates active extravasation and pseudoaneurysm formation from its mid aspect, findings compatible with area of active extravasation demonstrated on preceding abdominal CT (coronal  images 46 - 51, series 6). The inferior phrenic artery was percutaneously coil embolized across the level of pseudoaneurysm and active extravasation. Post embolization arteriogram images demonstrate complete occlusion of the left inferior phrenic artery without residual extravasation. Dedicated splenic arteriograms were negative for discrete area of vessel irregularity or contrast extravasation. As such, embolization of the splenic artery was not performed at this time. IMPRESSION: 1. Technically successful percutaneous coil embolization of the left inferior phrenic artery for active extravasation. 2. Technically successful prophylactic percutaneous coil embolization of the remnant left gastric artery. 3. No definite areas of active extravasation or vessel irregularity are identified arising from the splenic artery. As such, splenic embolization was not performed at this time. Above findings discussed with Dr. Janann Colonel (critical care) at the time of procedure completion. Electronically Signed   By: Sandi Mariscal M.D.   On: 02/13/2017 08:20   Ir Angiogram Selective Each Additional Vessel  Result Date: 02/13/2017 INDICATION: Recent history of near total  gastrectomy for gastroparesis at institution in [place], now with markedly abnormal CT scan with active extravasation within left upper abdominal quadrant. Please perform mesenteric arteriogram and potential percutaneous coil embolization. EXAM: 1. ULTRASOUND GUIDANCE FOR ARTERIAL ACCESS 2. SELECTIVE CELIAC ARTERIOGRAM 3. SELECTIVE LEFT GASTRIC REMNANT ARTERIOGRAM AND PERCUTANEOUS COIL EMBOLIZATION. 4. SELECTIVE LEFT INFERIOR PHRENIC ARTERIOGRAM AND PERCUTANEOUS COIL EMBOLIZATION. 5. SELECTIVE SPLENIC ARTERIOGRAM MEDICATIONS: None ANESTHESIA/SEDATION: Moderate (conscious) sedation was employed during this procedure. A total of Versed 5 mg and Fentanyl 100 mcg was administered intravenously. Moderate Sedation Time: 105 minutes. The patient's level of consciousness and vital signs were monitored continuously by radiology nursing throughout the procedure under my direct supervision. CONTRAST:  80 cc Isovue 300 FLUOROSCOPY TIME:  Fluoroscopy Time: 14 minutes 18 seconds (693 mGy). COMPLICATIONS: None immediate. PROCEDURE: Informed consent was obtained from the patient and the patient's family following explanation of the procedure, risks, benefits and alternatives. The patient understands, agrees and consents for the procedure. All questions were addressed. A time out was performed prior to the initiation of the procedure. Maximal barrier sterile technique utilized including caps, mask, sterile gowns, sterile gloves, large sterile drape, hand hygiene, and Betadine prep. The right femoral head was marked fluoroscopically. Under ultrasound guidance, the right common femoral artery was accessed with a micropuncture kit after the overlying soft tissues were anesthetized with 1% lidocaine. An ultrasound image was saved for documentation purposes. The micropuncture sheath was exchanged for a 5 [place] vascular sheath over a Bentson wire. A closure arteriogram was performed through the side of the sheath confirming access within  the right common femoral artery. Over a Bentson wire, a Mickelson catheter was advanced to the level of the thoracic aorta where it was back bled and flushed. The catheter was then utilized to select the celiac artery and a selective celiac arteriogram was performed. Mickelson catheter was slowly retracted and utilized select the left gastric artery. Contrast injection confirmed the atretic appearance of this residual vessel. Next, a Fathom 14 micro wire was utilized to advance a regular Renegade microcatheter into the mid aspect of the remnant of this vessel. Contrast injection confirmed appropriate positioning. The residual left gastric artery was then subsequently embolized with multiple overlapping 3 and 2 mm interlock coils to near the level of its origin. Post embolization arteriogram images were obtained. Next, the [facility] catheter was utilized to select the left inferior phrenic artery. Again, the Fathom 14 micro wire was utilized to advance a regular Renegade microcatheter into the distal aspect of the left inferior  phrenic artery, beyond the location of the pseudoaneurysm and active extravasation. The left inferior phrenic artery was then percutaneously coil embolized with multiple overlapping 3 and 4 mm diameter interlock coils across the area of pseudoaneurysm and active extravasation. Post embolization arteriogram images were obtained. Again, the Fathom 14 micro wire was utilized to advance a regular Renegade microcatheter into the distal aspect of the splenic artery. Several splenic arteriograms were performed both from the distal and mid aspects of the splenic artery. Images were reviewed and the procedure was terminated. All wires, catheters and sheaths were removed from the patient. Hemostasis was achieved at the right groin access site with deployment of an Exoseal closure device and manual compression. A dressing was placed. The patient tolerated the above procedure well without immediate  postprocedural complication. FINDINGS: Initial celiac arteriogram demonstrates a conventional branching pattern, however note diffuse vasospasm was noted throughout the celiac arterial system secondary to patient's hypovolemic state. Contrast injection demonstrates the origin of a residual, previously surgically ligated left gastric artery. Given concern for active extravasation from the gastric remnant as well as the diffuse vasospasm, this vessel was subsequently percutaneously coil embolized the level of the vessels origin. Post embolization arteriogram images demonstrate complete occlusion of this residual vessel. Contrast injection of the left inferior phrenic artery demonstrates active extravasation and pseudoaneurysm formation from its mid aspect, findings compatible with area of active extravasation demonstrated on preceding abdominal CT (coronal images 46 - 51, series 6). The inferior phrenic artery was percutaneously coil embolized across the level of pseudoaneurysm and active extravasation. Post embolization arteriogram images demonstrate complete occlusion of the left inferior phrenic artery without residual extravasation. Dedicated splenic arteriograms were negative for discrete area of vessel irregularity or contrast extravasation. As such, embolization of the splenic artery was not performed at this time. IMPRESSION: 1. Technically successful percutaneous coil embolization of the left inferior phrenic artery for active extravasation. 2. Technically successful prophylactic percutaneous coil embolization of the remnant left gastric artery. 3. No definite areas of active extravasation or vessel irregularity are identified arising from the splenic artery. As such, splenic embolization was not performed at this time. Above findings discussed with Dr. Janann Colonel (critical care) at the time of procedure completion. Electronically Signed   By: Sandi Mariscal M.D.   On: 02/13/2017 08:20   Ir Angiogram Selective  Each Additional Vessel  Result Date: 02/13/2017 INDICATION: Recent history of near total gastrectomy for gastroparesis at institution in [place], now with markedly abnormal CT scan with active extravasation within left upper abdominal quadrant. Please perform mesenteric arteriogram and potential percutaneous coil embolization. EXAM: 1. ULTRASOUND GUIDANCE FOR ARTERIAL ACCESS 2. SELECTIVE CELIAC ARTERIOGRAM 3. SELECTIVE LEFT GASTRIC REMNANT ARTERIOGRAM AND PERCUTANEOUS COIL EMBOLIZATION. 4. SELECTIVE LEFT INFERIOR PHRENIC ARTERIOGRAM AND PERCUTANEOUS COIL EMBOLIZATION. 5. SELECTIVE SPLENIC ARTERIOGRAM MEDICATIONS: None ANESTHESIA/SEDATION: Moderate (conscious) sedation was employed during this procedure. A total of Versed 5 mg and Fentanyl 100 mcg was administered intravenously. Moderate Sedation Time: 105 minutes. The patient's level of consciousness and vital signs were monitored continuously by radiology nursing throughout the procedure under my direct supervision. CONTRAST:  80 cc Isovue 300 FLUOROSCOPY TIME:  Fluoroscopy Time: 14 minutes 18 seconds (693 mGy). COMPLICATIONS: None immediate. PROCEDURE: Informed consent was obtained from the patient and the patient's family following explanation of the procedure, risks, benefits and alternatives. The patient understands, agrees and consents for the procedure. All questions were addressed. A time out was performed prior to the initiation of the procedure. Maximal barrier sterile technique utilized including  caps, mask, sterile gowns, sterile gloves, large sterile drape, hand hygiene, and Betadine prep. The right femoral head was marked fluoroscopically. Under ultrasound guidance, the right common femoral artery was accessed with a micropuncture kit after the overlying soft tissues were anesthetized with 1% lidocaine. An ultrasound image was saved for documentation purposes. The micropuncture sheath was exchanged for a 5 [place] vascular sheath over a Bentson wire. A  closure arteriogram was performed through the side of the sheath confirming access within the right common femoral artery. Over a Bentson wire, a Mickelson catheter was advanced to the level of the thoracic aorta where it was back bled and flushed. The catheter was then utilized to select the celiac artery and a selective celiac arteriogram was performed. Mickelson catheter was slowly retracted and utilized select the left gastric artery. Contrast injection confirmed the atretic appearance of this residual vessel. Next, a Fathom 14 micro wire was utilized to advance a regular Renegade microcatheter into the mid aspect of the remnant of this vessel. Contrast injection confirmed appropriate positioning. The residual left gastric artery was then subsequently embolized with multiple overlapping 3 and 2 mm interlock coils to near the level of its origin. Post embolization arteriogram images were obtained. Next, the [facility] catheter was utilized to select the left inferior phrenic artery. Again, the Fathom 14 micro wire was utilized to advance a regular Renegade microcatheter into the distal aspect of the left inferior phrenic artery, beyond the location of the pseudoaneurysm and active extravasation. The left inferior phrenic artery was then percutaneously coil embolized with multiple overlapping 3 and 4 mm diameter interlock coils across the area of pseudoaneurysm and active extravasation. Post embolization arteriogram images were obtained. Again, the Fathom 14 micro wire was utilized to advance a regular Renegade microcatheter into the distal aspect of the splenic artery. Several splenic arteriograms were performed both from the distal and mid aspects of the splenic artery. Images were reviewed and the procedure was terminated. All wires, catheters and sheaths were removed from the patient. Hemostasis was achieved at the right groin access site with deployment of an Exoseal closure device and manual compression. A  dressing was placed. The patient tolerated the above procedure well without immediate postprocedural complication. FINDINGS: Initial celiac arteriogram demonstrates a conventional branching pattern, however note diffuse vasospasm was noted throughout the celiac arterial system secondary to patient's hypovolemic state. Contrast injection demonstrates the origin of a residual, previously surgically ligated left gastric artery. Given concern for active extravasation from the gastric remnant as well as the diffuse vasospasm, this vessel was subsequently percutaneously coil embolized the level of the vessels origin. Post embolization arteriogram images demonstrate complete occlusion of this residual vessel. Contrast injection of the left inferior phrenic artery demonstrates active extravasation and pseudoaneurysm formation from its mid aspect, findings compatible with area of active extravasation demonstrated on preceding abdominal CT (coronal images 46 - 51, series 6). The inferior phrenic artery was percutaneously coil embolized across the level of pseudoaneurysm and active extravasation. Post embolization arteriogram images demonstrate complete occlusion of the left inferior phrenic artery without residual extravasation. Dedicated splenic arteriograms were negative for discrete area of vessel irregularity or contrast extravasation. As such, embolization of the splenic artery was not performed at this time. IMPRESSION: 1. Technically successful percutaneous coil embolization of the left inferior phrenic artery for active extravasation. 2. Technically successful prophylactic percutaneous coil embolization of the remnant left gastric artery. 3. No definite areas of active extravasation or vessel irregularity are identified arising from the splenic  artery. As such, splenic embolization was not performed at this time. Above findings discussed with Dr. Janann Colonel (critical care) at the time of procedure completion.  Electronically Signed   By: Sandi Mariscal M.D.   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 Please perform mesenteric arteriogram and potential percutaneous coil embolization. EXAM: 1. ULTRASOUND GUIDANCE FOR ARTERIAL ACCESS 2. SELECTIVE CELIAC ARTERIOGRAM 3. SELECTIVE LEFT GASTRIC REMNANT ARTERIOGRAM AND PERCUTANEOUS COIL EMBOLIZATION. 4. SELECTIVE LEFT INFERIOR PHRENIC ARTERIOGRAM AND PERCUTANEOUS COIL EMBOLIZATION. 5. SELECTIVE SPLENIC ARTERIOGRAM MEDICATIONS: None ANESTHESIA/SEDATION: Moderate (conscious) sedation was employed during this procedure. A total of Versed 5 mg and Fentanyl 100 mcg was administered intravenously. Moderate Sedation Time: 105 minutes. The patient's level of consciousness and vital signs were monitored continuously by radiology nursing throughout the procedure under my direct supervision. CONTRAST:  80 cc Isovue 300 FLUOROSCOPY TIME:  Fluoroscopy Time: 14 minutes 18 seconds (693 mGy). COMPLICATIONS: None immediate. PROCEDURE: Informed consent was obtained from the patient and the patient's family following explanation of the procedure, risks, benefits and alternatives. The patient understands, agrees and consents for the procedure. All questions were addressed. A time out was performed prior to the initiation of the procedure. Maximal barrier sterile technique utilized including caps, mask, sterile gowns, sterile gloves, large sterile drape, hand hygiene, and Betadine prep. The right femoral head was marked fluoroscopically. Under ultrasound guidance, the right common femoral artery was accessed with a micropuncture kit after the overlying soft tissues were anesthetized with 1% lidocaine. An ultrasound image was saved for documentation purposes. The  micropuncture sheath was exchanged for a 5 [place] vascular sheath over a Bentson wire. A closure arteriogram was performed through the side of the sheath confirming access within the right common femoral artery. Over a Bentson wire, a Mickelson catheter was advanced to the level of the thoracic aorta where it was back bled and flushed. The catheter was then utilized to select the celiac artery and a selective celiac arteriogram was performed. Mickelson catheter was slowly retracted and utilized select the left gastric artery. Contrast injection confirmed the atretic appearance of this residual vessel. Next, a Fathom 14 micro wire was utilized to advance a regular Renegade microcatheter into the mid aspect of the remnant of this vessel. Contrast injection confirmed appropriate positioning. The residual left gastric artery was then subsequently embolized with multiple overlapping 3 and 2 mm interlock coils to near the level of its origin. Post embolization arteriogram images were obtained. Next, the [facility] catheter was utilized to select the left inferior phrenic artery. Again, the Fathom 14 micro wire was utilized to advance a regular Renegade microcatheter into the distal aspect of the left inferior phrenic artery, beyond the location of the pseudoaneurysm and active extravasation. The left inferior phrenic artery was then percutaneously coil embolized with multiple overlapping 3 and 4 mm diameter interlock coils across the area of pseudoaneurysm and active extravasation. Post embolization arteriogram images were obtained. Again, the Fathom 14 micro wire was utilized to advance a regular Renegade microcatheter into the distal aspect of the splenic artery. Several splenic arteriograms were performed both from the distal and mid aspects of the splenic artery. Images were reviewed and the procedure was terminated. All wires, catheters and sheaths were removed from the patient. Hemostasis was achieved at the right  groin access  site with deployment of an Exoseal closure device and manual compression. A dressing was placed. The patient tolerated the above procedure well without immediate postprocedural complication. FINDINGS: Initial celiac arteriogram demonstrates a conventional branching pattern, however note diffuse vasospasm was noted throughout the celiac arterial system secondary to patient's hypovolemic state. Contrast injection demonstrates the origin of a residual, previously surgically ligated left gastric artery. Given concern for active extravasation from the gastric remnant as well as the diffuse vasospasm, this vessel was subsequently percutaneously coil embolized the level of the vessels origin. Post embolization arteriogram images demonstrate complete occlusion of this residual vessel. Contrast injection of the left inferior phrenic artery demonstrates active extravasation and pseudoaneurysm formation from its mid aspect, findings compatible with area of active extravasation demonstrated on preceding abdominal CT (coronal images 46 - 51, series 6). The inferior phrenic artery was percutaneously coil embolized across the level of pseudoaneurysm and active extravasation. Post embolization arteriogram images demonstrate complete occlusion of the left inferior phrenic artery without residual extravasation. Dedicated splenic arteriograms were negative for discrete area of vessel irregularity or contrast extravasation. As such, embolization of the splenic artery was not performed at this time. IMPRESSION: 1. Technically successful percutaneous coil embolization of the left inferior phrenic artery for active extravasation. 2. Technically successful prophylactic percutaneous coil embolization of the remnant left gastric artery. 3. No definite areas of active extravasation or vessel irregularity are identified arising from the splenic artery. As such, splenic embolization was not performed at this time. Above findings  discussed with Dr. Janann Colonel (critical care) at the time of procedure completion. Electronically Signed   By: Sandi Mariscal M.D.   On: 02/13/2017 08:20   Dg Abd Portable 1v  Result Date: 02/12/2017 CLINICAL DATA:  ABDOMINAL PAIN,HX GASTRECTOMY IN [place] 6-18 TO 6-30.hematemesis. History prior GI bleeding FLAT PLATE ABDOMEN PER DR CORNETT.PT FOR CT SCAN TODAY EXAM: PORTABLE ABDOMEN - 1 VIEW COMPARISON:  Chest x-ray 11/23/2011 FINDINGS: There is dilatation of numerous small bowel loops in the central abdomen. Surgical clips are identified in the right upper quadrant. No evidence for free intraperitoneal air on this supine views provided. Status post lumbar fusion. IMPRESSION: Findings consistent with partial or early small bowel obstruction. Electronically Signed   By: Nolon Nations M.D.   On: 02/12/2017 09:32   Dg Duanne Limerick W/water Sol Cm  Result Date: 02/13/2017 CLINICAL DATA:  Upper gastrointestinal hemorrhage. Extravasation from the phrenic artery treated by coil embolization yesterday. EXAM: WATER SOLUBLE UPPER GI SERIES TECHNIQUE: Single-column upper GI series was performed using water soluble contrast. CONTRAST:  Oral Isovue-300, approximately 100 ml. COMPARISON:  CT and interventional studies from yesterday. FLUOROSCOPY TIME:  Fluoroscopy Time: 54 seconds of low-dose pulsed fluoro Radiation Exposure Index (if provided by the fluoroscopic device): 13.7 mGy Number of Acquired Spot Images: 0 FINDINGS: The scout abdominal radiograph demonstrates embolization coils in the left upper quadrant. There is mild bowel distention and mild residual intraluminal contrast in the right lower quadrant. The patient swallowed the enteric contrast without difficulty. 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 No aspiration was observed. The gastroesophageal junction is patent. There is adequate filling of the gastric pouch. Underlying mucosal thickening and a filling defect in the pouch are difficult to exclude. There is spontaneous  drainage from the gastric pouch into the gastrojejunostomy. No extravasation demonstrated. IMPRESSION: 1. No evidence of obstruction or leak status post partial gastrectomy and bypass. 2. Limited mucosal assessment within the gastric pouch with possible mucosal thickening and/or intraluminal clot. Electronically Signed   By:  Richardean Sale M.D.   On: 02/13/2017 10:17   Suring Guide Roadmapping  Result Date: 02/13/2017 INDICATION: Recent history of near total gastrectomy for gastroparesis at institution in [place], now with markedly abnormal CT scan with active extravasation within left upper abdominal quadrant. Please perform mesenteric arteriogram and potential percutaneous coil embolization. EXAM: 1. ULTRASOUND GUIDANCE FOR ARTERIAL ACCESS 2. SELECTIVE CELIAC ARTERIOGRAM 3. SELECTIVE LEFT GASTRIC REMNANT ARTERIOGRAM AND PERCUTANEOUS COIL EMBOLIZATION. 4. SELECTIVE LEFT INFERIOR PHRENIC ARTERIOGRAM AND PERCUTANEOUS COIL EMBOLIZATION. 5. SELECTIVE SPLENIC ARTERIOGRAM MEDICATIONS: None ANESTHESIA/SEDATION: Moderate (conscious) sedation was employed during this procedure. A total of Versed 5 mg and Fentanyl 100 mcg was administered intravenously. Moderate Sedation Time: 105 minutes. The patient's level of consciousness and vital signs were monitored continuously by radiology nursing throughout the procedure under my direct supervision. CONTRAST:  80 cc Isovue 300 FLUOROSCOPY TIME:  Fluoroscopy Time: 14 minutes 18 seconds (693 mGy). COMPLICATIONS: None immediate. PROCEDURE: Informed consent was obtained from the patient and the patient's family following explanation of the procedure, risks, benefits and alternatives. The patient understands, agrees and consents for the procedure. All questions were addressed. A time out was performed prior to the initiation of the procedure. Maximal barrier sterile technique utilized including caps, mask, sterile gowns, sterile gloves, large sterile  drape, hand hygiene, and Betadine prep. The right femoral head was marked fluoroscopically. Under ultrasound guidance, the right common femoral artery was accessed with a micropuncture kit after the overlying soft tissues were anesthetized with 1% lidocaine. An ultrasound image was saved for documentation purposes. The micropuncture sheath was exchanged for a 5 [place] vascular sheath over a Bentson wire. A closure arteriogram was performed through the side of the sheath confirming access within the right common femoral artery. Over a Bentson wire, a Mickelson catheter was advanced to the level of the thoracic aorta where it was back bled and flushed. The catheter was then utilized to select the celiac artery and a selective celiac arteriogram was performed. Mickelson catheter was slowly retracted and utilized select the left gastric artery. Contrast injection confirmed the atretic appearance of this residual vessel. Next, a Fathom 14 micro wire was utilized to advance a regular Renegade microcatheter into the mid aspect of the remnant of this vessel. Contrast injection confirmed appropriate positioning. The residual left gastric artery was then subsequently embolized with multiple overlapping 3 and 2 mm interlock coils to near the level of its origin. Post embolization arteriogram images were obtained. Next, the [facility] catheter was utilized to select the left inferior phrenic artery. Again, the Fathom 14 micro wire was utilized to advance a regular Renegade microcatheter into the distal aspect of the left inferior phrenic artery, beyond the location of the pseudoaneurysm and active extravasation. The left inferior phrenic artery was then percutaneously coil embolized with multiple overlapping 3 and 4 mm diameter interlock coils across the area of pseudoaneurysm and active extravasation. Post embolization arteriogram images were obtained. Again, the Fathom 14 micro wire was utilized to advance a regular Renegade  microcatheter into the distal aspect of the splenic artery. Several splenic arteriograms were performed both from the distal and mid aspects of the splenic artery. Images were reviewed and the procedure was terminated. All wires, catheters and sheaths were removed from the patient. Hemostasis was achieved at the right groin access site with deployment of an Exoseal closure device and manual compression. A dressing was placed. The patient tolerated the above procedure well without immediate postprocedural  complication. FINDINGS: Initial celiac arteriogram demonstrates a conventional branching pattern, however note diffuse vasospasm was noted throughout the celiac arterial system secondary to patient's hypovolemic state. Contrast injection demonstrates the origin of a residual, previously surgically ligated left gastric artery. Given concern for active extravasation from the gastric remnant as well as the diffuse vasospasm, this vessel was subsequently percutaneously coil embolized the level of the vessels origin. Post embolization arteriogram images demonstrate complete occlusion of this residual vessel. Contrast injection of the left inferior phrenic artery demonstrates active extravasation and pseudoaneurysm formation from its mid aspect, findings compatible with area of active extravasation demonstrated on preceding abdominal CT (coronal images 46 - 51, series 6). The inferior phrenic artery was percutaneously coil embolized across the level of pseudoaneurysm and active extravasation. Post embolization arteriogram images demonstrate complete occlusion of the left inferior phrenic artery without residual extravasation. Dedicated splenic arteriograms were negative for discrete area of vessel irregularity or contrast extravasation. As such, embolization of the splenic artery was not performed at this time. IMPRESSION: 1. Technically successful percutaneous coil embolization of the left inferior phrenic artery for  active extravasation. 2. Technically successful prophylactic percutaneous coil embolization of the remnant left gastric artery. 3. No definite areas of active extravasation or vessel irregularity are identified arising from the splenic artery. As such, splenic embolization was not performed at this time. Above findings discussed with Dr. Janann Colonel (critical care) at the time of procedure completion. Electronically Signed   By: Sandi Mariscal M.D.   On: 02/13/2017 08:20    Labs:  CBC:  Recent Labs  02/11/17 1950  02/12/17 0646 02/12/17 1029 02/12/17 1901 02/13/17 0352 02/13/17 1220  WBC 10.7*  --  15.6* 26.6*  --   --   --   HGB 6.8*  < > 8.6* 5.7* 7.7* 9.5* 8.9*  HCT 22.3*  < > 26.9* 18.0* 24.0* 28.7* 26.4*  PLT 539*  --  662* 563*  --   --   --   < > = values in this interval not displayed.  COAGS:  Recent Labs  02/11/17 1950 02/12/17 0646 02/13/17 0352  INR 1.23 1.26 1.39  APTT 40*  --   --     BMP:  Recent Labs  02/11/17 1950 02/11/17 2003 02/12/17 0646  NA 136 139 137  K 4.0 4.1 4.1  CL 108 108 111  CO2 19*  --  18*  GLUCOSE 139* 134* 128*  BUN 22* 23* 24*  CALCIUM 8.1*  --  7.9*  CREATININE 1.01 0.90 0.80  GFRNONAA >60  --  >60  GFRAA >60  --  >60    LIVER FUNCTION TESTS:  Recent Labs  02/11/17 1950  BILITOT 0.3  AST 11*  ALT 8*  ALKPHOS 67  PROT 5.2*  ALBUMIN 2.2*    Assessment and Plan: 1. UGI bleed, s/p angioembolization of left inferior phrenic artery and prophylactic embo of the gastric artery remnant  Patient is improved today.  His hgb has come up to 9.5 and his vitals are stable.  No further evidence of bleeding.  Further care per primary service.  Electronically Signed: Henreitta Cea 02/13/2017, 1:42 PM   I spent a total of 15 Minutes at the the patient's bedside AND on the patient's hospital floor or unit, greater than 50% of which was counseling/coordinating care for UGI bleed, s/p embo

## 2017-02-13 NOTE — Progress Notes (Signed)
Name: Jeffrey Crane MRN: [id number] DOB: October 20, 1960    ADMISSION DATE:  02/11/2017 CONSULTATION DATE:  02/12/17  REFERRING MD :  Burnis Medin  CHIEF COMPLAINT:  GI Bleed   HISTORY OF PRESENT ILLNESS:  Jeffrey Crane is a 56 y.o. male with a PMH as outlined below.  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  He is currently in [place] visiting family. He had done well post op without complaints.  Drove down to [place] on 02/09/17 and on afternoon of 7/4, he had large amount of hematemesis per report.  He was subsequently brought to ED for further evaluation.  In ED, he was guaiac positive, BP soft at 97/64, Hgb 6.8, PLT 539.  He was transfused 2u PRBC and started on PPI gtt.  He is on warfarin for left atrial thrombus (INR 1.23 on admit).  He denies excessive ASA, NSAID's; but does admit to taking 2 - 3 "goody's" on 7/3 and 7/4 due to headache and abd discomfort.  Denies any current EtOH (remot user) or substance use, current smoker.  He denies hx of GI bleed; however, PMH indicates previous GIB (peptic ulcer disease per notes from [place]).  Also has hx of iron deficiency anemia on intermittent IV therapy.  On AM 7/5, he had oral temp of 764F but rectal of 104.  PCCM was subsequently called for concern of occult sepsis. Repeat temp check was 97.64F oral and 100.8 rectal.  SUBJECTIVE:  No bleeding overnight, Hg stable at 9.5.  VITAL SIGNS: Temp:  [97.3 F (36.3 C)-101.4 F (38.6 C)] 98.4 F (36.9 C) (07/06 0700) Pulse Rate:  [64-123] 89 (07/06 0800) Resp:  [0-40] 24 (07/06 0800) BP: (118-171)/(73-127) 149/92 (07/06 0800) SpO2:  [96 %-100 %] 100 % (07/06 0904)  PHYSICAL EXAMINATION: General: Adult male, resting in bed, in NAD though complains of intermittent abdominal pain at LUQ. Neuro: A&O x 3, non-focal.  HEENT: [place]/AT. PERRL, sclerae anicteric. Cardiovascular: RRR, Nl S1/S2, no M/R/G.  Lungs: Respirations even and  unlabored.  CTA bilaterally, No W/R/R. Abdomen: BS hypoactive, soft, tender.  Midline incisions with dressings C/D/I. Musculoskeletal: No gross deformities, no edema.  Skin: Intact, warm, no rashes.  Recent Labs Lab 02/11/17 1950 02/11/17 2003 02/12/17 0646  NA 136 139 137  K 4.0 4.1 4.1  CL 108 108 111  CO2 19*  --  18*  BUN 22* 23* 24*  CREATININE 1.01 0.90 0.80  GLUCOSE 139* 134* 128*   Recent Labs Lab 02/11/17 1950  02/12/17 0646 02/12/17 1029 02/12/17 1901 02/13/17 0352  HGB 6.8*  < > 8.6* 5.7* 7.7* 9.5*  HCT 22.3*  < > 26.9* 18.0* 24.0* 28.7*  WBC 10.7*  --  15.6* 26.6*  --   --   PLT 539*  --  662* 563*  --   --   < > = values in this interval not displayed. Ct Abdomen Pelvis W Contrast  Addendum Date: 02/13/2017   ADDENDUM REPORT: 02/13/2017 09:36 ADDENDUM: Subsequent to the digital angiogram and discussion with Dr. Pascal Lux of interventional radiology, CT findings are felt to be related to rupture and acute extravasation from the left inferior phrenic artery. The appearance of the spleen is likely related to mass effect and hypotension, rather than splenic rupture. The pseudoaneurysm formation within the left inferior phrenic artery may be secondary to gastric surgical dehiscence with extravasation of gastric contents into the left upper quadrant. Electronically Signed   By: Abigail Miyamoto  M.D.   On: 02/13/2017 09:36   Result Date: 02/13/2017 CLINICAL DATA:  Left-sided pain and hemoptysis since yesterday. Gastroparesis surgery 2 weeks ago. GI bleeding. EXAM: CT ABDOMEN AND PELVIS WITH CONTRAST TECHNIQUE: Multidetector CT imaging of the abdomen and pelvis was performed using the standard protocol following bolus administration of intravenous contrast. CONTRAST:  152m ISOVUE-300 IOPAMIDOL (ISOVUE-300) INJECTION 61% COMPARISON:  Plain films of earlier today.  No prior CT. FINDINGS: Lower chest: Minimal motion degradation at the lung bases. Subsegmental atelectasis at the left lung  base. Normal heart size with right coronary artery atherosclerosis. Trace right and small left pleural effusion. Hepatobiliary: Normal liver. Cholecystectomy, without biliary ductal dilatation. Pancreas: Normal, without mass or ductal dilatation. Pancreas is displaced anteriorly by left upper quadrant blood. Spleen: Splenic rupture, as evidenced by perisplenic hemorrhage and areas of hypoattenuation throughout the spleen. Active extravasation of large volume left upper quadrant hemorrhage, likely from the splenic artery. Example image 16/series 3 and on delayed images. Adrenals/Urinary Tract: Normal adrenal glands. Heterogeneous enhancement involves both kidneys, most apparent on delayed images. No hydronephrosis. Normal urinary bladder. Stomach/Bowel: Surgical changes about the proximal stomach. The stomach is difficult to delineate from the surrounding hemorrhage. Suspicion of direct extension of blood into the stomach, including on image 17/series 3. No gastric obstruction. Normal colon and terminal ileum. Normal small bowel caliber. Prior enterotomy. No free intraperitoneal air. Vascular/Lymphatic: Aortic and branch vessel atherosclerosis. No abdominopelvic adenopathy. Reproductive: Normal prostate. Other: Relatively small volume pelvic fluid with mild complexity, likely hemorrhage. Moderate volume intraperitoneal and retroperitoneal hemorrhage throughout the abdomen. Fluid containing periumbilical hernia. Musculoskeletal: Bilateral femoral head avascular necrosis. Posterior fixation at L4-5. IMPRESSION: 1. Splenic rupture with large volume active extravasation throughout the left upper quadrant, likely arising from distal splenic artery branches. 2. Apparent communication of hemorrhage into the stomach. 3. Bilateral heterogeneous renal enhancement may relate to pyelonephritis. 4. Tiny bilateral pleural effusions. 5. Bilateral femoral head avascular necrosis. Critical test results telephoned to.Dr. YNelda Marseille at  the time of interpretation at 2:48 p.m.on 02/12/2017. Electronically Signed: By: KAbigail MiyamotoM.D. On: 02/12/2017 14:57   Ir Angiogram Visceral Selective  Result Date: 02/13/2017 INDICATION: Recent history of near total gast[place] with markedly abnormal CT scan with active extravasation within left upper abdominal quadrant. Please perform mesenteric arteriogram and potential percutaneous coil embolization. EXAM: 1. ULTRASOUND GUIDANCE FOR ARTERIAL ACCESS 2. SELECTIVE CELIAC ARTERIOGRAM 3. SELECTIVE LEFT GASTRIC REMNANT ARTERIOGRAM AND PERCUTANEOUS COIL EMBOLIZATION. 4. SELECTIVE LEFT INFERIOR PHRENIC ARTERIOGRAM AND PERCUTANEOUS COIL EMBOLIZATION. 5. SELECTIVE SPLENIC ARTERIOGRAM MEDICATIONS: None ANESTHESIA/SEDATION: Moderate (conscious) sedation was employed during this procedure. A total of Versed 5 mg and Fentanyl 100 mcg was administered intravenously. Moderate Sedation Time: 105 minutes. The patient's level of consciousness and vital signs were monitored continuously by radiology nursing throughout the procedure under my direct supervision. CONTRAST:  80 cc Isovue 300 FLUOROSCOPY TIME:  Fluoroscopy Time: 14 minutes 18 seconds (693 mGy). COMPLICATIONS: None immediate. PROCEDURE: Informed consent was obtained from the patient and the patient's family following explanation of the procedure, risks, benefits and alternatives. The patient understands, agrees and consents for the procedure. All questions were addressed. A time out was performed prior to the initiation of the procedure. Maximal barrier sterile technique utilized including caps, mask, sterile gowns, sterile gloves, large sterile drape, hand hygiene, and Betadine prep. The right femoral head was marked fluoroscopically. Under ultrasound guidance, the right common femoral artery was accessed with a micropuncture kit after the overlying soft  tissues were anesthetized with 1% lidocaine. An ultrasound image  was saved for documentation purposes. The micropuncture sheath was exchanged for a 5 [place] vascular sheath over a Bentson wire. A closure arteriogram was performed through the side of the sheath confirming access within the right common femoral artery. Over a Bentson wire, a Mickelson catheter was advanced to the level of the thoracic aorta where it was back bled and flushed. The catheter was then utilized to select the celiac artery and a selective celiac arteriogram was performed. Mickelson catheter was slowly retracted and utilized select the left gastric artery. Contrast injection confirmed the atretic appearance of this residual vessel. Next, a Fathom 14 micro wire was utilized to advance a regular Renegade microcatheter into the mid aspect of the remnant of this vessel. Contrast injection confirmed appropriate positioning. The residual left gastric artery was then subsequently embolized with multiple overlapping 3 and 2 mm interlock coils to near the level of its origin. Post embolization arteriogram images were obtained. Next, the [facility] catheter was utilized to select the left inferior phrenic artery. Again, the Fathom 14 micro wire was utilized to advance a regular Renegade microcatheter into the distal aspect of the left inferior phrenic artery, beyond the location of the pseudoaneurysm and active extravasation. The left inferior phrenic artery was then percutaneously coil embolized with multiple overlapping 3 and 4 mm diameter interlock coils across the area of pseudoaneurysm and active extravasation. Post embolization arteriogram images were obtained. Again, the Fathom 14 micro wire was utilized to advance a regular Renegade microcatheter into the distal aspect of the splenic artery. Several splenic arteriograms were performed both from the distal and mid aspects of the splenic artery. Images were reviewed and the procedure was terminated. All wires, catheters and sheaths were removed from the  patient. Hemostasis was achieved at the right groin access site with deployment of an Exoseal closure device and manual compression. A dressing was placed. The patient tolerated the above procedure well without immediate postprocedural complication. FINDINGS: Initial celiac arteriogram demonstrates a conventional branching pattern, however note diffuse vasospasm was noted throughout the celiac arterial system secondary to patient's hypovolemic state. Contrast injection demonstrates the origin of a residual, previously surgically ligated left gastric artery. Given concern for active extravasation from the gastric remnant as well as the diffuse vasospasm, this vessel was subsequently percutaneously coil embolized the level of the vessels origin. Post embolization arteriogram images demonstrate complete occlusion of this residual vessel. Contrast injection of the left inferior phrenic artery demonstrates active extravasation and pseudoaneurysm formation from its mid aspect, findings compatible with area of active extravasation demonstrated on preceding abdominal CT (coronal images 46 - 51, series 6). The inferior phrenic artery was percutaneously coil embolized across the level of pseudoaneurysm and active extravasation. Post embolization arteriogram images demonstrate complete occlusion of the left inferior phrenic artery without residual extravasation. Dedicated splenic arteriograms were negative for discrete area of vessel irregularity or contrast extravasation. As such, embolization of the splenic artery was not performed at this time. IMPRESSION: 1. Technically successful percutaneous coil embolization of the left inferior phrenic artery for active extravasation. 2. Technically successful prophylactic percutaneous coil embolization of the remnant left gastric artery. 3. No definite areas of active extravasation or vessel irregularity are identified arising from the splenic artery. As such, splenic embolization was  not performed at this time. Above findings discussed with Dr. Janann Colonel (critical care) at the time of procedure completion. Electronically Signed   By: Sandi Mariscal M.D.   On: 02/13/2017  08:20   Ir Angiogram Selective Each Additional Vessel  Result Date: 02/13/2017 INDICATION: Recent history of near total gastrectomy for gastroparesis at institution in [place], now with markedly abnormal CT scan with active extravasation within left upper abdominal quadrant. Please perform mesenteric arteriogram and potential percutaneous coil embolization. EXAM: 1. ULTRASOUND GUIDANCE FOR ARTERIAL ACCESS 2. SELECTIVE CELIAC ARTERIOGRAM 3. SELECTIVE LEFT GASTRIC REMNANT ARTERIOGRAM AND PERCUTANEOUS COIL EMBOLIZATION. 4. SELECTIVE LEFT INFERIOR PHRENIC ARTERIOGRAM AND PERCUTANEOUS COIL EMBOLIZATION. 5. SELECTIVE SPLENIC ARTERIOGRAM MEDICATIONS: None ANESTHESIA/SEDATION: Moderate (conscious) sedation was employed during this procedure. A total of Versed 5 mg and Fentanyl 100 mcg was administered intravenously. Moderate Sedation Time: 105 minutes. The patient's level of consciousness and vital signs were monitored continuously by radiology nursing throughout the procedure under my direct supervision. CONTRAST:  80 cc Isovue 300 FLUOROSCOPY TIME:  Fluoroscopy Time: 14 minutes 18 seconds (693 mGy). COMPLICATIONS: None immediate. PROCEDURE: Informed consent was obtained from the patient and the patient's family following explanation of the procedure, risks, benefits and alternatives. The patient understands, agrees and consents for the procedure. All questions were addressed. A time out was performed prior to the initiation of the procedure. Maximal barrier sterile technique utilized including caps, mask, sterile gowns, sterile gloves, large sterile drape, hand hygiene, and Betadine prep. The right femoral head was marked fluoroscopically. Under ultrasound guidance, the right common femoral artery was accessed with a micropuncture kit after  the overlying soft tissues were anesthetized with 1% lidocaine. An ultrasound image was saved for documentation purposes. The micropuncture sheath was exchanged for a 5 [place] vascular sheath over a Bentson wire. A closure arteriogram was performed through the side of the sheath confirming access within the right common femoral artery. Over a Bentson wire, a Mickelson catheter was advanced to the level of the thoracic aorta where it was back bled and flushed. The catheter was then utilized to select the celiac artery and a selective celiac arteriogram was performed. Mickelson catheter was slowly retracted and utilized select the left gastric artery. Contrast injection confirmed the atretic appearance of this residual vessel. Next, a Fathom 14 micro wire was utilized to advance a regular Renegade microcatheter into the mid aspect of the remnant of this vessel. Contrast injection confirmed appropriate positioning. The residual left gastric artery was then subsequently embolized with multiple overlapping 3 and 2 mm interlock coils to near the level of its origin. Post embolization arteriogram images were obtained. Next, the [facility] catheter was utilized to select the left inferior phrenic artery. Again, the Fathom 14 micro wire was utilized to advance a regular Renegade microcatheter into the distal aspect of the left inferior phrenic artery, beyond the location of the pseudoaneurysm and active extravasation. The left inferior phrenic artery was then percutaneously coil embolized with multiple overlapping 3 and 4 mm diameter interlock coils across the area of pseudoaneurysm and active extravasation. Post embolization arteriogram images were obtained. Again, the Fathom 14 micro wire was utilized to advance a regular Renegade microcatheter into the distal aspect of the splenic artery. Several splenic arteriograms were performed both from the distal and mid aspects of the splenic artery. Images were reviewed and the  procedure was terminated. All wires, catheters and sheaths were removed from the patient. Hemostasis was achieved at the right groin access site with deployment of an Exoseal closure device and manual compression. A dressing was placed. The patient tolerated the above procedure well without immediate postprocedural complication. FINDINGS: Initial celiac arteriogram demonstrates a conventional branching pattern, however note diffuse vasospasm  was noted throughout the celiac arterial system secondary to patient's hypovolemic state. Contrast injection demonstrates the origin of a residual, previously surgically ligated left gastric artery. Given concern for active extravasation from the gastric remnant as well as the diffuse vasospasm, this vessel was subsequently percutaneously coil embolized the level of the vessels origin. Post embolization arteriogram images demonstrate complete occlusion of this residual vessel. Contrast injection of the left inferior phrenic artery demonstrates active extravasation and pseudoaneurysm formation from its mid aspect, findings compatible with area of active extravasation demonstrated on preceding abdominal CT (coronal images 46 - 51, series 6). The inferior phrenic artery was percutaneously coil embolized across the level of pseudoaneurysm and active extravasation. Post embolization arteriogram images demonstrate complete occlusion of the left inferior phrenic artery without residual extravasation. Dedicated splenic arteriograms were negative for discrete area of vessel irregularity or contrast extravasation. As such, embolization of the splenic artery was not performed at this time. IMPRESSION: 1. Technically successful percutaneous coil embolization of the left inferior phrenic artery for active extravasation. 2. Technically successful prophylactic percutaneous coil embolization of the remnant left gastric artery. 3. No definite areas of active extravasation or vessel irregularity  are identified arising from the splenic artery. As such, splenic embolization was not performed at this time. Above findings discussed with Dr. Janann Colonel (critical care) at the time of procedure completion. Electronically Signed   By: Sandi Mariscal M.D.   On: 02/13/2017 08:20   Ir Angiogram Selective Each Additional Vessel  Result Date: 02/13/2017 INDICATION: Recent history of near total gastrectomy for gastroparesis at institution in [place], now with markedly abnormal CT scan with active extravasation within left upper abdominal quadrant. Please perform mesenteric arteriogram and potential percutaneous coil embolization. EXAM: 1. ULTRASOUND GUIDANCE FOR ARTERIAL ACCESS 2. SELECTIVE CELIAC ARTERIOGRAM 3. SELECTIVE LEFT GASTRIC REMNANT ARTERIOGRAM AND PERCUTANEOUS COIL EMBOLIZATION. 4. SELECTIVE LEFT INFERIOR PHRENIC ARTERIOGRAM AND PERCUTANEOUS COIL EMBOLIZATION. 5. SELECTIVE SPLENIC ARTERIOGRAM MEDICATIONS: None ANESTHESIA/SEDATION: Moderate (conscious) sedation was employed during this procedure. A total of Versed 5 mg and Fentanyl 100 mcg was administered intravenously. Moderate Sedation Time: 105 minutes. The patient's level of consciousness and vital signs were monitored continuously by radiology nursing throughout the procedure under my direct supervision. CONTRAST:  80 cc Isovue 300 FLUOROSCOPY TIME:  Fluoroscopy Time: 14 minutes 18 seconds (693 mGy). COMPLICATIONS: None immediate. PROCEDURE: Informed consent was obtained from the patient and the patient's family following explanation of the procedure, risks, benefits and alternatives. The patient understands, agrees and consents for the procedure. All questions were addressed. A time out was performed prior to the initiation of the procedure. Maximal barrier sterile technique utilized including caps, mask, sterile gowns, sterile gloves, large sterile drape, hand hygiene, and Betadine prep. The right femoral head was marked fluoroscopically. Under ultrasound  guidance, the right common femoral artery was accessed with a micropuncture kit after the overlying soft tissues were anesthetized with 1% lidocaine. An ultrasound image was saved for documentation purposes. The micropuncture sheath was exchanged for a 5 [place] vascular sheath over a Bentson wire. A closure arteriogram was performed through the side of the sheath confirming access within the right common femoral artery. Over a Bentson wire, a Mickelson catheter was advanced to the level of the thoracic aorta where it was back bled and flushed. The catheter was then utilized to select the celiac artery and a selective celiac arteriogram was performed. Mickelson catheter was slowly retracted and utilized select the left gastric artery. Contrast injection confirmed the atretic appearance of this residual  vessel. Next, a Fathom 14 micro wire was utilized to advance a regular Renegade microcatheter into the mid aspect of the remnant of this vessel. Contrast injection confirmed appropriate positioning. The residual left gastric artery was then subsequently embolized with multiple overlapping 3 and 2 mm interlock coils to near the level of its origin. Post embolization arteriogram images were obtained. Next, the [facility] catheter was utilized to select the left inferior phrenic artery. Again, the Fathom 14 micro wire was utilized to advance a regular Renegade microcatheter into the distal aspect of the left inferior phrenic artery, beyond the location of the pseudoaneurysm and active extravasation. The left inferior phrenic artery was then percutaneously coil embolized with multiple overlapping 3 and 4 mm diameter interlock coils across the area of pseudoaneurysm and active extravasation. Post embolization arteriogram images were obtained. Again, the Fathom 14 micro wire was utilized to advance a regular Renegade microcatheter into the distal aspect of the splenic artery. Several splenic arteriograms were performed both  from the distal and mid aspects of the splenic artery. Images were reviewed and the procedure was terminated. All wires, catheters and sheaths were removed from the patient. Hemostasis was achieved at the right groin access site with deployment of an Exoseal closure device and manual compression. A dressing was placed. The patient tolerated the above procedure well without immediate postprocedural complication. FINDINGS: Initial celiac arteriogram demonstrates a conventional branching pattern, however note diffuse vasospasm was noted throughout the celiac arterial system secondary to patient's hypovolemic state. Contrast injection demonstrates the origin of a residual, previously surgically ligated left gastric artery. Given concern for active extravasation from the gastric remnant as well as the diffuse vasospasm, this vessel was subsequently percutaneously coil embolized the level of the vessels origin. Post embolization arteriogram images demonstrate complete occlusion of this residual vessel. Contrast injection of the left inferior phrenic artery demonstrates active extravasation and pseudoaneurysm formation from its mid aspect, findings compatible with area of active extravasation demonstrated on preceding abdominal CT (coronal images 46 - 51, series 6). The inferior phrenic artery was percutaneously coil embolized across the level of pseudoaneurysm and active extravasation. Post embolization arteriogram images demonstrate complete occlusion of the left inferior phrenic artery without residual extravasation. Dedicated splenic arteriograms were negative for discrete area of vessel irregularity or contrast extravasation. As such, embolization of the splenic artery was not performed at this time. IMPRESSION: 1. Technically successful percutaneous coil embolization of the left inferior phrenic artery for active extravasation. 2. Technically successful prophylactic percutaneous coil embolization of the remnant left  gastric artery. 3. No definite areas of active extravasation or vessel irregularity are identified arising from the splenic artery. As such, splenic embolization was not performed at this time. Above findings discussed with Dr. Janann Colonel (critical care) at the time of procedure completion. Electronically Signed   By: Sandi Mariscal M.D.   On: 02/13/2017 08:20   Ir Angiogram Selective Each Additional Vessel  Result Date: 02/13/2017 INDICATION: Recent history of near total gastrectomy for gastroparesis at institution in [place], now with markedly abnormal CT scan with active extravasation within left upper abdominal quadrant. Please perform mesenteric arteriogram and potential percutaneous coil embolization. EXAM: 1. ULTRASOUND GUIDANCE FOR ARTERIAL ACCESS 2. SELECTIVE CELIAC ARTERIOGRAM 3. SELECTIVE LEFT GASTRIC REMNANT ARTERIOGRAM AND PERCUTANEOUS COIL EMBOLIZATION. 4. SELECTIVE LEFT INFERIOR PHRENIC ARTERIOGRAM AND PERCUTANEOUS COIL EMBOLIZATION. 5. SELECTIVE SPLENIC ARTERIOGRAM MEDICATIONS: None ANESTHESIA/SEDATION: Moderate (conscious) sedation was employed during this procedure. A total of Versed 5 mg and Fentanyl 100 mcg was administered intravenously. Moderate Sedation  Time: 105 minutes. The patient's level of consciousness and vital signs were monitored continuously by radiology nursing throughout the procedure under my direct supervision. CONTRAST:  80 cc Isovue 300 FLUOROSCOPY TIME:  Fluoroscopy Time: 14 minutes 18 seconds (693 mGy). COMPLICATIONS: None immediate. PROCEDURE: Informed consent was obtained from the patient and the patient's family following explanation of the procedure, risks, benefits and alternatives. The patient understands, agrees and consents for the procedure. All questions were addressed. A time out was performed prior to the initiation of the procedure. Maximal barrier sterile technique utilized including caps, mask, sterile gowns, sterile gloves, large sterile drape, hand hygiene, and  Betadine prep. The right femoral head was marked fluoroscopically. Under ultrasound guidance, the right common femoral artery was accessed with a micropuncture kit after the overlying soft tissues were anesthetized with 1% lidocaine. An ultrasound image was saved for documentation purposes. The micropuncture sheath was exchanged for a 5 [place] vascular sheath over a Bentson wire. A closure arteriogram was performed through the side of the sheath confirming access within the right common femoral artery. Over a Bentson wire, a Mickelson catheter was advanced to the level of the thoracic aorta where it was back bled and flushed. The catheter was then utilized to select the celiac artery and a selective celiac arteriogram was performed. Mickelson catheter was slowly retracted and utilized select the left gastric artery. Contrast injection confirmed the atretic appearance of this residual vessel. Next, a Fathom 14 micro wire was utilized to advance a regular Renegade microcatheter into the mid aspect of the remnant of this vessel. Contrast injection confirmed appropriate positioning. The residual left gastric artery was then subsequently embolized with multiple overlapping 3 and 2 mm interlock coils to near the level of its origin. Post embolization arteriogram images were obtained. Next, the [facility] catheter was utilized to select the left inferior phrenic artery. Again, the Fathom 14 micro wire was utilized to advance a regular Renegade microcatheter into the distal aspect of the left inferior phrenic artery, beyond the location of the pseudoaneurysm and active extravasation. The left inferior phrenic artery was then percutaneously coil embolized with multiple overlapping 3 and 4 mm diameter interlock coils across the area of pseudoaneurysm and active extravasation. Post embolization arteriogram images were obtained. Again, the Fathom 14 micro wire was utilized to advance a regular Renegade microcatheter into the  distal aspect of the splenic artery. Several splenic arteriograms were performed both from the distal and mid aspects of the splenic artery. Images were reviewed and the procedure was terminated. All wires, catheters and sheaths were removed from the patient. Hemostasis was achieved at the right groin access site with deployment of an Exoseal closure device and manual compression. A dressing was placed. The patient tolerated the above procedure well without immediate postprocedural complication. FINDINGS: Initial celiac arteriogram demonstrates a conventional branching pattern, however note diffuse vasospasm was noted throughout the celiac arterial system secondary to patient's hypovolemic state. Contrast injection demonstrates the origin of a residual, previously surgically ligated left gastric artery. Given concern for active extravasation from the gastric remnant as well as the diffuse vasospasm, this vessel was subsequently percutaneously coil embolized the level of the vessels origin. Post embolization arteriogram images demonstrate complete occlusion of this residual vessel. Contrast injection of the left inferior phrenic artery demonstrates active extravasation and pseudoaneurysm formation from its mid aspect, findings compatible with area of active extravasation demonstrated on preceding abdominal CT (coronal images 46 - 51, series 6). The inferior phrenic artery was percutaneously coil embolized  across the level of pseudoaneurysm and active extravasation. Post embolization arteriogram images demonstrate complete occlusion of the left inferior phrenic artery without residual extravasation. Dedicated splenic arteriograms were negative for discrete area of vessel irregularity or contrast extravasation. As such, embolization of the splenic artery was not performed at this time. IMPRESSION: 1. Technically successful percutaneous coil embolization of the left inferior phrenic artery for active extravasation. 2.  Technically successful prophylactic percutaneous coil embolization of the remnant left gastric artery. 3. No definite areas of active extravasation or vessel irregularity are identified arising from the splenic artery. As such, splenic embolization was not performed at this time. Above findings discussed with Dr. Janann Colonel (critical care) at the time of procedure completion. Electronically Signed   By: Sandi Mariscal M.D.   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 Please perform mesenteric arteriogram and potential percutaneous coil embolization. EXAM: 1. ULTRASOUND GUIDANCE FOR ARTERIAL ACCESS 2. SELECTIVE CELIAC ARTERIOGRAM 3. SELECTIVE LEFT GASTRIC REMNANT ARTERIOGRAM AND PERCUTANEOUS COIL EMBOLIZATION. 4. SELECTIVE LEFT INFERIOR PHRENIC ARTERIOGRAM AND PERCUTANEOUS COIL EMBOLIZATION. 5. SELECTIVE SPLENIC ARTERIOGRAM MEDICATIONS: None ANESTHESIA/SEDATION: Moderate (conscious) sedation was employed during this procedure. A total of Versed 5 mg and Fentanyl 100 mcg was administered intravenously. Moderate Sedation Time: 105 minutes. The patient's level of consciousness and vital signs were monitored continuously by radiology nursing throughout the procedure under my direct supervision. CONTRAST:  80 cc Isovue 300 FLUOROSCOPY TIME:  Fluoroscopy Time: 14 minutes 18 seconds (693 mGy). COMPLICATIONS: None immediate. PROCEDURE: Informed consent was obtained from the patient and the patient's family following explanation of the procedure, risks, benefits and alternatives. The patient understands, agrees and consents for the procedure. All questions were addressed. A time out was performed prior to the initiation of the procedure. Maximal barrier sterile technique utilized including caps, mask,  sterile gowns, sterile gloves, large sterile drape, hand hygiene, and Betadine prep. The right femoral head was marked fluoroscopically. Under ultrasound guidance, the right common femoral artery was accessed with a micropuncture kit after the overlying soft tissues were anesthetized with 1% lidocaine. An ultrasound image was saved for documentation purposes. The micropuncture sheath was exchanged for a 5 [place] vascular sheath over a Bentson wire. A closure arteriogram was performed through the side of the sheath confirming access within the right common femoral artery. Over a Bentson wire, a Mickelson catheter was advanced to the level of the thoracic aorta where it was back bled and flushed. The catheter was then utilized to select the celiac artery and a selective celiac arteriogram was performed. Mickelson catheter was slowly retracted and utilized select the left gastric artery. Contrast injection confirmed the atretic appearance of this residual vessel. Next, a Fathom 14 micro wire was utilized to advance a regular Renegade microcatheter into the mid aspect of the remnant of this vessel. Contrast injection confirmed appropriate positioning. The residual left gastric artery was then subsequently embolized with multiple overlapping 3 and 2 mm interlock coils to near the level of its origin. Post embolization arteriogram images were obtained. Next, the [facility] catheter was utilized to select the left inferior phrenic artery. Again, the Fathom 14 micro wire was utilized to advance a regular Renegade microcatheter into the distal aspect of the left inferior phrenic artery, beyond the location of the pseudoaneurysm and active extravasation. The left inferior  phrenic artery was then percutaneously coil embolized with multiple overlapping 3 and 4 mm diameter interlock coils across the area of pseudoaneurysm and active extravasation. Post embolization arteriogram images were obtained. Again, the Fathom 14 micro wire  was utilized to advance a regular Renegade microcatheter into the distal aspect of the splenic artery. Several splenic arteriograms were performed both from the distal and mid aspects of the splenic artery. Images were reviewed and the procedure was terminated. All wires, catheters and sheaths were removed from the patient. Hemostasis was achieved at the right groin access site with deployment of an Exoseal closure device and manual compression. A dressing was placed. The patient tolerated the above procedure well without immediate postprocedural complication. FINDINGS: Initial celiac arteriogram demonstrates a conventional branching pattern, however note diffuse vasospasm was noted throughout the celiac arterial system secondary to patient's hypovolemic state. Contrast injection demonstrates the origin of a residual, previously surgically ligated left gastric artery. Given concern for active extravasation from the gastric remnant as well as the diffuse vasospasm, this vessel was subsequently percutaneously coil embolized the level of the vessels origin. Post embolization arteriogram images demonstrate complete occlusion of this residual vessel. Contrast injection of the left inferior phrenic artery demonstrates active extravasation and pseudoaneurysm formation from its mid aspect, findings compatible with area of active extravasation demonstrated on preceding abdominal CT (coronal images 46 - 51, series 6). The inferior phrenic artery was percutaneously coil embolized across the level of pseudoaneurysm and active extravasation. Post embolization arteriogram images demonstrate complete occlusion of the left inferior phrenic artery without residual extravasation. Dedicated splenic arteriograms were negative for discrete area of vessel irregularity or contrast extravasation. As such, embolization of the splenic artery was not performed at this time. IMPRESSION: 1. Technically successful percutaneous coil embolization  of the left inferior phrenic artery for active extravasation. 2. Technically successful prophylactic percutaneous coil embolization of the remnant left gastric artery. 3. No definite areas of active extravasation or vessel irregularity are identified arising from the splenic artery. As such, splenic embolization was not performed at this time. Above findings discussed with Dr. Janann Colonel (critical care) at the time of procedure completion. Electronically Signed   By: Sandi Mariscal M.D.   On: 02/13/2017 08:20   Dg Abd Portable 1v  Result Date: 02/12/2017 CLINICAL DATA:  ABDOMINAL PAIN,HX GASTRECTOMY IN [place] 6-18 TO 6-30.hematemesis. History prior GI bleeding FLAT PLATE ABDOMEN PER DR CORNETT.PT FOR CT SCAN TODAY EXAM: PORTABLE ABDOMEN - 1 VIEW COMPARISON:  Chest x-ray 11/23/2011 FINDINGS: There is dilatation of numerous small bowel loops in the central abdomen. Surgical clips are identified in the right upper quadrant. No evidence for free intraperitoneal air on this supine views provided. Status post lumbar fusion. IMPRESSION: Findings consistent with partial or early small bowel obstruction. Electronically Signed   By: Nolon Nations M.D.   On: 02/12/2017 09:32   Dg Duanne Limerick W/water Sol Cm  Result Date: 02/13/2017 CLINICAL DATA:  Upper gastrointestinal hemorrhage. Extravasation from the phrenic artery treated by coil embolization yesterday. EXAM: WATER SOLUBLE UPPER GI SERIES TECHNIQUE: Single-column upper GI series was performed using water soluble contrast. CONTRAST:  Oral Isovue-300, approximately 100 ml. COMPARISON:  CT and interventional studies from yesterday. FLUOROSCOPY TIME:  Fluoroscopy Time: 54 seconds of low-dose pulsed fluoro Radiation Exposure Index (if provided by the fluoroscopic device): 13.7 mGy Number of Acquired Spot Images: 0 FINDINGS: The scout abdominal radiograph demonstrates embolization coils in the left upper quadrant. There is mild bowel distention and mild residual intraluminal contrast in  the right lower quadrant. The patient swallowed the enteric contrast without difficulty. 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 No aspiration was observed. The gastroesophageal junction is patent. There is adequate filling of the gastric pouch. Underlying mucosal thickening and a filling defect in the pouch are difficult to exclude. There is spontaneous drainage from the gastric pouch into the gastrojejunostomy. No extravasation demonstrated. IMPRESSION: 1. No evidence of obstruction or leak status post partial gastrectomy and bypass. 2. Limited mucosal assessment within the gastric pouch with possible mucosal thickening and/or intraluminal clot. Electronically Signed   By: Richardean Sale M.D.   On: 02/13/2017 10:17   [place] Guide Roadmapping  Result Date: 02/13/2017 INDICATION: Recent history of near total gastrectomy for gastroparesis at institution in [place], now with markedly abnormal CT scan with active extravasation within left upper abdominal quadrant. Please perform mesenteric arteriogram and potential percutaneous coil embolization. EXAM: 1. ULTRASOUND GUIDANCE FOR ARTERIAL ACCESS 2. SELECTIVE CELIAC ARTERIOGRAM 3. SELECTIVE LEFT GASTRIC REMNANT ARTERIOGRAM AND PERCUTANEOUS COIL EMBOLIZATION. 4. SELECTIVE LEFT INFERIOR PHRENIC ARTERIOGRAM AND PERCUTANEOUS COIL EMBOLIZATION. 5. SELECTIVE SPLENIC ARTERIOGRAM MEDICATIONS: None ANESTHESIA/SEDATION: Moderate (conscious) sedation was employed during this procedure. A total of Versed 5 mg and Fentanyl 100 mcg was administered intravenously. Moderate Sedation Time: 105 minutes. The patient's level of consciousness and vital signs were monitored continuously by radiology nursing throughout the procedure under my direct supervision. CONTRAST:  80 cc Isovue 300 FLUOROSCOPY TIME:  Fluoroscopy Time: 14 minutes 18 seconds (693 mGy). COMPLICATIONS: None immediate. PROCEDURE: Informed consent was obtained from the patient and the  patient's family following explanation of the procedure, risks, benefits and alternatives. The patient understands, agrees and consents for the procedure. All questions were addressed. A time out was performed prior to the initiation of the procedure. Maximal barrier sterile technique utilized including caps, mask, sterile gowns, sterile gloves, large sterile drape, hand hygiene, and Betadine prep. The right femoral head was marked fluoroscopically. Under ultrasound guidance, the right common femoral artery was accessed with a micropuncture kit after the overlying soft tissues were anesthetized with 1% lidocaine. An ultrasound image was saved for documentation purposes. The micropuncture sheath was exchanged for a 5 [place] vascular sheath over a Bentson wire. A closure arteriogram was performed through the side of the sheath confirming access within the right common femoral artery. Over a Bentson wire, a Mickelson catheter was advanced to the level of the thoracic aorta where it was back bled and flushed. The catheter was then utilized to select the celiac artery and a selective celiac arteriogram was performed. Mickelson catheter was slowly retracted and utilized select the left gastric artery. Contrast injection confirmed the atretic appearance of this residual vessel. Next, a Fathom 14 micro wire was utilized to advance a regular Renegade microcatheter into the mid aspect of the remnant of this vessel. Contrast injection confirmed appropriate positioning. The residual left gastric artery was then subsequently embolized with multiple overlapping 3 and 2 mm interlock coils to near the level of its origin. Post embolization arteriogram images were obtained. Next, the [facility] catheter was utilized to select the left inferior phrenic artery. Again, the Fathom 14 micro wire was utilized to advance a regular Renegade microcatheter into the distal aspect of the left inferior phrenic artery, beyond the location of the  pseudoaneurysm and active extravasation. The left inferior phrenic artery was then percutaneously coil embolized with multiple overlapping 3 and 4 mm diameter interlock coils across the area of pseudoaneurysm and active  extravasation. Post embolization arteriogram images were obtained. Again, the Fathom 14 micro wire was utilized to advance a regular Renegade microcatheter into the distal aspect of the splenic artery. Several splenic arteriograms were performed both from the distal and mid aspects of the splenic artery. Images were reviewed and the procedure was terminated. All wires, catheters and sheaths were removed from the patient. Hemostasis was achieved at the right groin access site with deployment of an Exoseal closure device and manual compression. A dressing was placed. The patient tolerated the above procedure well without immediate postprocedural complication. FINDINGS: Initial celiac arteriogram demonstrates a conventional branching pattern, however note diffuse vasospasm was noted throughout the celiac arterial system secondary to patient's hypovolemic state. Contrast injection demonstrates the origin of a residual, previously surgically ligated left gastric artery. Given concern for active extravasation from the gastric remnant as well as the diffuse vasospasm, this vessel was subsequently percutaneously coil embolized the level of the vessels origin. Post embolization arteriogram images demonstrate complete occlusion of this residual vessel. Contrast injection of the left inferior phrenic artery demonstrates active extravasation and pseudoaneurysm formation from its mid aspect, findings compatible with area of active extravasation demonstrated on preceding abdominal CT (coronal images 46 - 51, series 6). The inferior phrenic artery was percutaneously coil embolized across the level of pseudoaneurysm and active extravasation. Post embolization arteriogram images demonstrate complete occlusion of the  left inferior phrenic artery without residual extravasation. Dedicated splenic arteriograms were negative for discrete area of vessel irregularity or contrast extravasation. As such, embolization of the splenic artery was not performed at this time. IMPRESSION: 1. Technically successful percutaneous coil embolization of the left inferior phrenic artery for active extravasation. 2. Technically successful prophylactic percutaneous coil embolization of the remnant left gastric artery. 3. No definite areas of active extravasation or vessel irregularity are identified arising from the splenic artery. As such, splenic embolization was not performed at this time. Above findings discussed with Dr. Janann Colonel (critical care) at the time of procedure completion. Electronically Signed   By: Sandi Mariscal M.D.   On: 02/13/2017 08:20   STUDIES:  AXR 7/5 > ? Partial or early SBO. CT A / P 7/5 > splenic rupture 7/6 UGI series ordered by surgery and GI signed off.  SIGNIFICANT EVENTS  7/4 > admit. 7/5 > PCCM consult and transfer to the IC.  I reviewed abdominal CT myself, splenic bleed noted.  ASSESSMENT / PLAN:  Splenic bleed and coagulopathy  Plan: Continue to hold anti-coagulation No further FFP needed Continue PPI gtt. UGI series to be done today Continue empiric abx. Send blood cultures. Surgery and GI consulted, GI signed off, appreciate input from surgery.  Anemia - acute blood loss as well as hx of Fe deficiency (on intermittent IV therapy). Plan: Follow serial H/H. Transfuse for Hgb < 7.  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 Plan: No need for further FFP at this time H&H q6 with pRBC units on stand by.  Hx COPD. Plan: Start Albuterol, DuoNebs.  Transfer to [facility] and back to [facility] service with PCCM off 7/7.  Discussed with [facility]-MD and PCCM-NP.  Rush Farmer, M.D. [facility] Pulmonary/Critical Care Medicine. Pager: [phone]. After hours pager: [phone].  02/13/2017, 10:46  AM

## 2017-02-14 LAB — BASIC METABOLIC PANEL
Anion gap: 6 (ref 5–15)
BUN: 14 mg/dL (ref 6–20)
CALCIUM: 7.7 mg/dL — AB (ref 8.9–10.3)
CO2: 19 mmol/L — ABNORMAL LOW (ref 22–32)
CREATININE: 0.79 mg/dL (ref 0.61–1.24)
Chloride: 111 mmol/L (ref 101–111)
GFR calc Af Amer: >60 mL/min (ref >60)
GLUCOSE: 101 mg/dL — AB (ref 65–99)
Potassium: 3.7 mmol/L (ref 3.5–5.1)
SODIUM: 136 mmol/L (ref 135–145)

## 2017-02-14 LAB — CBC
HCT: 23.5 % — ABNORMAL LOW (ref 39.0–52.0)
HEMATOCRIT: 21 % — AB (ref 39.0–52.0)
Hemoglobin: 7.8 g/dL — ABNORMAL LOW (ref 13.0–17.0)
Hemoglobin: 6.9 g/dL — CL (ref 13.0–17.0)
MCH: 28 pg (ref 26.0–34.0)
MCH: 27.7 pg (ref 26.0–34.0)
MCHC: 33.2 g/dL (ref 30.0–36.0)
MCHC: 32.9 g/dL (ref 30.0–36.0)
MCV: 84.2 fL (ref 78.0–100.0)
MCV: 84.3 fL (ref 78.0–100.0)
PLATELETS: 271 10*3/uL (ref 150–400)
Platelets: 246 10*3/uL (ref 150–400)
RBC: 2.79 MIL/uL — ABNORMAL LOW (ref 4.22–5.81)
RBC: 2.49 MIL/uL — ABNORMAL LOW (ref 4.22–5.81)
RDW: 16.8 % — AB (ref 11.5–15.5)
RDW: 16.7 % — AB (ref 11.5–15.5)
WBC: 16.5 10*3/uL — ABNORMAL HIGH (ref 4.0–10.5)
WBC: 15.5 10*3/uL — AB (ref 4.0–10.5)

## 2017-02-14 LAB — GLUCOSE, CAPILLARY
Glucose-Capillary: 100 mg/dL — ABNORMAL HIGH (ref 65–99)
Glucose-Capillary: 91 mg/dL (ref 65–99)
Glucose-Capillary: 79 mg/dL (ref 65–99)

## 2017-02-14 LAB — MAGNESIUM: MAGNESIUM: 1.8 mg/dL (ref 1.7–2.4)

## 2017-02-14 LAB — PREPARE RBC (CROSSMATCH)

## 2017-02-14 LAB — PROTIME-INR
INR: 1.49
PROTHROMBIN TIME: 18.2 s — AB (ref 11.4–15.2)

## 2017-02-14 LAB — PHOSPHORUS: Phosphorus: 2.4 mg/dL — ABNORMAL LOW (ref 2.5–4.6)

## 2017-02-14 MED ORDER — POLYETHYLENE GLYCOL 3350 17 G PO PACK
17.0000 g | PACK | Freq: Every day | ORAL | Status: DC
  Administered 2017-02-14 – 2017-02-18 (×4): 17 g via ORAL
  Filled 2017-02-14 (×4): qty 1

## 2017-02-14 MED ORDER — PROMETHAZINE HCL 25 MG/ML IJ SOLN
12.5000 mg | Freq: Four times a day (QID) | INTRAMUSCULAR | Status: DC | PRN
  Administered 2017-02-14 – 2017-02-17 (×8): 12.5 mg via INTRAVENOUS
  Filled 2017-02-14 (×10): qty 1

## 2017-02-14 MED ORDER — ENSURE ENLIVE PO LIQD
237.0000 mL | Freq: Two times a day (BID) | ORAL | Status: DC
Start: 2017-02-14 — End: 2017-02-17
  Administered 2017-02-14 – 2017-02-17 (×6): 237 mL via ORAL

## 2017-02-14 MED ORDER — SODIUM CHLORIDE 0.9 % IV SOLN
Freq: Once | INTRAVENOUS | Status: AC
  Administered 2017-02-14: 17:00:00 via INTRAVENOUS

## 2017-02-14 MED ORDER — GABAPENTIN 300 MG PO CAPS
300.0000 mg | ORAL_CAPSULE | Freq: Every day | ORAL | Status: DC
  Administered 2017-02-14 – 2017-02-18 (×5): 300 mg via ORAL
  Filled 2017-02-14 (×3): qty 1
  Filled 2017-02-14: qty 3
  Filled 2017-02-14: qty 1

## 2017-02-14 MED ORDER — HYDROMORPHONE HCL 1 MG/ML IJ SOLN
1.0000 mg | INTRAMUSCULAR | Status: DC | PRN
  Administered 2017-02-14 – 2017-02-18 (×35): 1 mg via INTRAVENOUS
  Filled 2017-02-14 (×34): qty 1

## 2017-02-14 MED ORDER — SENNOSIDES-DOCUSATE SODIUM 8.6-50 MG PO TABS
1.0000 | ORAL_TABLET | Freq: Two times a day (BID) | ORAL | Status: DC
  Administered 2017-02-14 – 2017-02-18 (×6): 1 via ORAL
  Filled 2017-02-14 (×6): qty 1

## 2017-02-14 MED ORDER — ALBUTEROL SULFATE (2.5 MG/3ML) 0.083% IN NEBU: 2.5000 mg | INHALATION_SOLUTION | RESPIRATORY_TRACT | Status: DC | PRN

## 2017-02-14 MED ORDER — CYCLOBENZAPRINE HCL 10 MG PO TABS
5.0000 mg | ORAL_TABLET | Freq: Three times a day (TID) | ORAL | Status: DC | PRN
  Administered 2017-02-15 – 2017-02-18 (×4): 5 mg via ORAL
  Filled 2017-02-14 (×4): qty 1

## 2017-02-14 MED ORDER — ACETAMINOPHEN 325 MG PO TABS: 650.0000 mg | ORAL_TABLET | Freq: Four times a day (QID) | ORAL | Status: DC | PRN

## 2017-02-14 MED ORDER — LORAZEPAM 0.5 MG PO TABS
0.5000 mg | ORAL_TABLET | Freq: Three times a day (TID) | ORAL | Status: DC
  Administered 2017-02-14 – 2017-02-18 (×11): 0.5 mg via ORAL
  Filled 2017-02-14 (×11): qty 1

## 2017-02-14 MED ORDER — SODIUM CHLORIDE 0.9% FLUSH
10.0000 mL | Freq: Two times a day (BID) | INTRAVENOUS | Status: DC
Start: 2017-02-14 — End: 2017-02-18
  Administered 2017-02-14 – 2017-02-17 (×6): 10 mL
  Administered 2017-02-17: 20 mL
  Administered 2017-02-18: 10 mL

## 2017-02-14 NOTE — Progress Notes (Signed)
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  He had done well post op until 7/4 when he had large amount of hematemesis and reported to the ED.    In the ED he was guaiac positive, BP soft at 97/64, Hgb 6.8, PLT 539.  He was transfused 2u PRBC and started on a PPI gtt.  Significant Events: 7/4 admit 7/5 transfer to ICU - L inferior phrenic artery embolization in IR   Subjective: C/o ongoing LUQ severe cramping pain w/ associated severe nausea.  Denies vomiting, sob, or chest pain.  Tolerating liquids w/o trouble.    Assessment & Plan:  Rupture and acute extravasation from the left inferior phrenic artery tx w/ embolization in IR 7/6 - appears to have stabilized   Intraabdominal hematoma due to above Empiric abx continues for now - tx pain and nausea   Acute blood loss anemia S/p 5 units PRBC thus far - appears to be holding steady for now - follow   Recent Labs Lab 02/13/17 0352 02/13/17 1220 02/13/17 1556 02/13/17 2200 02/14/17 0421  HGB 9.5* 8.9* 9.0* 8.1* 7.8*    Coumadin induced coagulopathy INR corrected to normal   Hx severe reflux s/p subtotal gastrectomy June 2018 No obstruction or leak per UGI 7/6  L atrial appendage thrombus  Not able to anticoagulate presently for obvious reasons   COPD Well compensated   Severe malnutrition in context of chronic illness  MRSA swab +  DVT prophylaxis: SCDs Code Status: FULL CODE Family Communication: no family present at time of exam  Disposition Plan: transfer to SDU - begin PT/OT   Consultants:  Gen Surgery IR Critical Care  Antimicrobials:  Zosyn 7/5 > Vanc 7/5 > Anidulafungin 7/5 >   Objective: Blood  pressure 131/82, pulse (!) 115, temperature 99.1 F (37.3 C), temperature source Oral, resp. rate (!) 22, height 5\' 5"  (1.651 m), weight 52.1 kg (114 lb 13.8 oz), SpO2 (!) 80 %.  Intake/Output Summary (Last 24 hours) at 02/14/17 0904 Last data filed at 02/14/17 0900  Gross per 24 hour  Intake             3135 ml  Output             1975 ml  Net             1160 ml   Filed Weights   02/12/17 0155  Weight: 52.1 kg (114 lb 13.8 oz)    Examination: General: No acute respiratory distress Lungs: Clear to auscultation bilaterally without wheezes or crackles Cardiovascular: Regular rate and rhythm without murmur gallop or rub normal S1 and S2 Abdomen: tender to palpation LUQ - no mass - no rebound - bs+ Extremities: No significant cyanosis, clubbing, or edema bilateral lower extremities  CBC:  Recent Labs Lab 02/11/17 1950  02/12/17 0646 02/12/17 1029  02/13/17 0352 02/13/17 1220 02/13/17 1556 02/13/17 2200 02/14/17 0421  WBC 10.7*  --  15.6* 26.6*  --   --   --   --   --  16.5*  NEUTROABS 8.7*  --   --   --   --   --   --   --   --   --  HGB 6.8*  < > 8.6* 5.7*  < > 9.5* 8.9* 9.0* 8.1* 7.8*  HCT 22.3*  < > 26.9* 18.0*  < > 28.7* 26.4* 27.2* 24.1* 23.5*  MCV 84.8  --  87.1 85.7  --   --   --   --   --  84.2  PLT 539*  --  662* 563*  --   --   --   --   --  271  < > = values in this interval not displayed.   Basic Metabolic Panel:  Recent Labs Lab 02/11/17 1950 02/11/17 2003 02/12/17 0646 02/14/17 0421  NA 136 139 137 136  K 4.0 4.1 4.1 3.7  CL 108 108 111 111  CO2 19*  --  18* 19*  GLUCOSE 139* 134* 128* 101*  BUN 22* 23* 24* 14  CREATININE 1.01 0.90 0.80 0.79  CALCIUM 8.1*  --  7.9* 7.7*  MG  --   --   --  1.8  PHOS  --   --   --  2.4*   GFR: Estimated Creatinine Clearance: 75.1 mL/min (by C-G formula based on SCr of 0.79 mg/dL).  Liver Function Tests:  Recent Labs Lab 02/11/17 1950  AST 11*  ALT 8*  ALKPHOS 67  BILITOT 0.3  PROT 5.2*  ALBUMIN 2.2*     Coagulation Profile:  Recent Labs Lab 02/11/17 1950 02/12/17 0646 02/13/17 0352 02/14/17 0421  INR 1.23 1.26 1.39 1.49    HbA1C: Hgb A1c MFr Bld  Date/Time Value Ref Range Status  03/08/2007 07:37 AM   Final   5.1 (NOTE)   The ADA recommends the following therapeutic goals for glycemic   control related to Hgb A1C measurement:   Goal of Therapy:   < 7.0% Hgb A1C   Action Suggested:  > 8.0% Hgb A1C   Ref:  Diabetes Care, 22, Suppl. 1, 1999    CBG:  Recent Labs Lab 02/12/17 2341 02/13/17 0622 02/13/17 1202 02/13/17 2344 02/14/17 0632  GLUCAP 202* 144* 121* 115* 100*    Recent Results (from the past 240 hour(s))  MRSA PCR Screening     Status: Abnormal   Collection Time: 02/12/17  1:58 AM  Result Value Ref Range Status   MRSA by PCR POSITIVE (A) NEGATIVE Final    Comment:        The GeneXpert MRSA Assay (FDA approved for NASAL specimens only), is one component of a comprehensive MRSA colonization surveillance program. It is not intended to diagnose MRSA infection nor to guide or monitor treatment for MRSA infections. RESULT CALLED TO, READ BACK BY AND VERIFIED WITH: PETTIFORD,A RN [phone] 02/12/17 MITCHELL,L      Scheduled Meds: . feeding supplement (ENSURE ENLIVE)  237 mL Oral BID BM  . ipratropium-albuterol  3 mL Nebulization TID  . metoCLOPramide (REGLAN) injection  5 mg Intravenous Q6H  . mupirocin ointment  1 application Nasal BID  . polyethylene glycol  17 g Oral Daily     LOS: 3 days   Lonia Blood, MD [facility] Office  [phone] Pager - Text Page per Loretha Stapler as per below:  On-Call/Text Page:      [email or web address]      password [email or web address]  If 7PM-7AM, please contact night-coverage [email or web address] Password [email or web address] 02/14/2017, 9:04 AM

## 2017-02-14 NOTE — Progress Notes (Signed)
[facility] Progress Note     Subjective: CC: abdominal pain Pain is still the worst in the LLQ, but improved from yesterday. +flatus, no BM. Still having some nausea. Has been taking clears.   Objective: Vital signs in last 24 hours: Temp:  [97.8 F (36.6 C)-99.1 F (37.3 C)] 99.1 F (37.3 C) (07/07 0753) Pulse Rate:  [89-107] 95 (07/07 0500) Resp:  [11-34] 21 (07/07 0500) BP: (122-150)/(74-92) 130/78 (07/07 0500) SpO2:  [97 %-100 %] 100 % (07/07 0500) Last BM Date: 02/12/17  Intake/Output from previous day: 07/06 0701 - 07/07 0700 In: 2790.8 [P.O.:240; I.V.:1770.8; IV Piggyback:780] Out: 2125 [Urine:2125] Intake/Output this shift: No intake/output data recorded.  PE: Gen:  Alert, NAD, pleasant Card:  Regular rhythm, tachycardic, no M/G/R appreciated Pulm:  Normal effort, clear to auscultation bilaterally Abd: Soft, TTP of LLQ, non-distended, bowel sounds present, midline incision C/D/I Skin: warm and dry, no rashes  Psych: A&Ox3   Lab Results:   Recent Labs  02/12/17 1029  02/13/17 2200 02/14/17 0421  WBC 26.6*  --   --  16.5*  HGB 5.7*  < > 8.1* 7.8*  HCT 18.0*  < > 24.1* 23.5*  PLT 563*  --   --  271  < > = values in this interval not displayed. BMET  Recent Labs  02/12/17 0646 02/14/17 0421  NA 137 136  K 4.1 3.7  CL 111 111  CO2 18* 19*  GLUCOSE 128* 101*  BUN 24* 14  CREATININE 0.80 0.79  CALCIUM 7.9* 7.7*   PT/INR  Recent Labs  02/13/17 0352 02/14/17 0421  LABPROT 17.2* 18.2*  INR 1.39 1.49   CMP     Component Value Date/Time   NA 136 02/14/2017 0421   K 3.7 02/14/2017 0421   CL 111 02/14/2017 0421   CO2 19 (L) 02/14/2017 0421   GLUCOSE 101 (H) 02/14/2017 0421   BUN 14 02/14/2017 0421   CREATININE 0.79 02/14/2017 0421   CALCIUM 7.7 (L) 02/14/2017 0421   PROT 5.2 (L) 02/11/2017 1950   ALBUMIN 2.2 (L) 02/11/2017 1950   AST 11 (L) 02/11/2017 1950   ALT 8 (L) 02/11/2017 1950   ALKPHOS 67 02/11/2017 1950   BILITOT  0.3 02/11/2017 1950   GFRNONAA >60 02/14/2017 0421   GFRAA >60 02/14/2017 0421     Studies/Results: Ct Abdomen Pelvis W Contrast  Addendum Date: 02/13/2017   ADDENDUM REPORT: 02/13/2017 09:36 ADDENDUM: Subsequent to the digital angiogram and discussion with Dr. Pascal Lux of interventional radiology, CT findings are felt to be related to rupture and acute extravasation from the left inferior phrenic artery. The appearance of the spleen is likely related to mass effect and hypotension, rather than splenic rupture. The pseudoaneurysm formation within the left inferior phrenic artery may be secondary to gastric surgical dehiscence with extravasation of gastric contents into the left upper quadrant. Electronically Signed   By: Abigail Miyamoto M.D.   On: 02/13/2017 09:36   Result Date: 02/13/2017 CLINICAL DATA:  Left-sided pain and hemoptysis since yesterday. Gastroparesis surgery 2 weeks ago. GI bleeding. EXAM: CT ABDOMEN AND PELVIS WITH CONTRAST TECHNIQUE: Multidetector CT imaging of the abdomen and pelvis was performed using the standard protocol following bolus administration of intravenous contrast. CONTRAST:  128m ISOVUE-300 IOPAMIDOL (ISOVUE-300) INJECTION 61% COMPARISON:  Plain films of earlier today.  No prior CT. FINDINGS: Lower chest: Minimal motion degradation at the lung bases. Subsegmental atelectasis at the left lung base. Normal heart size with right coronary artery atherosclerosis. Trace  right and small left pleural effusion. Hepatobiliary: Normal liver. Cholecystectomy, without biliary ductal dilatation. Pancreas: Normal, without mass or ductal dilatation. Pancreas is displaced anteriorly by left upper quadrant blood. Spleen: Splenic rupture, as evidenced by perisplenic hemorrhage and areas of hypoattenuation throughout the spleen. Active extravasation of large volume left upper quadrant hemorrhage, likely from the splenic artery. Example image 16/series 3 and on delayed images. Adrenals/Urinary  Tract: Normal adrenal glands. Heterogeneous enhancement involves both kidneys, most apparent on delayed images. No hydronephrosis. Normal urinary bladder. Stomach/Bowel: Surgical changes about the proximal stomach. The stomach is difficult to delineate from the surrounding hemorrhage. Suspicion of direct extension of blood into the stomach, including on image 17/series 3. No gastric obstruction. Normal colon and terminal ileum. Normal small bowel caliber. Prior enterotomy. No free intraperitoneal air. Vascular/Lymphatic: Aortic and branch vessel atherosclerosis. No abdominopelvic adenopathy. Reproductive: Normal prostate. Other: Relatively small volume pelvic fluid with mild complexity, likely hemorrhage. Moderate volume intraperitoneal and retroperitoneal hemorrhage throughout the abdomen. Fluid containing periumbilical hernia. Musculoskeletal: Bilateral femoral head avascular necrosis. Posterior fixation at L4-5. IMPRESSION: 1. Splenic rupture with large volume active extravasation throughout the left upper quadrant, likely arising from distal splenic artery branches. 2. Apparent communication of hemorrhage into the stomach. 3. Bilateral heterogeneous renal enhancement may relate to pyelonephritis. 4. Tiny bilateral pleural effusions. 5. Bilateral femoral head avascular necrosis. Critical test results telephoned to.Dr. Nelda Marseille. at the time of interpretation at 2:48 p.m.on 02/12/2017. Electronically Signed: By: Abigail Miyamoto M.D. On: 02/12/2017 14:57   Ir Angiogram Visceral Selective  Result Date: 02/13/2017 INDICATION: Recent history of near total gastrectomy for gastroparesis at institution in [place], now with markedly abnormal CT scan with active extravasation within left upper abdominal quadrant. Please perform mesenteric arteriogram and potential percutaneous coil embolization. EXAM: 1. ULTRASOUND GUIDANCE FOR ARTERIAL ACCESS 2. SELECTIVE CELIAC ARTERIOGRAM 3. SELECTIVE LEFT GASTRIC REMNANT ARTERIOGRAM AND  PERCUTANEOUS COIL EMBOLIZATION. 4. SELECTIVE LEFT INFERIOR PHRENIC ARTERIOGRAM AND PERCUTANEOUS COIL EMBOLIZATION. 5. SELECTIVE SPLENIC ARTERIOGRAM MEDICATIONS: None ANESTHESIA/SEDATION: Moderate (conscious) sedation was employed during this procedure. A total of Versed 5 mg and Fentanyl 100 mcg was administered intravenously. Moderate Sedation Time: 105 minutes. The patient's level of consciousness and vital signs were monitored continuously by radiology nursing throughout the procedure under my direct supervision. CONTRAST:  80 cc Isovue 300 FLUOROSCOPY TIME:  Fluoroscopy Time: 14 minutes 18 seconds (693 mGy). COMPLICATIONS: None immediate. PROCEDURE: Informed consent was obtained from the patient and the patient's family following explanation of the procedure, risks, benefits and alternatives. The patient understands, agrees and consents for the procedure. All questions were addressed. A time out was performed prior to the initiation of the procedure. Maximal barrier sterile technique utilized including caps, mask, sterile gowns, sterile gloves, large sterile drape, hand hygiene, and Betadine prep. The right femoral head was marked fluoroscopically. Under ultrasound guidance, the right common femoral artery was accessed with a micropuncture kit after the overlying soft tissues were anesthetized with 1% lidocaine. An ultrasound image was saved for documentation purposes. The micropuncture sheath was exchanged for a 5 [place] vascular sheath over a Bentson wire. A closure arteriogram was performed through the side of the sheath confirming access within the right common femoral artery. Over a Bentson wire, a Mickelson catheter was advanced to the level of the thoracic aorta where it was back bled and flushed. The catheter was then utilized to select the celiac artery and a selective celiac arteriogram was performed. Mickelson catheter was slowly retracted and utilized select the left gastric artery.  Contrast  injection confirmed the atretic appearance of this residual vessel. Next, a Fathom 14 micro wire was utilized to advance a regular Renegade microcatheter into the mid aspect of the remnant of this vessel. Contrast injection confirmed appropriate positioning. The residual left gastric artery was then subsequently embolized with multiple overlapping 3 and 2 mm interlock coils to near the level of its origin. Post embolization arteriogram images were obtained. Next, the [facility] catheter was utilized to select the left inferior phrenic artery. Again, the Fathom 14 micro wire was utilized to advance a regular Renegade microcatheter into the distal aspect of the left inferior phrenic artery, beyond the location of the pseudoaneurysm and active extravasation. The left inferior phrenic artery was then percutaneously coil embolized with multiple overlapping 3 and 4 mm diameter interlock coils across the area of pseudoaneurysm and active extravasation. Post embolization arteriogram images were obtained. Again, the Fathom 14 micro wire was utilized to advance a regular Renegade microcatheter into the distal aspect of the splenic artery. Several splenic arteriograms were performed both from the distal and mid aspects of the splenic artery. Images were reviewed and the procedure was terminated. All wires, catheters and sheaths were removed from the patient. Hemostasis was achieved at the right groin access site with deployment of an Exoseal closure device and manual compression. A dressing was placed. The patient tolerated the above procedure well without immediate postprocedural complication. FINDINGS: Initial celiac arteriogram demonstrates a conventional branching pattern, however note diffuse vasospasm was noted throughout the celiac arterial system secondary to patient's hypovolemic state. Contrast injection demonstrates the origin of a residual, previously surgically ligated left gastric artery. Given concern for active  extravasation from the gastric remnant as well as the diffuse vasospasm, this vessel was subsequently percutaneously coil embolized the level of the vessels origin. Post embolization arteriogram images demonstrate complete occlusion of this residual vessel. Contrast injection of the left inferior phrenic artery demonstrates active extravasation and pseudoaneurysm formation from its mid aspect, findings compatible with area of active extravasation demonstrated on preceding abdominal CT (coronal images 46 - 51, series 6). The inferior phrenic artery was percutaneously coil embolized across the level of pseudoaneurysm and active extravasation. Post embolization arteriogram images demonstrate complete occlusion of the left inferior phrenic artery without residual extravasation. Dedicated splenic arteriograms were negative for discrete area of vessel irregularity or contrast extravasation. As such, embolization of the splenic artery was not performed at this time. IMPRESSION: 1. Technically successful percutaneous coil embolization of the left inferior phrenic artery for active extravasation. 2. Technically successful prophylactic percutaneous coil embolization of the remnant left gastric artery. 3. No definite areas of active extravasation or vessel irregularity are identified arising from the splenic artery. As such, splenic embolization was not performed at this time. Above findings discussed with Dr. Janann Colonel (critical care) at the time of procedure completion. Electronically Signed   By: Sandi Mariscal M.D.   On: 02/13/2017 08:20   Ir Angiogram Selective Each Additional Vessel  Result Date: 02/13/2017 INDICATION: Recent history of near total gastrectomy for gastroparesis at institution in [place], now with markedly abnormal CT scan with active extravasation within left upper abdominal quadrant. Please perform mesenteric arteriogram and potential percutaneous coil embolization. EXAM: 1. ULTRASOUND GUIDANCE FOR ARTERIAL  ACCESS 2. SELECTIVE CELIAC ARTERIOGRAM 3. SELECTIVE LEFT GASTRIC REMNANT ARTERIOGRAM AND PERCUTANEOUS COIL EMBOLIZATION. 4. SELECTIVE LEFT INFERIOR PHRENIC ARTERIOGRAM AND PERCUTANEOUS COIL EMBOLIZATION. 5. SELECTIVE SPLENIC ARTERIOGRAM MEDICATIONS: None ANESTHESIA/SEDATION: Moderate (conscious) sedation was employed during this procedure. A total of Versed 5  mg and Fentanyl 100 mcg was administered intravenously. Moderate Sedation Time: 105 minutes. The patient's level of consciousness and vital signs were monitored continuously by radiology nursing throughout the procedure under my direct supervision. CONTRAST:  80 cc Isovue 300 FLUOROSCOPY TIME:  Fluoroscopy Time: 14 minutes 18 seconds (693 mGy). COMPLICATIONS: None immediate. PROCEDURE: Informed consent was obtained from the patient and the patient's family following explanation of the procedure, risks, benefits and alternatives. The patient understands, agrees and consents for the procedure. All questions were addressed. A time out was performed prior to the initiation of the procedure. Maximal barrier sterile technique utilized including caps, mask, sterile gowns, sterile gloves, large sterile drape, hand hygiene, and Betadine prep. The right femoral head was marked fluoroscopically. Under ultrasound guidance, the right common femoral artery was accessed with a micropuncture kit after the overlying soft tissues were anesthetized with 1% lidocaine. An ultrasound image was saved for documentation purposes. The micropuncture sheath was exchanged for a 5 [place] vascular sheath over a Bentson wire. A closure arteriogram was performed through the side of the sheath confirming access within the right common femoral artery. Over a Bentson wire, a Mickelson catheter was advanced to the level of the thoracic aorta where it was back bled and flushed. The catheter was then utilized to select the celiac artery and a selective celiac arteriogram was performed. Mickelson  catheter was slowly retracted and utilized select the left gastric artery. Contrast injection confirmed the atretic appearance of this residual vessel. Next, a Fathom 14 micro wire was utilized to advance a regular Renegade microcatheter into the mid aspect of the remnant of this vessel. Contrast injection confirmed appropriate positioning. The residual left gastric artery was then subsequently embolized with multiple overlapping 3 and 2 mm interlock coils to near the level of its origin. Post embolization arteriogram images were obtained. Next, the [facility] catheter was utilized to select the left inferior phrenic artery. Again, the Fathom 14 micro wire was utilized to advance a regular Renegade microcatheter into the distal aspect of the left inferior phrenic artery, beyond the location of the pseudoaneurysm and active extravasation. The left inferior phrenic artery was then percutaneously coil embolized with multiple overlapping 3 and 4 mm diameter interlock coils across the area of pseudoaneurysm and active extravasation. Post embolization arteriogram images were obtained. Again, the Fathom 14 micro wire was utilized to advance a regular Renegade microcatheter into the distal aspect of the splenic artery. Several splenic arteriograms were performed both from the distal and mid aspects of the splenic artery. Images were reviewed and the procedure was terminated. All wires, catheters and sheaths were removed from the patient. Hemostasis was achieved at the right groin access site with deployment of an Exoseal closure device and manual compression. A dressing was placed. The patient tolerated the above procedure well without immediate postprocedural complication. FINDINGS: Initial celiac arteriogram demonstrates a conventional branching pattern, however note diffuse vasospasm was noted throughout the celiac arterial system secondary to patient's hypovolemic state. Contrast injection demonstrates the origin of a  residual, previously surgically ligated left gastric artery. Given concern for active extravasation from the gastric remnant as well as the diffuse vasospasm, this vessel was subsequently percutaneously coil embolized the level of the vessels origin. Post embolization arteriogram images demonstrate complete occlusion of this residual vessel. Contrast injection of the left inferior phrenic artery demonstrates active extravasation and pseudoaneurysm formation from its mid aspect, findings compatible with area of active extravasation demonstrated on preceding abdominal CT (coronal images 46 - 51,  series 6). The inferior phrenic artery was percutaneously coil embolized across the level of pseudoaneurysm and active extravasation. Post embolization arteriogram images demonstrate complete occlusion of the left inferior phrenic artery without residual extravasation. Dedicated splenic arteriograms were negative for discrete area of vessel irregularity or contrast extravasation. As such, embolization of the splenic artery was not performed at this time. IMPRESSION: 1. Technically successful percutaneous coil embolization of the left inferior phrenic artery for active extravasation. 2. Technically successful prophylactic percutaneous coil embolization of the remnant left gastric artery. 3. No definite areas of active extravasation or vessel irregularity are identified arising from the splenic artery. As such, splenic embolization was not performed at this time. Above findings discussed with Dr. Janann Colonel (critical care) at the time of procedure completion. Electronically Signed   By: Sandi Mariscal M.D.   On: 02/13/2017 08:20   Ir Angiogram Selective Each Additional Vessel  Result Date: 02/13/2017 INDICATION: Recent history of near total gastrectomy for gastroparesis at institution in [place], now with markedly abnormal CT scan with active extravasation within left upper abdominal quadrant. Please perform mesenteric arteriogram and  potential percutaneous coil embolization. EXAM: 1. ULTRASOUND GUIDANCE FOR ARTERIAL ACCESS 2. SELECTIVE CELIAC ARTERIOGRAM 3. SELECTIVE LEFT GASTRIC REMNANT ARTERIOGRAM AND PERCUTANEOUS COIL EMBOLIZATION. 4. SELECTIVE LEFT INFERIOR PHRENIC ARTERIOGRAM AND PERCUTANEOUS COIL EMBOLIZATION. 5. SELECTIVE SPLENIC ARTERIOGRAM MEDICATIONS: None ANESTHESIA/SEDATION: Moderate (conscious) sedation was employed during this procedure. A total of Versed 5 mg and Fentanyl 100 mcg was administered intravenously. Moderate Sedation Time: 105 minutes. The patient's level of consciousness and vital signs were monitored continuously by radiology nursing throughout the procedure under my direct supervision. CONTRAST:  80 cc Isovue 300 FLUOROSCOPY TIME:  Fluoroscopy Time: 14 minutes 18 seconds (693 mGy). COMPLICATIONS: None immediate. PROCEDURE: Informed consent was obtained from the patient and the patient's family following explanation of the procedure, risks, benefits and alternatives. The patient understands, agrees and consents for the procedure. All questions were addressed. A time out was performed prior to the initiation of the procedure. Maximal barrier sterile technique utilized including caps, mask, sterile gowns, sterile gloves, large sterile drape, hand hygiene, and Betadine prep. The right femoral head was marked fluoroscopically. Under ultrasound guidance, the right common femoral artery was accessed with a micropuncture kit after the overlying soft tissues were anesthetized with 1% lidocaine. An ultrasound image was saved for documentation purposes. The micropuncture sheath was exchanged for a 5 [place] vascular sheath over a Bentson wire. A closure arteriogram was performed through the side of the sheath confirming access within the right common femoral artery. Over a Bentson wire, a Mickelson catheter was advanced to the level of the thoracic aorta where it was back bled and flushed. The catheter was then utilized to  select the celiac artery and a selective celiac arteriogram was performed. Mickelson catheter was slowly retracted and utilized select the left gastric artery. Contrast injection confirmed the atretic appearance of this residual vessel. Next, a Fathom 14 micro wire was utilized to advance a regular Renegade microcatheter into the mid aspect of the remnant of this vessel. Contrast injection confirmed appropriate positioning. The residual left gastric artery was then subsequently embolized with multiple overlapping 3 and 2 mm interlock coils to near the level of its origin. Post embolization arteriogram images were obtained. Next, the [facility] catheter was utilized to select the left inferior phrenic artery. Again, the Fathom 14 micro wire was utilized to advance a regular Renegade microcatheter into the distal aspect of the left inferior phrenic artery, beyond the  location of the pseudoaneurysm and active extravasation. The left inferior phrenic artery was then percutaneously coil embolized with multiple overlapping 3 and 4 mm diameter interlock coils across the area of pseudoaneurysm and active extravasation. Post embolization arteriogram images were obtained. Again, the Fathom 14 micro wire was utilized to advance a regular Renegade microcatheter into the distal aspect of the splenic artery. Several splenic arteriograms were performed both from the distal and mid aspects of the splenic artery. Images were reviewed and the procedure was terminated. All wires, catheters and sheaths were removed from the patient. Hemostasis was achieved at the right groin access site with deployment of an Exoseal closure device and manual compression. A dressing was placed. The patient tolerated the above procedure well without immediate postprocedural complication. FINDINGS: Initial celiac arteriogram demonstrates a conventional branching pattern, however note diffuse vasospasm was noted throughout the celiac arterial system  secondary to patient's hypovolemic state. Contrast injection demonstrates the origin of a residual, previously surgically ligated left gastric artery. Given concern for active extravasation from the gastric remnant as well as the diffuse vasospasm, this vessel was subsequently percutaneously coil embolized the level of the vessels origin. Post embolization arteriogram images demonstrate complete occlusion of this residual vessel. Contrast injection of the left inferior phrenic artery demonstrates active extravasation and pseudoaneurysm formation from its mid aspect, findings compatible with area of active extravasation demonstrated on preceding abdominal CT (coronal images 46 - 51, series 6). The inferior phrenic artery was percutaneously coil embolized across the level of pseudoaneurysm and active extravasation. Post embolization arteriogram images demonstrate complete occlusion of the left inferior phrenic artery without residual extravasation. Dedicated splenic arteriograms were negative for discrete area of vessel irregularity or contrast extravasation. As such, embolization of the splenic artery was not performed at this time. IMPRESSION: 1. Technically successful percutaneous coil embolization of the left inferior phrenic artery for active extravasation. 2. Technically successful prophylactic percutaneous coil embolization of the remnant left gastric artery. 3. No definite areas of active extravasation or vessel irregularity are identified arising from the splenic artery. As such, splenic embolization was not performed at this time. Above findings discussed with Dr. Janann Colonel (critical care) at the time of procedure completion. Electronically Signed   By: Sandi Mariscal M.D.   On: 02/13/2017 08:20   Ir Angiogram Selective Each Additional Vessel  Result Date: 02/13/2017 INDICATION: Recent history of near total gastrectomy for gastroparesis at institution in [place], now with markedly abnormal CT scan with active  extravasation within left upper abdominal quadrant. Please perform mesenteric arteriogram and potential percutaneous coil embolization. EXAM: 1. ULTRASOUND GUIDANCE FOR ARTERIAL ACCESS 2. SELECTIVE CELIAC ARTERIOGRAM 3. SELECTIVE LEFT GASTRIC REMNANT ARTERIOGRAM AND PERCUTANEOUS COIL EMBOLIZATION. 4. SELECTIVE LEFT INFERIOR PHRENIC ARTERIOGRAM AND PERCUTANEOUS COIL EMBOLIZATION. 5. SELECTIVE SPLENIC ARTERIOGRAM MEDICATIONS: None ANESTHESIA/SEDATION: Moderate (conscious) sedation was employed during this procedure. A total of Versed 5 mg and Fentanyl 100 mcg was administered intravenously. Moderate Sedation Time: 105 minutes. The patient's level of consciousness and vital signs were monitored continuously by radiology nursing throughout the procedure under my direct supervision. CONTRAST:  80 cc Isovue 300 FLUOROSCOPY TIME:  Fluoroscopy Time: 14 minutes 18 seconds (693 mGy). COMPLICATIONS: None immediate. PROCEDURE: Informed consent was obtained from the patient and the patient's family following explanation of the procedure, risks, benefits and alternatives. The patient understands, agrees and consents for the procedure. All questions were addressed. A time out was performed prior to the initiation of the procedure. Maximal barrier sterile technique utilized including caps, mask, sterile gowns,  sterile gloves, large sterile drape, hand hygiene, and Betadine prep. The right femoral head was marked fluoroscopically. Under ultrasound guidance, the right common femoral artery was accessed with a micropuncture kit after the overlying soft tissues were anesthetized with 1% lidocaine. An ultrasound image was saved for documentation purposes. The micropuncture sheath was exchanged for a 5 [place] vascular sheath over a Bentson wire. A closure arteriogram was performed through the side of the sheath confirming access within the right common femoral artery. Over a Bentson wire, a Mickelson catheter was advanced to the level of  the thoracic aorta where it was back bled and flushed. The catheter was then utilized to select the celiac artery and a selective celiac arteriogram was performed. Mickelson catheter was slowly retracted and utilized select the left gastric artery. Contrast injection confirmed the atretic appearance of this residual vessel. Next, a Fathom 14 micro wire was utilized to advance a regular Renegade microcatheter into the mid aspect of the remnant of this vessel. Contrast injection confirmed appropriate positioning. The residual left gastric artery was then subsequently embolized with multiple overlapping 3 and 2 mm interlock coils to near the level of its origin. Post embolization arteriogram images were obtained. Next, the [facility] catheter was utilized to select the left inferior phrenic artery. Again, the Fathom 14 micro wire was utilized to advance a regular Renegade microcatheter into the distal aspect of the left inferior phrenic artery, beyond the location of the pseudoaneurysm and active extravasation. The left inferior phrenic artery was then percutaneously coil embolized with multiple overlapping 3 and 4 mm diameter interlock coils across the area of pseudoaneurysm and active extravasation. Post embolization arteriogram images were obtained. Again, the Fathom 14 micro wire was utilized to advance a regular Renegade microcatheter into the distal aspect of the splenic artery. Several splenic arteriograms were performed both from the distal and mid aspects of the splenic artery. Images were reviewed and the procedure was terminated. All wires, catheters and sheaths were removed from the patient. Hemostasis was achieved at the right groin access site with deployment of an Exoseal closure device and manual compression. A dressing was placed. The patient tolerated the above procedure well without immediate postprocedural complication. FINDINGS: Initial celiac arteriogram demonstrates a conventional branching  pattern, however note diffuse vasospasm was noted throughout the celiac arterial system secondary to patient's hypovolemic state. Contrast injection demonstrates the origin of a residual, previously surgically ligated left gastric artery. Given concern for active extravasation from the gastric remnant as well as the diffuse vasospasm, this vessel was subsequently percutaneously coil embolized the level of the vessels origin. Post embolization arteriogram images demonstrate complete occlusion of this residual vessel. Contrast injection of the left inferior phrenic artery demonstrates active extravasation and pseudoaneurysm formation from its mid aspect, findings compatible with area of active extravasation demonstrated on preceding abdominal CT (coronal images 46 - 51, series 6). The inferior phrenic artery was percutaneously coil embolized across the level of pseudoaneurysm and active extravasation. Post embolization arteriogram images demonstrate complete occlusion of the left inferior phrenic artery without residual extravasation. Dedicated splenic arteriograms were negative for discrete area of vessel irregularity or contrast extravasation. As such, embolization of the splenic artery was not performed at this time. IMPRESSION: 1. Technically successful percutaneous coil embolization of the left inferior phrenic artery for active extravasation. 2. Technically successful prophylactic percutaneous coil embolization of the remnant left gastric artery. 3. No definite areas of active extravasation or vessel irregularity are identified arising from the splenic artery. As such, splenic  embolization was not performed at this time. Above findings discussed with Dr. Janann Colonel (critical care) at the time of procedure completion. Electronically Signed   By: Sandi Mariscal M.D.   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 Please perform mesenteric arteriogram and potential percutaneous coil embolization. EXAM: 1. ULTRASOUND GUIDANCE FOR ARTERIAL ACCESS 2. SELECTIVE CELIAC ARTERIOGRAM 3. SELECTIVE LEFT GASTRIC REMNANT ARTERIOGRAM AND PERCUTANEOUS COIL EMBOLIZATION. 4. SELECTIVE LEFT INFERIOR PHRENIC ARTERIOGRAM AND PERCUTANEOUS COIL EMBOLIZATION. 5. SELECTIVE SPLENIC ARTERIOGRAM MEDICATIONS: None ANESTHESIA/SEDATION: Moderate (conscious) sedation was employed during this procedure. A total of Versed 5 mg and Fentanyl 100 mcg was administered intravenously. Moderate Sedation Time: 105 minutes. The patient's level of consciousness and vital signs were monitored continuously by radiology nursing throughout the procedure under my direct supervision. CONTRAST:  80 cc Isovue 300 FLUOROSCOPY TIME:  Fluoroscopy Time: 14 minutes 18 seconds (693 mGy). COMPLICATIONS: None immediate. PROCEDURE: Informed consent was obtained from the patient and the patient's family following explanation of the procedure, risks, benefits and alternatives. The patient understands, agrees and consents for the procedure. All questions were addressed. A time out was performed prior to the initiation of the procedure. Maximal barrier sterile technique utilized including caps, mask, sterile gowns, sterile gloves, large sterile drape, hand hygiene, and Betadine prep. The right femoral head was marked fluoroscopically. Under ultrasound guidance, the right common femoral artery was accessed with a micropuncture kit after the overlying soft tissues were anesthetized with 1% lidocaine. An ultrasound image was saved for documentation purposes. The micropuncture sheath was exchanged for a 5 [place] vascular sheath over a Bentson wire. A closure arteriogram was performed through the side of the sheath confirming access within the right common femoral artery.  Over a Bentson wire, a Mickelson catheter was advanced to the level of the thoracic aorta where it was back bled and flushed. The catheter was then utilized to select the celiac artery and a selective celiac arteriogram was performed. Mickelson catheter was slowly retracted and utilized select the left gastric artery. Contrast injection confirmed the atretic appearance of this residual vessel. Next, a Fathom 14 micro wire was utilized to advance a regular Renegade microcatheter into the mid aspect of the remnant of this vessel. Contrast injection confirmed appropriate positioning. The residual left gastric artery was then subsequently embolized with multiple overlapping 3 and 2 mm interlock coils to near the level of its origin. Post embolization arteriogram images were obtained. Next, the [facility] catheter was utilized to select the left inferior phrenic artery. Again, the Fathom 14 micro wire was utilized to advance a regular Renegade microcatheter into the distal aspect of the left inferior phrenic artery, beyond the location of the pseudoaneurysm and active extravasation. The left inferior phrenic artery was then percutaneously coil embolized with multiple overlapping 3 and 4 mm diameter interlock coils across the area of pseudoaneurysm and active extravasation. Post embolization arteriogram images were obtained. Again, the Fathom 14 micro wire was utilized to advance a regular Renegade microcatheter into the distal aspect of the splenic artery. Several splenic arteriograms were performed both from the distal and mid aspects of the splenic artery. Images were reviewed and the procedure was terminated. All wires, catheters and sheaths were removed from the patient. Hemostasis was achieved at the right groin access site with deployment of an  Exoseal closure device and manual compression. A dressing was placed. The patient tolerated the above procedure well without immediate postprocedural complication. FINDINGS:  Initial celiac arteriogram demonstrates a conventional branching pattern, however note diffuse vasospasm was noted throughout the celiac arterial system secondary to patient's hypovolemic state. Contrast injection demonstrates the origin of a residual, previously surgically ligated left gastric artery. Given concern for active extravasation from the gastric remnant as well as the diffuse vasospasm, this vessel was subsequently percutaneously coil embolized the level of the vessels origin. Post embolization arteriogram images demonstrate complete occlusion of this residual vessel. Contrast injection of the left inferior phrenic artery demonstrates active extravasation and pseudoaneurysm formation from its mid aspect, findings compatible with area of active extravasation demonstrated on preceding abdominal CT (coronal images 46 - 51, series 6). The inferior phrenic artery was percutaneously coil embolized across the level of pseudoaneurysm and active extravasation. Post embolization arteriogram images demonstrate complete occlusion of the left inferior phrenic artery without residual extravasation. Dedicated splenic arteriograms were negative for discrete area of vessel irregularity or contrast extravasation. As such, embolization of the splenic artery was not performed at this time. IMPRESSION: 1. Technically successful percutaneous coil embolization of the left inferior phrenic artery for active extravasation. 2. Technically successful prophylactic percutaneous coil embolization of the remnant left gastric artery. 3. No definite areas of active extravasation or vessel irregularity are identified arising from the splenic artery. As such, splenic embolization was not performed at this time. Above findings discussed with Dr. Janann Colonel (critical care) at the time of procedure completion. Electronically Signed   By: Sandi Mariscal M.D.   On: 02/13/2017 08:20   Dg Abd Portable 1v  Result Date: 02/12/2017 CLINICAL DATA:   ABDOMINAL PAIN,HX GASTRECTOMY IN [place] 6-18 TO 6-30.hematemesis. History prior GI bleeding FLAT PLATE ABDOMEN PER DR CORNETT.PT FOR CT SCAN TODAY EXAM: PORTABLE ABDOMEN - 1 VIEW COMPARISON:  Chest x-ray 11/23/2011 FINDINGS: There is dilatation of numerous small bowel loops in the central abdomen. Surgical clips are identified in the right upper quadrant. No evidence for free intraperitoneal air on this supine views provided. Status post lumbar fusion. IMPRESSION: Findings consistent with partial or early small bowel obstruction. Electronically Signed   By: Nolon Nations M.D.   On: 02/12/2017 09:32   Dg Duanne Limerick W/water Sol Cm  Result Date: 02/13/2017 CLINICAL DATA:  Upper gastrointestinal hemorrhage. Extravasation from the phrenic artery treated by coil embolization yesterday. EXAM: WATER SOLUBLE UPPER GI SERIES TECHNIQUE: Single-column upper GI series was performed using water soluble contrast. CONTRAST:  Oral Isovue-300, approximately 100 ml. COMPARISON:  CT and interventional studies from yesterday. FLUOROSCOPY TIME:  Fluoroscopy Time: 54 seconds of low-dose pulsed fluoro Radiation Exposure Index (if provided by the fluoroscopic device): 13.7 mGy Number of Acquired Spot Images: 0 FINDINGS: The scout abdominal radiograph demonstrates embolization coils in the left upper quadrant. There is mild bowel distention and mild residual intraluminal contrast in the right lower quadrant. The patient swallowed the enteric contrast without difficulty. 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 No aspiration was observed. The gastroesophageal junction is patent. There is adequate filling of the gastric pouch. Underlying mucosal thickening and a filling defect in the pouch are difficult to exclude. There is spontaneous drainage from the gastric pouch into the gastrojejunostomy. No extravasation demonstrated. IMPRESSION: 1. No evidence of obstruction or leak status post partial gastrectomy and bypass. 2. Limited mucosal assessment  within the gastric pouch with possible mucosal thickening and/or intraluminal clot. Electronically Signed   By: Caryl Comes.D.  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 Please perform mesenteric arteriogram and potential percutaneous coil embolization. EXAM: 1. ULTRASOUND GUIDANCE FOR ARTERIAL ACCESS 2. SELECTIVE CELIAC ARTERIOGRAM 3. SELECTIVE LEFT GASTRIC REMNANT ARTERIOGRAM AND PERCUTANEOUS COIL EMBOLIZATION. 4. SELECTIVE LEFT INFERIOR PHRENIC ARTERIOGRAM AND PERCUTANEOUS COIL EMBOLIZATION. 5. SELECTIVE SPLENIC ARTERIOGRAM MEDICATIONS: None ANESTHESIA/SEDATION: Moderate (conscious) sedation was employed during this procedure. A total of Versed 5 mg and Fentanyl 100 mcg was administered intravenously. Moderate Sedation Time: 105 minutes. The patient's level of consciousness and vital signs were monitored continuously by radiology nursing throughout the procedure under my direct supervision. CONTRAST:  80 cc Isovue 300 FLUOROSCOPY TIME:  Fluoroscopy Time: 14 minutes 18 seconds (693 mGy). COMPLICATIONS: None immediate. PROCEDURE: Informed consent was obtained from the patient and the patient's family following explanation of the procedure, risks, benefits and alternatives. The patient understands, agrees and consents for the procedure. All questions were addressed. A time out was performed prior to the initiation of the procedure. Maximal barrier sterile technique utilized including caps, mask, sterile gowns, sterile gloves, large sterile drape, hand hygiene, and Betadine prep. The right femoral head was marked fluoroscopically. Under ultrasound guidance, the right common femoral artery was accessed with a micropuncture kit after the overlying soft  tissues were anesthetized with 1% lidocaine. An ultrasound image was saved for documentation purposes. The micropuncture sheath was exchanged for a 5 [place] vascular sheath over a Bentson wire. A closure arteriogram was performed through the side of the sheath confirming access within the right common femoral artery. Over a Bentson wire, a Mickelson catheter was advanced to the level of the thoracic aorta where it was back bled and flushed. The catheter was then utilized to select the celiac artery and a selective celiac arteriogram was performed. Mickelson catheter was slowly retracted and utilized select the left gastric artery. Contrast injection confirmed the atretic appearance of this residual vessel. Next, a Fathom 14 micro wire was utilized to advance a regular Renegade microcatheter into the mid aspect of the remnant of this vessel. Contrast injection confirmed appropriate positioning. The residual left gastric artery was then subsequently embolized with multiple overlapping 3 and 2 mm interlock coils to near the level of its origin. Post embolization arteriogram images were obtained. Next, the [facility] catheter was utilized to select the left inferior phrenic artery. Again, the Fathom 14 micro wire was utilized to advance a regular Renegade microcatheter into the distal aspect of the left inferior phrenic artery, beyond the location of the pseudoaneurysm and active extravasation. The left inferior phrenic artery was then percutaneously coil embolized with multiple overlapping 3 and 4 mm diameter interlock coils across the area of pseudoaneurysm and active extravasation. Post embolization arteriogram images were obtained. Again, the Fathom 14 micro wire was utilized to advance a regular Renegade microcatheter into the distal aspect of the splenic artery. Several splenic arteriograms were performed both from the distal and mid aspects of the splenic artery. Images were reviewed and the procedure was  terminated. All wires, catheters and sheaths were removed from the patient. Hemostasis was achieved at the right groin access site with deployment of an Exoseal closure device and manual compression. A dressing was placed. The patient tolerated the above procedure well without immediate postprocedural complication. FINDINGS: Initial celiac arteriogram demonstrates  a conventional branching pattern, however note diffuse vasospasm was noted throughout the celiac arterial system secondary to patient's hypovolemic state. Contrast injection demonstrates the origin of a residual, previously surgically ligated left gastric artery. Given concern for active extravasation from the gastric remnant as well as the diffuse vasospasm, this vessel was subsequently percutaneously coil embolized the level of the vessels origin. Post embolization arteriogram images demonstrate complete occlusion of this residual vessel. Contrast injection of the left inferior phrenic artery demonstrates active extravasation and pseudoaneurysm formation from its mid aspect, findings compatible with area of active extravasation demonstrated on preceding abdominal CT (coronal images 46 - 51, series 6). The inferior phrenic artery was percutaneously coil embolized across the level of pseudoaneurysm and active extravasation. Post embolization arteriogram images demonstrate complete occlusion of the left inferior phrenic artery without residual extravasation. Dedicated splenic arteriograms were negative for discrete area of vessel irregularity or contrast extravasation. As such, embolization of the splenic artery was not performed at this time. IMPRESSION: 1. Technically successful percutaneous coil embolization of the left inferior phrenic artery for active extravasation. 2. Technically successful prophylactic percutaneous coil embolization of the remnant left gastric artery. 3. No definite areas of active extravasation or vessel irregularity are identified  arising from the splenic artery. As such, splenic embolization was not performed at this time. Above findings discussed with Dr. Janann Colonel (critical care) at the time of procedure completion. Electronically Signed   By: Sandi Mariscal M.D.   On: 02/13/2017 08:20    Anti-infectives: Anti-infectives    Start     Dose/Rate Route Frequency Ordered Stop   02/13/17 1200  anidulafungin (ERAXIS) 100 mg in sodium chloride 0.9 % 100 mL IVPB     100 mg 78 mL/hr over 100 Minutes Intravenous Every 24 hours 02/12/17 1053     02/12/17 2300  vancomycin (VANCOCIN) 1,500 mg in sodium chloride 0.9 % 500 mL IVPB     1,500 mg 250 mL/hr over 120 Minutes Intravenous Every 24 hours 02/12/17 2214     02/12/17 1400  piperacillin-tazobactam (ZOSYN) IVPB 3.375 g     3.375 g 12.5 mL/hr over 240 Minutes Intravenous Every 8 hours 02/12/17 0805     02/12/17 1200  anidulafungin (ERAXIS) 200 mg in sodium chloride 0.9 % 200 mL IVPB     200 mg 78 mL/hr over 200 Minutes Intravenous  Once 02/12/17 1053 02/12/17 1542   02/12/17 0815  piperacillin-tazobactam (ZOSYN) IVPB 3.375 g     3.375 g 100 mL/hr over 30 Minutes Intravenous  Once 02/12/17 0803 02/12/17 0925       Assessment/Plan Hx of TIA on wafarin 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 Continue IV abx. Continue supportive care.   LOS: 3 days    Brigid Re , [facility] Surgery 02/14/2017, 7:55 AM Pager:  [phone] Consults: [phone] Mon-Fri 7:00 am-4:30 pm Sat-Sun 7:00 am-11:30 am

## 2017-02-15 ENCOUNTER — Encounter (HOSPITAL_COMMUNITY): Payer: Self-pay | Admitting: *Deleted

## 2017-02-15 ENCOUNTER — Inpatient Hospital Stay (HOSPITAL_COMMUNITY): Payer: Medicare Other

## 2017-02-15 LAB — CBC
HCT: 23.6 % — ABNORMAL LOW (ref 39.0–52.0)
HEMATOCRIT: 25.3 % — AB (ref 39.0–52.0)
HEMOGLOBIN: 7.7 g/dL — AB (ref 13.0–17.0)
HEMOGLOBIN: 8 g/dL — AB (ref 13.0–17.0)
MCH: 28.1 pg (ref 26.0–34.0)
MCH: 27.5 pg (ref 26.0–34.0)
MCHC: 32.6 g/dL (ref 30.0–36.0)
MCHC: 31.6 g/dL (ref 30.0–36.0)
MCV: 86.1 fL (ref 78.0–100.0)
MCV: 86.9 fL (ref 78.0–100.0)
PLATELETS: 212 10*3/uL (ref 150–400)
Platelets: 234 10*3/uL (ref 150–400)
RBC: 2.74 MIL/uL — AB (ref 4.22–5.81)
RBC: 2.91 MIL/uL — AB (ref 4.22–5.81)
RDW: 16.1 % — ABNORMAL HIGH (ref 11.5–15.5)
RDW: 16 % — ABNORMAL HIGH (ref 11.5–15.5)
WBC: 13.4 10*3/uL — ABNORMAL HIGH (ref 4.0–10.5)
WBC: 11.6 10*3/uL — ABNORMAL HIGH (ref 4.0–10.5)

## 2017-02-15 LAB — COMPREHENSIVE METABOLIC PANEL
ALBUMIN: 1.7 g/dL — AB (ref 3.5–5.0)
ALBUMIN: 1.8 g/dL — AB (ref 3.5–5.0)
ALK PHOS: 61 U/L (ref 38–126)
ALK PHOS: 76 U/L (ref 38–126)
ALT: 9 U/L — AB (ref 17–63)
ALT: 11 U/L — AB (ref 17–63)
ANION GAP: 5 (ref 5–15)
AST: 17 U/L (ref 15–41)
AST: 21 U/L (ref 15–41)
Anion gap: 7 (ref 5–15)
BUN: 10 mg/dL (ref 6–20)
BUN: 8 mg/dL (ref 6–20)
CALCIUM: 7.5 mg/dL — AB (ref 8.9–10.3)
CALCIUM: 7.7 mg/dL — AB (ref 8.9–10.3)
CHLORIDE: 111 mmol/L (ref 101–111)
CHLORIDE: 107 mmol/L (ref 101–111)
CO2: 20 mmol/L — AB (ref 22–32)
CO2: 23 mmol/L (ref 22–32)
CREATININE: 0.76 mg/dL (ref 0.61–1.24)
Creatinine, Ser: 0.72 mg/dL (ref 0.61–1.24)
GFR calc Af Amer: >60 mL/min (ref >60)
GFR calc non Af Amer: >60 mL/min (ref >60)
GFR calc non Af Amer: >60 mL/min (ref >60)
GLUCOSE: 86 mg/dL (ref 65–99)
GLUCOSE: 91 mg/dL (ref 65–99)
Potassium: 3.5 mmol/L (ref 3.5–5.1)
Potassium: 3.4 mmol/L — ABNORMAL LOW (ref 3.5–5.1)
SODIUM: 136 mmol/L (ref 135–145)
SODIUM: 137 mmol/L (ref 135–145)
Total Bilirubin: 1 mg/dL (ref 0.3–1.2)
Total Bilirubin: 1 mg/dL (ref 0.3–1.2)
Total Protein: 4.5 g/dL — ABNORMAL LOW (ref 6.5–8.1)
Total Protein: 5.1 g/dL — ABNORMAL LOW (ref 6.5–8.1)

## 2017-02-15 LAB — TYPE AND SCREEN
ABO/RH(D): O POS
Antibody Screen: NEGATIVE
UNIT DIVISION: 0
UNIT DIVISION: 0
UNIT DIVISION: 0
Unit division: 0
Unit division: 0
Unit division: 0
Unit division: 0

## 2017-02-15 LAB — BPAM RBC
BLOOD PRODUCT EXPIRATION DATE: 201807172359
BLOOD PRODUCT EXPIRATION DATE: 201807202359
BLOOD PRODUCT EXPIRATION DATE: 201807282359
BLOOD PRODUCT EXPIRATION DATE: 201807312359
Blood Product Expiration Date: 201807192359
Blood Product Expiration Date: 201807292359
Blood Product Expiration Date: 201807312359
ISSUE DATE / TIME: 201807042109
ISSUE DATE / TIME: 201807051953
ISSUE DATE / TIME: 201807052329
ISSUE DATE / TIME: 201807050026
ISSUE DATE / TIME: 201807051455
ISSUE DATE / TIME: 201807071735
UNIT TYPE AND RH: 5100
UNIT TYPE AND RH: 5100
UNIT TYPE AND RH: 5100
UNIT TYPE AND RH: 5100
Unit Type and Rh: 5100
Unit Type and Rh: 5100
Unit Type and Rh: 5100

## 2017-02-15 LAB — PROTIME-INR
INR: 1.34
PROTHROMBIN TIME: 16.6 s — AB (ref 11.4–15.2)

## 2017-02-15 LAB — BLOOD GAS, ARTERIAL
Acid-base deficit: 3.6 mmol/L — ABNORMAL HIGH (ref 0.0–2.0)
BICARBONATE: 21.9 mmol/L (ref 20.0–28.0)
Drawn by: 317771
O2 Content: 2 L/min
O2 Saturation: 96 %
PATIENT TEMPERATURE: 101.3
PO2 ART: 97.1 mmHg (ref 83.0–108.0)
pCO2 arterial: 50.4 mmHg — ABNORMAL HIGH (ref 32.0–48.0)
pH, Arterial: 7.27 — ABNORMAL LOW (ref 7.350–7.450)

## 2017-02-15 LAB — LACTIC ACID, PLASMA: LACTIC ACID, VENOUS: 0.5 mmol/L (ref 0.5–1.9)

## 2017-02-15 MED ORDER — IOPAMIDOL (ISOVUE-370) INJECTION 76%
100.0000 mL | Freq: Once | INTRAVENOUS | Status: AC | PRN
  Administered 2017-02-15: 100 mL via INTRAVENOUS

## 2017-02-15 MED ORDER — IPRATROPIUM-ALBUTEROL 0.5-2.5 (3) MG/3ML IN SOLN: 3.0000 mL | RESPIRATORY_TRACT | Status: DC | PRN

## 2017-02-15 MED ORDER — FUROSEMIDE 10 MG/ML IJ SOLN
20.0000 mg | Freq: Two times a day (BID) | INTRAMUSCULAR | Status: DC
  Administered 2017-02-15: 20 mg via INTRAVENOUS
  Filled 2017-02-15: qty 2

## 2017-02-15 MED ORDER — SODIUM CHLORIDE 0.9 % IV BOLUS (SEPSIS)
500.0000 mL | Freq: Once | INTRAVENOUS | Status: AC
  Administered 2017-02-15: 500 mL via INTRAVENOUS

## 2017-02-15 NOTE — Progress Notes (Addendum)
Called to the bedside to evaluate this patient with change in status.  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 He had been doing well apparently during the postoperative course until 02/11/2017 when he developed hematemesis, prompting presentation to the ED. Blood pressure was a little low and hemoglobin 6.8. He was started on PPI infusion and transfuse with 2 units of red blood cells.  He was transferred to the ICU and had a left inferior phrenic artery embolization by IR on 02/13/2017. Hemoglobin began to trend back down and he was transfused with additional units of PRBCs. He has received 6 units. He has been on empiric antibiotics.   CT was repeated today to evaluate for a new source of bleeding or expanding hematoma. His INR had been reversed and back to normal.  CT angiogram of abdomen/pelvis performed this afternoon is negative for active arterial bleed. The imaging study, however, was highly suspicious for an anastomotic leak which appeared to be relatively contained, but with some extraluminal air noted.  Patient was evaluated by surgery, felt to be stable for consultation with IR in the morning.  Patient is becoming less responsive, has spiked a fever, and became tachycardic to 140s.  On exam, he is lethargic, arousable with sternal rub, gives a one word answers before falling back asleep. Abdomen is soft, heart rate 1-teens, O2 saturation mid 90s on 4 L, respirations 15-20. Sonorous respirations with lungs clear to auscultation. Rate ~110 without conspicuous murmur, rub, or gallop. No pallor or diaphoresis. Makes brief eye contact before falling back asleep.  Plan is for stat repeat labs, with further investigations/treatments depending on pending blood work. Discussed plan with RN at bedside.  ADDENDUM:  Blood work has returned and is reassuring. HR has  also settled down in the interim. Will continue close monitoring.

## 2017-02-15 NOTE — Progress Notes (Signed)
Pt walked in hall twice during shift.  Tolerated well both times.   Jeffrey Crane, [name] T\

## 2017-02-15 NOTE — Progress Notes (Signed)
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  He had done well post op until 7/4 when he had large amount of hematemesis and reported to the ED.    In the ED he was guaiac positive, BP soft at 97/64, Hgb 6.8, PLT 539.  He was transfused 2u PRBC and started on a PPI gtt.  Significant Events: 7/4 admit 7/5 transfer to ICU - L inferior phrenic artery embolization in IR   Subjective: The patient reports that his pain was poorly controlled last night.  He denies pain at present having just received a dose of Dilaudid and some Phenergan.  He denies chest pain shortness breath fevers or chills.  His hemoglobin has been trending downward and he required 1 unit of packed red blood cells yesterday evening.  I had an extended discussion with his wife at the bedside is morning.  Assessment & Plan:  Rupture and acute extravasation from the left inferior phrenic artery tx w/ embolization in IR 7/6 - repeat CT today to f/u on hematoma and r/o additional source of blood loss   Intraabdominal hematoma due to above Empiric abx continues for now - tx pain and nausea - repeat CT today to f/u hematoma size in setting of falling Hdb  Acute blood loss anemia S/p 6 units PRBC thus far - unclear if pt actually still losing blood, or simply equilibrating - resume his bumex - follow serial Hgb - recheck CT to r/o new source of bleeding or expanding hematoma  Recent Labs Lab 02/13/17 1556 02/13/17 2200 02/14/17 0421 02/14/17 1624 02/15/17 0145  HGB 9.0* 8.1* 7.8* 6.9* 7.7*    Coumadin induced coagulopathy INR corrected to normal   Hx severe reflux s/p subtotal gastrectomy June 2018 No obstruction or leak per  UGI 7/6  L atrial appendage thrombus  Not able to anticoagulate presently for obvious reasons   COPD Well compensated   Severe malnutrition in context of chronic illness  MRSA swab +  DVT prophylaxis: SCDs Code Status: FULL CODE Family Communication: Spoke with wife at bedside at length Disposition Plan: CT scan abdomen  Consultants:  Gen Surgery IR Critical Care  Antimicrobials:  Zosyn 7/5 > Vanc 7/5 > Anidulafungin 7/5 >   Objective: Blood pressure 113/69, pulse 80, temperature 98.2 F (36.8 C), temperature source Oral, resp. rate 16, height 5\' 5"  (1.651 m), weight 57.9 kg (127 lb 11.2 oz), SpO2 97 %.  Intake/Output Summary (Last 24 hours) at 02/15/17 1127 Last data filed at 02/15/17 0800  Gross per 24 hour  Intake             3050 ml  Output              900 ml  Net             2150 ml   Filed Weights   02/12/17 0155 02/14/17 1630 02/14/17 1700  Weight: 52.1 kg (114 lb 13.8 oz) 58.6 kg (129 lb 1.6 oz) 57.9 kg (127 lb 11.2 oz)    Examination: General: No acute respiratory distress Lungs: CTA B w/o wheeze or crackles  Cardiovascular: RRR w/o gallup or rub  Abdomen: tender to palpation LUQ but w/o change -  no mass - no rebound - bs+ Extremities: 1+ B LE edema   CBC:  Recent Labs Lab 02/11/17 1950  02/12/17 0646 02/12/17 1029  02/13/17 1556 02/13/17 2200 02/14/17 0421 02/14/17 1624 02/15/17 0145  WBC 10.7*  --  15.6* 26.6*  --   --   --  16.5* 15.5* 13.4*  NEUTROABS 8.7*  --   --   --   --   --   --   --   --   --   HGB 6.8*  < > 8.6* 5.7*  < > 9.0* 8.1* 7.8* 6.9* 7.7*  HCT 22.3*  < > 26.9* 18.0*  < > 27.2* 24.1* 23.5* 21.0* 23.6*  MCV 84.8  --  87.1 85.7  --   --   --  84.2 84.3 86.1  PLT 539*  --  662* 563*  --   --   --  271 246 212  < > = values in this interval not displayed.   Basic Metabolic Panel:  Recent Labs Lab 02/11/17 1950 02/11/17 2003 02/12/17 0646 02/14/17 0421 02/15/17 0145  NA 136 139 137 136 136  K 4.0 4.1 4.1 3.7 3.5    CL 108 108 111 111 111  CO2 19*  --  18* 19* 20*  GLUCOSE 139* 134* 128* 101* 86  BUN 22* 23* 24* 14 10  CREATININE 1.01 0.90 0.80 0.79 0.72  CALCIUM 8.1*  --  7.9* 7.7* 7.5*  MG  --   --   --  1.8  --   PHOS  --   --   --  2.4*  --    GFR: Estimated Creatinine Clearance: 83.4 mL/min (by C-G formula based on SCr of 0.72 mg/dL).  Liver Function Tests:  Recent Labs Lab 02/11/17 1950 02/15/17 0145  AST 11* 17  ALT 8* 9*  ALKPHOS 67 61  BILITOT 0.3 1.0  PROT 5.2* 4.5*  ALBUMIN 2.2* 1.7*   Coagulation Profile:  Recent Labs Lab 02/11/17 1950 02/12/17 0646 02/13/17 0352 02/14/17 0421 02/15/17 0145  INR 1.23 1.26 1.39 1.49 1.34    HbA1C: Hgb A1c MFr Bld  Date/Time Value Ref Range Status  03/08/2007 07:37 AM   Final   5.1 (NOTE)   The ADA recommends the following therapeutic goals for glycemic   control related to Hgb A1C measurement:   Goal of Therapy:   < 7.0% Hgb A1C   Action Suggested:  > 8.0% Hgb A1C   Ref:  Diabetes Care, 22, Suppl. 1, 1999    CBG:  Recent Labs Lab 02/13/17 1202 02/13/17 2344 02/14/17 0632 02/14/17 1126 02/14/17 1638  GLUCAP 121* 115* 100* 91 79    Recent Results (from the past 240 hour(s))  MRSA PCR Screening     Status: Abnormal   Collection Time: 02/12/17  1:58 AM  Result Value Ref Range Status   MRSA by PCR POSITIVE (A) NEGATIVE Final    Comment:        The GeneXpert MRSA Assay (FDA approved for NASAL specimens only), is one component of a comprehensive MRSA colonization surveillance program. It is not intended to diagnose MRSA infection nor to guide or monitor treatment for MRSA infections. RESULT CALLED TO, READ BACK BY AND VERIFIED WITH: PETTIFORD,A RN [phone] 02/12/17 MITCHELL,L   Culture, blood (Routine X 2) w Reflex to ID Panel     Status: None (Preliminary result)   Collection Time: 02/13/17  5:00 AM  Result Value Ref Range Status   Specimen Description  BLOOD LEFT ANTECUBITAL  Final   Special Requests IN PEDIATRIC  BOTTLE Blood Culture adequate volume  Final   Culture NO GROWTH 2 DAYS  Final   Report Status PENDING  Incomplete  Culture, blood (Routine X 2) w Reflex to ID Panel     Status: None (Preliminary result)   Collection Time: 02/13/17  5:05 AM  Result Value Ref Range Status   Specimen Description BLOOD RIGHT HAND  Final   Special Requests IN PEDIATRIC BOTTLE Blood Culture adequate volume  Final   Culture NO GROWTH 2 DAYS  Final   Report Status PENDING  Incomplete     Scheduled Meds: . feeding supplement (ENSURE ENLIVE)  237 mL Oral BID BM  . gabapentin  300 mg Oral Daily  . LORazepam  0.5 mg Oral Q8H  . metoCLOPramide (REGLAN) injection  5 mg Intravenous Q6H  . mupirocin ointment  1 application Nasal BID  . polyethylene glycol  17 g Oral Daily  . senna-docusate  1 tablet Oral BID  . sodium chloride flush  10-40 mL Intracatheter Q12H     LOS: 4 days   Lonia Blood, MD [facility] Office  [phone] Pager - Text Page per Amion as per below:  On-Call/Text Page:      [email or web address]      password [email or web address]  If 7PM-7AM, please contact night-coverage [email or web address] Password [email or web address] 02/15/2017, 11:27 AM

## 2017-02-15 NOTE — Progress Notes (Signed)
Pharmacy Antibiotic Note  Jeffrey Crane is a 56 y.o. male continues on day #4 vancomycin, zosyn, and eraxis for possible intraabdominal infection s/p splenic rupture s/p perc coil embolization of phrenic/gastric artery. Renal function remains stable.   Plan: 1) Continue vancomycin 1500mg  IV q24 - check trough tonight 2) Continue zosyn 3.375g IV q8 (4 hour infusion) 3) Continue eraxis 100mg  IV q24  Height: 5\' 5"  (165.1 cm) Weight: 127 lb 11.2 oz (57.9 kg) IBW/kg (Calculated) : 61.5  Temp (24hrs), Avg:98.8 F (37.1 C), Min:98 F (36.7 C), Max:99.8 F (37.7 C)   Recent Labs Lab 02/11/17 1950 02/11/17 2003 02/12/17 0646 02/12/17 1029 02/12/17 1200 02/14/17 0421 02/14/17 1624 02/15/17 0145  WBC 10.7*  --  15.6* 26.6*  --  16.5* 15.5* 13.4*  CREATININE 1.01 0.90 0.80  --   --  0.79  --  0.72  LATICACIDVEN  --   --   --   --  1.5  --   --   --     Estimated Creatinine Clearance: 83.4 mL/min (by C-G formula based on SCr of 0.72 mg/dL).    Allergies  Allergen Reactions  . Famciclovir Rash  . Aspirin Other (See Comments)    Bleeding ulcer  . Avelox [Moxifloxacin Hcl In Nacl] Hives  . Chlorhexidine Gluconate Hives    Antimicrobials this admission: Zosyn 7/5>> Eraxis 7/5>> Vancomycin 7/5 >>  Dose adjustments this admission: n/a  Microbiology results: 7/6 blood x2>>  Thank you for allowing pharmacy to be a part of this patient's care.  Fredrik RiggerMarkle, Jeffrey Crane 02/15/2017 11:24 AM

## 2017-02-16 LAB — CBC
HCT: 25.7 % — ABNORMAL LOW (ref 39.0–52.0)
HEMATOCRIT: 22.8 % — AB (ref 39.0–52.0)
HEMOGLOBIN: 7.2 g/dL — AB (ref 13.0–17.0)
Hemoglobin: 8 g/dL — ABNORMAL LOW (ref 13.0–17.0)
MCH: 27.9 pg (ref 26.0–34.0)
MCH: 27.3 pg (ref 26.0–34.0)
MCHC: 31.6 g/dL (ref 30.0–36.0)
MCHC: 31.1 g/dL (ref 30.0–36.0)
MCV: 88.4 fL (ref 78.0–100.0)
MCV: 87.7 fL (ref 78.0–100.0)
PLATELETS: 277 10*3/uL (ref 150–400)
Platelets: 249 10*3/uL (ref 150–400)
RBC: 2.58 MIL/uL — AB (ref 4.22–5.81)
RBC: 2.93 MIL/uL — AB (ref 4.22–5.81)
RDW: 16.1 % — ABNORMAL HIGH (ref 11.5–15.5)
RDW: 15.8 % — AB (ref 11.5–15.5)
WBC: 9.8 10*3/uL (ref 4.0–10.5)
WBC: 8.5 10*3/uL (ref 4.0–10.5)

## 2017-02-16 LAB — BASIC METABOLIC PANEL
Anion gap: 5 (ref 5–15)
BUN: 8 mg/dL (ref 6–20)
CHLORIDE: 112 mmol/L — AB (ref 101–111)
CO2: 23 mmol/L (ref 22–32)
CREATININE: 0.71 mg/dL (ref 0.61–1.24)
Calcium: 7.1 mg/dL — ABNORMAL LOW (ref 8.9–10.3)
GFR calc non Af Amer: >60 mL/min (ref >60)
Glucose, Bld: 59 mg/dL — ABNORMAL LOW (ref 65–99)
POTASSIUM: 3.3 mmol/L — AB (ref 3.5–5.1)
Sodium: 140 mmol/L (ref 135–145)

## 2017-02-16 LAB — GLUCOSE, CAPILLARY
GLUCOSE-CAPILLARY: 31 mg/dL — AB (ref 65–99)
GLUCOSE-CAPILLARY: 98 mg/dL (ref 65–99)
Glucose-Capillary: 131 mg/dL — ABNORMAL HIGH (ref 65–99)

## 2017-02-16 LAB — PROTIME-INR
INR: 1.26
Prothrombin Time: 15.9 seconds — ABNORMAL HIGH (ref 11.4–15.2)

## 2017-02-16 LAB — VANCOMYCIN, TROUGH: Vancomycin Tr: 8 ug/mL — ABNORMAL LOW (ref 15–20)

## 2017-02-16 MED ORDER — DEXTROSE 50 % IV SOLN
0.5000 | Freq: Once | INTRAVENOUS | Status: AC
  Administered 2017-02-16: 25 mL via INTRAVENOUS

## 2017-02-16 MED ORDER — PANTOPRAZOLE SODIUM 40 MG PO TBEC
40.0000 mg | DELAYED_RELEASE_TABLET | Freq: Two times a day (BID) | ORAL | Status: DC
  Administered 2017-02-16 – 2017-02-18 (×4): 40 mg via ORAL
  Filled 2017-02-16 (×4): qty 1

## 2017-02-16 MED ORDER — DEXTROSE 50 % IV SOLN
INTRAVENOUS | Status: AC
  Administered 2017-02-16: 25 mL via INTRAVENOUS
  Filled 2017-02-16: qty 50

## 2017-02-16 MED ORDER — VANCOMYCIN HCL IN DEXTROSE 1-5 GM/200ML-% IV SOLN
1000.0000 mg | Freq: Two times a day (BID) | INTRAVENOUS | Status: DC
  Administered 2017-02-17 (×2): 1000 mg via INTRAVENOUS
  Filled 2017-02-16 (×3): qty 200

## 2017-02-16 MED ORDER — POTASSIUM CHLORIDE CRYS ER 20 MEQ PO TBCR
40.0000 meq | EXTENDED_RELEASE_TABLET | Freq: Once | ORAL | Status: AC
  Administered 2017-02-16: 40 meq via ORAL
  Filled 2017-02-16: qty 2

## 2017-02-16 NOTE — Progress Notes (Signed)
Patient has seemed to improve. RN took off BIPAP and placed on 3LNC with 02 saturations staying around 98-99%. BIPAP is still on standby at bedside. RT will continue to monitor.

## 2017-02-16 NOTE — Progress Notes (Signed)
Pt was hypoglycemic on AM labs with a glucose of 59. At 0645 pt was given of orange juice. And blood sugar will be rechecked 15 minutes after orange juice is finished.

## 2017-02-16 NOTE — Progress Notes (Signed)
Subjective/Chief Complaint: Feeling much better, up in chair. Conversant.   Objective: Vital signs in last 24 hours: Temp:  [97.9 F (36.6 C)-99.6 F (37.6 C)] 97.9 F (36.6 C) (07/09 0305) Pulse Rate:  [83-127] 99 (07/09 0400) Resp:  [11-26] 16 (07/09 0600) BP: (99-141)/(57-81) 107/74 (07/09 0600) SpO2:  [97 %-100 %] 99 % (07/09 0600) Last BM Date: 02/11/17  Intake/Output from previous day: 07/08 0701 - 07/09 0700 In: 4244 [P.O.:720; I.V.:2244; IV Piggyback:1280] Out: 2600 [Urine:2600] Intake/Output this shift: No intake/output data recorded.  General appearance: cooperative Resp: clear to auscultation bilaterally Cardio: regular rate and rhythm GI: soft, some tenderness along L side, +BS, incision with steri strips  Lab Results:   Recent Labs  02/15/17 1600 02/16/17 0550  WBC 11.6* 9.8  HGB 8.0* 7.2*  HCT 25.3* 22.8*  PLT 234 249   BMET  Recent Labs  02/15/17 1937 02/16/17 0550  NA 137 140  K 3.4* 3.3*  CL 107 112*  CO2 23 23  GLUCOSE 91 59*  BUN 8 8  CREATININE 0.76 0.71  CALCIUM 7.7* 7.1*   PT/INR  Recent Labs  02/15/17 0145 02/16/17 0550  LABPROT 16.6* 15.9*  INR 1.34 1.26   ABG  Recent Labs  02/15/17 1940  PHART 7.270*  HCO3 21.9    Studies/Results: Ct Angio Abd/pel W/ And/or W/o  Result Date: 02/15/2017 CLINICAL DATA:  Status post transcatheter embolization of left gastric artery and its left inferior phrenic artery on 02/13/2015 for pseudoaneurysm formation and active extravasation of contrast material at the level of the inferior phrenic artery. This is near the level of prior gastrectomy. There has been drop in hemoglobin last night requiring transfusion. EXAM: CT ANGIOGRAPHY ABDOMEN AND PELVIS WITH CONTRAST AND WITHOUT CONTRAST TECHNIQUE: Multidetector CT imaging of the abdomen and pelvis was performed using the standard protocol during bolus administration of intravenous contrast. Multiplanar reconstructed images and MIPs  were obtained and reviewed to evaluate the vascular anatomy. CONTRAST:  100 mL Isovue 370 IV COMPARISON:  CT of the abdomen and pelvis on 02/12/2017 as well as arteriographic images from the arteriogram and embolization procedure on the same date. FINDINGS: VASCULAR Aorta: Normal patency of the abdominal aorta without evidence of stenosis or aneurysm. Celiac: Normal patency of the celiac trunk. 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 Distal aspect of the inferior phrenic artery immediately below the diaphragm contains a series of embolization coils and is occluded. Just inferior to the inferior phrenic artery coils, there is a small residual amount of higher density fluid which does not change on the venous phase and is felt to represent a small amount of remaining high density fluid related to the large amount of extravasated vascular contrast seen on the prior CT prior to embolization. There is no further visualization of pseudoaneurysm or active extravasation of contrast by CTA. SMA: Normally patent. Renals: Widely patent bilateral single renal arteries. IMA: Normally patent inferior mesenteric artery. Inflow: Normally patent bilateral iliac arteries. Proximal Outflow: Normally patent bilateral common femoral arteries and femoral bifurcations. Veins: Venous phase imaging shows no evidence of mesenteric venous thrombus. The portal vein and splenic vein are normally patent. Review of the MIP images confirms the above findings. NON-VASCULAR Lower chest: Small a moderate left pleural effusion and small right pleural effusion. Associated left lower lobe atelectasis. Hepatobiliary: No focal liver abnormality is seen. Status post cholecystectomy. No biliary dilatation. Pancreas: Unremarkable. No pancreatic ductal dilatation or surrounding inflammatory changes. Spleen: Stable regular perfusion of  the spleen without evidence of splenic hematoma or contrast extravasation. Adrenals/Urinary Tract: Adrenal  glands are unremarkable. Kidneys are normal, without renal calculi, focal lesion, or hydronephrosis. Bladder is unremarkable. Stomach/Bowel: In the immediate subphrenic left upper quadrant, there is irregular air present, some of which is clearly extraluminal from the stomach and bowel status post prior gastrectomy. There does appear to be disruption along the posterior aspect of the gastric suture line. There remains some adjacent fluid in the subphrenic space. The recent upper GI and may not have fully delineated leak due to under-distention of the residual stomach and relatively contained leak. Other bowel shows no evidence of obstruction or perforation. Lymphatic: No enlarged lymph nodes identified. Reproductive: Prostate is unremarkable. Other: No hernias identified. Musculoskeletal: No acute or significant osseous findings. IMPRESSION: VASCULAR No evidence of active arterial bleeding by CT angiography after coil embolization of the left gastric artery and left inferior phrenic artery. No further arterial pseudoaneurysm visualized. A small amount of residual high density fluid immediately inferior to the phrenic artery coils shows no change on arterial and venous phases and likely represents some residual high density fluid related to the large amount of extravasated intravascular contrast at the time of the prior CT study. NON-VASCULAR The current study is highly suspicious for disruption of the gastrectomy suture line posteriorly resulting in extraluminal air in the subphrenic space. This appears to be a relatively contained area of leakage but there clearly is some air present outside of the lumen of the stomach and small bowel in this region. There is some associated adjacent fluid in the left subphrenic space. These results were called by telephone at the time of interpretation on 02/15/2017 at 4:02 pm to Dr. Jetty Duhamel , who verbally acknowledged these results. Electronically Signed   By: Irish Lack M.D.   On: 02/15/2017 16:02    Anti-infectives: Anti-infectives    Start     Dose/Rate Route Frequency Ordered Stop   02/13/17 1200  anidulafungin (ERAXIS) 100 mg in sodium chloride 0.9 % 100 mL IVPB     100 mg 78 mL/hr over 100 Minutes Intravenous Every 24 hours 02/12/17 1053     02/12/17 2300  vancomycin (VANCOCIN) 1,500 mg in sodium chloride 0.9 % 500 mL IVPB     1,500 mg 250 mL/hr over 120 Minutes Intravenous Every 24 hours 02/12/17 2214     02/12/17 1400  piperacillin-tazobactam (ZOSYN) IVPB 3.375 g     3.375 g 12.5 mL/hr over 240 Minutes Intravenous Every 8 hours 02/12/17 0805     02/12/17 1200  anidulafungin (ERAXIS) 200 mg in sodium chloride 0.9 % 200 mL IVPB     200 mg 78 mL/hr over 200 Minutes Intravenous  Once 02/12/17 1053 02/12/17 1542   02/12/17 0815  piperacillin-tazobactam (ZOSYN) IVPB 3.375 g     3.375 g 100 mL/hr over 30 Minutes Intravenous  Once 02/12/17 0803 02/12/17 0925      Assessment/Plan: Post-op bleed S/P gastrectomy at outside hospital - S/P angioembolization - full liquids Hematoma - possibly infected - WBC down and afeb - continue IV abx Anemia - Hb 7.2  FEN - Full liquids, ensure, IV protonix VTE - SCDs, no lovenox due to active bleeding ID - zosyn (7/5>>), vanco (7/5>>), anidulafungin (7/5>>)  Plan: fulls plus ensure for now  LOS: 5 days    Trenese Haft E 02/16/2017

## 2017-02-16 NOTE — Progress Notes (Signed)
Recheck of blood sugar was 31 on glucometer. 1/2 amp of D50 was given IV. Blood glucose will be rechecked in 15 minutes

## 2017-02-16 NOTE — Progress Notes (Signed)
Pharmacy Antibiotic Note  Jeffrey Crane is a 56 y.o. male continues on day #5 vancomycin, zosyn, and eraxis for possible intraabdominal infection s/p splenic rupture s/p perc coil embolization of phrenic/gastric artery. Renal function remains stable.  Vancomycin trough = 8 mcg/ml on vancomycin 1500 mg IV q24h . Trough drawn about 1.5 hours early. Goal vanc trough =15-20 mcg/ml Blood cultures no growth to date.   Plan: 1) Give vancomycin 1500mg  IV as scheduled now then will change future doses to 1g IV q12h for vanc trough goal 15-1220mcg/ml. 2) Recheck vancomycin trough at steady state on new dose. 3) Continue zosyn 3.375g IV q8h (4 hour infusion) 4) Continue eraxis 100mg  IV q24h  Height: 5\' 5"  (165.1 cm) Weight: 127 lb 11.2 oz (57.9 kg) IBW/kg (Calculated) : 61.5  Temp (24hrs), Avg:98.4 F (36.9 C), Min:97.5 F (36.4 C), Max:99.6 F (37.6 C)   Recent Labs Lab 02/12/17 0646  02/12/17 1200 02/14/17 0421 02/14/17 1624 02/15/17 0145 02/15/17 1600 02/15/17 1930 02/15/17 1937 02/16/17 0550 02/16/17 2040  WBC 15.6*  < >  --  16.5* 15.5* 13.4* 11.6*  --   --  9.8 8.5  CREATININE 0.80  --   --  0.79  --  0.72  --   --  0.76 0.71  --   LATICACIDVEN  --   --  1.5  --   --   --   --  0.5  --   --   --   VANCOTROUGH  --   --   --   --   --   --   --   --   --   --  8*  < > = values in this interval not displayed.  Estimated Creatinine Clearance: 83.4 mL/min (by C-G formula based on SCr of 0.71 mg/dL).    Allergies  Allergen Reactions  . Famciclovir Rash  . Aspirin Other (See Comments)    Bleeding ulcer  . Avelox [Moxifloxacin Hcl In Nacl] Hives  . Chlorhexidine Gluconate Hives    Antimicrobials this admission: Zosyn 7/5>> Eraxis 7/5>> Vancomycin 7/5 >>  Dose adjustments this admission: 7/9 VT =8  (on 1500mg  IV q24h) dose adjusted to 1g IV q12h  Microbiology results: 7/6 blood x2>>ngtd  7/5 MRSA PCR: positive  Thank you for allowing pharmacy to be a part of this  patient's care. Noah Delaineuth Evagelia Knack, RPh Clinical Pharmacist Pager: [phone] 02/16/2017 10:16 PM

## 2017-02-16 NOTE — Progress Notes (Signed)
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  He had done well post op until 7/4 when he had large amount of hematemesis and reported to the ED.    In the ED he was guaiac positive, BP soft at 97/64, Hgb 6.8, PLT 539.  He was transfused 2u PRBC and started on a PPI gtt.  Significant Events: 7/4 admit 7/5 transfer to ICU - L inferior phrenic artery embolization in IR   Subjective: The patient looks dramatically better today.  He denies pain at this time.  He denies shortness of breath fevers chills nausea or vomiting.  He is anxious to be allowed to eat and states he is very hungry.  He is have multiple dark [place] bowel movements today.  Assessment & Plan:  Rupture and acute extravasation from the left inferior phrenic artery tx w/ embolization in IR 7/6 - repeat CT 7/8 noted no evidence of ongoing bleeding   Intraabdominal hematoma due to above Empiric abx continues for now - clinically much improved   Acute blood loss anemia S/p 6 units PRBC thus far - no evidence of ongoing bleeding on repeat CT abdomen - hemoglobin continues to fluctuate but this may be due to equilibrium - recheck this evening and transfuse if less than 7.0  Recent Labs Lab 02/14/17 0421 02/14/17 1624 02/15/17 0145 02/15/17 1600 02/16/17 0550  HGB 7.8* 6.9* 7.7* 8.0* 7.2*    Coumadin induced coagulopathy INR corrected to normal   Hx severe reflux s/p subtotal gastrectomy June 2018 No obstruction or leak per UGI 7/6  L atrial appendage thrombus  Not able to anticoagulate presently for obvious reasons   COPD Well compensated at this time   Severe malnutrition in context of chronic illness  MRSA swab  +  DVT prophylaxis: SCDs Code Status: FULL CODE Family Communication: No family present at time of exam today Disposition Plan: SDU   Consultants:  Gen Surgery IR Critical Care  Antimicrobials:  Zosyn 7/5 > Vanc 7/5 > Anidulafungin 7/5 >   Objective: Blood pressure 107/74, pulse 99, temperature (!) 97.5 F (36.4 C), resp. rate 16, height 5\' 5"  (1.651 m), weight 57.9 kg (127 lb 11.2 oz), SpO2 99 %.  Intake/Output Summary (Last 24 hours) at 02/16/17 1521 Last data filed at 02/16/17 1300  Gross per 24 hour  Intake             4914 ml  Output             2475 ml  Net             2439 ml   Filed Weights   02/12/17 0155 02/14/17 1630 02/14/17 1700  Weight: 52.1 kg (114 lb 13.8 oz) 58.6 kg (129 lb 1.6 oz) 57.9 kg (127 lb 11.2 oz)    Examination: General: No acute respiratory distress - alert and conversant  Lungs: CTA B - no wheezing or crackles  Cardiovascular: RRR - no M or rub   Abdomen: soft - bs+ - no rebound - nondistended  Extremities: no signif edema B LE   CBC:  Recent Labs Lab 02/11/17 1950  02/14/17 0421 02/14/17 1624 02/15/17 0145 02/15/17 1600 02/16/17 0550  WBC 10.7*  < >  16.5* 15.5* 13.4* 11.6* 9.8  NEUTROABS 8.7*  --   --   --   --   --   --   HGB 6.8*  < > 7.8* 6.9* 7.7* 8.0* 7.2*  HCT 22.3*  < > 23.5* 21.0* 23.6* 25.3* 22.8*  MCV 84.8  < > 84.2 84.3 86.1 86.9 88.4  PLT 539*  < > 271 246 212 234 249  < > = values in this interval not displayed.   Basic Metabolic Panel:  Recent Labs Lab 02/12/17 0646 02/14/17 0421 02/15/17 0145 02/15/17 1937 02/16/17 0550  NA 137 136 136 137 140  K 4.1 3.7 3.5 3.4* 3.3*  CL 111 111 111 107 112*  CO2 18* 19* 20* 23 23  GLUCOSE 128* 101* 86 91 59*  BUN 24* 14 10 8 8   CREATININE 0.80 0.79 0.72 0.76 0.71  CALCIUM 7.9* 7.7* 7.5* 7.7* 7.1*  MG  --  1.8  --   --   --   PHOS  --  2.4*  --   --   --    GFR: Estimated Creatinine Clearance: 83.4 mL/min (by C-G formula based on SCr of 0.71 mg/dL).  Liver  Function Tests:  Recent Labs Lab 02/11/17 1950 02/15/17 0145 02/15/17 1937  AST 11* 17 21  ALT 8* 9* 11*  ALKPHOS 67 61 76  BILITOT 0.3 1.0 1.0  PROT 5.2* 4.5* 5.1*  ALBUMIN 2.2* 1.7* 1.8*   Coagulation Profile:  Recent Labs Lab 02/12/17 0646 02/13/17 0352 02/14/17 0421 02/15/17 0145 02/16/17 0550  INR 1.26 1.39 1.49 1.34 1.26    HbA1C: Hgb A1c MFr Bld  Date/Time Value Ref Range Status  03/08/2007 07:37 AM   Final   5.1 (NOTE)   The ADA recommends the following therapeutic goals for glycemic   control related to Hgb A1C measurement:   Goal of Therapy:   < 7.0% Hgb A1C   Action Suggested:  > 8.0% Hgb A1C   Ref:  Diabetes Care, 22, Suppl. 1, 1999    CBG:  Recent Labs Lab 02/14/17 1126 02/14/17 1638 02/16/17 0659 02/16/17 0753 02/16/17 1121  GLUCAP 91 79 31* 98 131*    Recent Results (from the past 240 hour(s))  MRSA PCR Screening     Status: Abnormal   Collection Time: 02/12/17  1:58 AM  Result Value Ref Range Status   MRSA by PCR POSITIVE (A) NEGATIVE Final    Comment:        The GeneXpert MRSA Assay (FDA approved for NASAL specimens only), is one component of a comprehensive MRSA colonization surveillance program. It is not intended to diagnose MRSA infection nor to guide or monitor treatment for MRSA infections. RESULT CALLED TO, READ BACK BY AND VERIFIED WITH: PETTIFORD,A RN [phone] 02/12/17 MITCHELL,L   Culture, blood (Routine X 2) w Reflex to ID Panel     Status: None (Preliminary result)   Collection Time: 02/13/17  5:00 AM  Result Value Ref Range Status   Specimen Description BLOOD LEFT ANTECUBITAL  Final   Special Requests IN PEDIATRIC BOTTLE Blood Culture adequate volume  Final   Culture NO GROWTH 3 DAYS  Final   Report Status PENDING  Incomplete  Culture, blood (Routine X 2) w Reflex to ID Panel     Status: None (Preliminary result)   Collection Time: 02/13/17  5:05 AM  Result Value Ref Range Status   Specimen Description BLOOD RIGHT HAND   Final   Special Requests IN PEDIATRIC BOTTLE Blood Culture  adequate volume  Final   Culture NO GROWTH 3 DAYS  Final   Report Status PENDING  Incomplete     Scheduled Meds: . feeding supplement (ENSURE ENLIVE)  237 mL Oral BID BM  . gabapentin  300 mg Oral Daily  . LORazepam  0.5 mg Oral Q8H  . metoCLOPramide (REGLAN) injection  5 mg Intravenous Q6H  . mupirocin ointment  1 application Nasal BID  . polyethylene glycol  17 g Oral Daily  . senna-docusate  1 tablet Oral BID  . sodium chloride flush  10-40 mL Intracatheter Q12H     LOS: 5 days   Lonia Blood, MD [facility] Office  [phone] Pager - Text Page per Amion as per below:  On-Call/Text Page:      [email or web address]      password [email or web address]  If 7PM-7AM, please contact night-coverage [email or web address] Password [email or web address] 02/16/2017, 3:21 PM

## 2017-02-17 DIAGNOSIS — G894 Chronic pain syndrome: Secondary | ICD-10-CM

## 2017-02-17 DIAGNOSIS — D62 Acute posthemorrhagic anemia: Secondary | ICD-10-CM

## 2017-02-17 DIAGNOSIS — J42 Unspecified chronic bronchitis: Secondary | ICD-10-CM

## 2017-02-17 DIAGNOSIS — E43 Unspecified severe protein-calorie malnutrition: Secondary | ICD-10-CM

## 2017-02-17 DIAGNOSIS — I513 Intracardiac thrombosis, not elsewhere classified: Secondary | ICD-10-CM

## 2017-02-17 DIAGNOSIS — R58 Hemorrhage, not elsewhere classified: Secondary | ICD-10-CM

## 2017-02-17 LAB — CBC
HEMATOCRIT: 27.8 % — AB (ref 39.0–52.0)
HEMOGLOBIN: 8.7 g/dL — AB (ref 13.0–17.0)
MCH: 27.4 pg (ref 26.0–34.0)
MCHC: 31.3 g/dL (ref 30.0–36.0)
MCV: 87.7 fL (ref 78.0–100.0)
Platelets: 287 10*3/uL (ref 150–400)
RBC: 3.17 MIL/uL — ABNORMAL LOW (ref 4.22–5.81)
RDW: 15.7 % — ABNORMAL HIGH (ref 11.5–15.5)
WBC: 11.5 10*3/uL — ABNORMAL HIGH (ref 4.0–10.5)

## 2017-02-17 LAB — BASIC METABOLIC PANEL
Anion gap: 7 (ref 5–15)
BUN: 5 mg/dL — AB (ref 6–20)
CHLORIDE: 110 mmol/L (ref 101–111)
CO2: 22 mmol/L (ref 22–32)
CREATININE: 0.56 mg/dL — AB (ref 0.61–1.24)
Calcium: 7.8 mg/dL — ABNORMAL LOW (ref 8.9–10.3)
GFR calc Af Amer: >60 mL/min (ref >60)
GFR calc non Af Amer: >60 mL/min (ref >60)
GLUCOSE: 94 mg/dL (ref 65–99)
Potassium: 3.8 mmol/L (ref 3.5–5.1)
SODIUM: 139 mmol/L (ref 135–145)

## 2017-02-17 LAB — TYPE AND SCREEN
ABO/RH(D): O POS
Antibody Screen: NEGATIVE

## 2017-02-17 MED ORDER — OXYCODONE HCL 5 MG PO TABS
5.0000 mg | ORAL_TABLET | ORAL | Status: DC | PRN
  Administered 2017-02-17: 5 mg via ORAL
  Filled 2017-02-17: qty 1

## 2017-02-17 MED ORDER — PREMIER PROTEIN SHAKE
11.0000 [oz_av] | Freq: Three times a day (TID) | ORAL | Status: DC
  Administered 2017-02-17 (×2): 11 [oz_av] via ORAL
  Filled 2017-02-17 (×7): qty 325.31

## 2017-02-17 NOTE — Plan of Care (Signed)
Problem: Bowel/Gastric: Goal: Will not experience complications related to bowel motility Outcome: Progressing Having periods of diarrhea, getting up and ambulating on own, with one person stadning by. C/o pain frequently, utilizing medications for nausea and pain frequently.

## 2017-02-17 NOTE — Care Management Note (Signed)
Case Management Note  Patient Details  Name: Ishmael HolterJames L Sato MRN: [id number] Date of Birth: 1961/07/28  Subjective/Objective:       Pt is s/p gastrectomy at an outside hospital - developed bleeding and now s/p Angioembolization             Action/Plan:   PTA independent traveling from [place].  Pt is still having dark stools - transitioning to soft diet, IV Protonix.  CM will continue to follow for discharge needs   Expected Discharge Date:                  Expected Discharge Plan:  Home/Self Care  In-House Referral:     Discharge planning Services  CM Consult  Post Acute Care Choice:    Choice offered to:     DME Arranged:    DME Agency:     HH Arranged:    HH Agency:     Status of Service:  In process, will continue to follow  If discussed at Long Length of Stay Meetings, dates discussed:    Additional Comments:  Cherylann ParrClaxton, Oakleigh Hesketh S, RN 02/17/2017, 2:28 PM

## 2017-02-17 NOTE — Progress Notes (Signed)
Nutrition Follow-up  DOCUMENTATION CODES:   Severe malnutrition in context of chronic illness  INTERVENTION:   -D/c Ensure Enlive po BID, each supplement provides 350 kcal and 20 grams of protein -Premier Protein TID  NUTRITION DIAGNOSIS:   Malnutrition (Severe) related to chronic illness (gastric dysmostility s/p gastrectomy) as evidenced by severe depletion of body fat, severe depletion of muscle mass, percent weight loss, energy intake < 75% for > or equal to 1 month.  Ongoing  GOAL:   Patient will meet greater than or equal to 90% of their needs  Progressing  MONITOR:   PO intake, Supplement acceptance, Diet advancement, Labs, Weight trends, Skin, I & O's  REASON FOR ASSESSMENT:   Consult, Malnutrition Screening Tool Assessment of nutrition requirement/status  ASSESSMENT:   Ishmael HolterJames L Cullin is a 56 y.o. male with medical history significant of previous GI bleed, hypoglycemia, left atrium thrombosis on warfarin, previous TIA, history of small bowel resection, who is status post gastrectomy in [place] last month (01-26-2017 to 02-07-2017) who just traveled to the area by private vehicle two days ago and was tolerating increasing amounts and consistency of food since procedure until today in the afternoon when he had a significant amount of hematemesis this afternoon around 1500  Pt was found to have splenic rupture. S/p mesenteric arteriogram and percutaneous coil embolization of the residual left gastric artery and the left phrenic artery on 02/12/17.    Pt sleeping soundly at time of visit. RD did not disturb.   Pt now on a soft diet. Pt on Ensure supplements, however, noted several instances of diarrhea. RD will modify supplement order to bariatric type supplement.   Labs reviewed: CBGS: 98-131.   Diet Order:  DIET SOFT Room service appropriate? Yes; Fluid consistency: Thin  Skin:   (closed abdominal incision)  Last BM:  02/11/17  Height:   Ht Readings from Last 1  Encounters:  02/14/17 5\' 5"  (1.651 m)    Weight:   Wt Readings from Last 1 Encounters:  02/14/17 127 lb 11.2 oz (57.9 kg)    Ideal Body Weight:  61.8 kg  BMI:  Body mass index is 21.25 kg/m.  Estimated Nutritional Needs:   Kcal:  1800-2000  Protein:  100-115 grams  Fluid:  1.8-2.0 L  EDUCATION NEEDS:   Education needs addressed  Kadiatou Oplinger A. Mayford KnifeWilliams, RD, LDN, CDE Pager: [phone] After hours Pager: [phone]

## 2017-02-17 NOTE — Progress Notes (Signed)
[facility] Progress Note     Subjective: CC:  LLQ pain worse with movement. Tolerating SOFT diet. Reports 7 loose BMs that were initially black but are not [place]. Denies nausea or vomiting. Mobilizing without dizziness.  Objective: Vital signs in last 24 hours: Temp:  [97.5 F (36.4 C)-98.9 F (37.2 C)] 98.5 F (36.9 C) (07/10 0713) Pulse Rate:  [30-108] 93 (07/10 0713) Resp:  [15-22] 20 (07/10 0713) BP: (104-148)/(65-91) 104/81 (07/10 0713) SpO2:  [90 %-100 %] 98 % (07/10 0713) Last BM Date: 02/16/17  Intake/Output from previous day: 07/09 0701 - 07/10 0700 In: 1997.5 [P.O.:240; I.V.:1757.5] Out: 800 [Urine:800] Intake/Output this shift: No intake/output data recorded.  PE: Gen:  Alert, NAD, pleasant Card:  Regular rate and rhythm, pedal pulses 2+ BL Pulm:  Normal effort Abd: Soft, mild TTP left flank, no peritonitis, bowel sounds present  Skin: warm and dry, no rashes  Psych: A&Ox3   Lab Results:   Recent Labs  02/16/17 2040 02/17/17 0430  WBC 8.5 11.5*  HGB 8.0* 8.7*  HCT 25.7* 27.8*  PLT 277 287   BMET  Recent Labs  02/16/17 0550 02/17/17 0430  NA 140 139  K 3.3* 3.8  CL 112* 110  CO2 23 22  GLUCOSE 59* 94  BUN 8 5*  CREATININE 0.71 0.56*  CALCIUM 7.1* 7.8*   PT/INR  Recent Labs  02/15/17 0145 02/16/17 0550  LABPROT 16.6* 15.9*  INR 1.34 1.26   CMP     Component Value Date/Time   NA 139 02/17/2017 0430   K 3.8 02/17/2017 0430   CL 110 02/17/2017 0430   CO2 22 02/17/2017 0430   GLUCOSE 94 02/17/2017 0430   BUN 5 (L) 02/17/2017 0430   CREATININE 0.56 (L) 02/17/2017 0430   CALCIUM 7.8 (L) 02/17/2017 0430   PROT 5.1 (L) 02/15/2017 1937   ALBUMIN 1.8 (L) 02/15/2017 1937   AST 21 02/15/2017 1937   ALT 11 (L) 02/15/2017 1937   ALKPHOS 76 02/15/2017 1937   BILITOT 1.0 02/15/2017 1937   GFRNONAA >60 02/17/2017 0430   GFRAA >60 02/17/2017 0430   Lipase  No results found for: LIPASE     Studies/Results: Ct Angio  Abd/pel W/ And/or W/o  Result Date: 02/15/2017 CLINICAL DATA:  Status post transcatheter embolization of left gastric artery and its left inferior phrenic artery on 02/13/2015 for pseudoaneurysm formation and active extravasation of contrast material at the level of the inferior phrenic artery. This is near the level of prior gastrectomy. There has been drop in hemoglobin last night requiring transfusion. EXAM: CT ANGIOGRAPHY ABDOMEN AND PELVIS WITH CONTRAST AND WITHOUT CONTRAST TECHNIQUE: Multidetector CT imaging of the abdomen and pelvis was performed using the standard protocol during bolus administration of intravenous contrast. Multiplanar reconstructed images and MIPs were obtained and reviewed to evaluate the vascular anatomy. CONTRAST:  100 mL Isovue 370 IV COMPARISON:  CT of the abdomen and pelvis on 02/12/2017 as well as arteriographic images from the arteriogram and embolization procedure on the same date. FINDINGS: VASCULAR Aorta: Normal patency of the abdominal aorta without evidence of stenosis or aneurysm. Celiac: Normal patency of the celiac trunk. 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 Distal aspect of the inferior phrenic artery immediately below the diaphragm contains a series of embolization coils and is occluded. Just inferior to the inferior phrenic artery coils, there is a small residual amount of higher density fluid which does not change on the venous phase and is felt  to represent a small amount of remaining high density fluid related to the large amount of extravasated vascular contrast seen on the prior CT prior to embolization. There is no further visualization of pseudoaneurysm or active extravasation of contrast by CTA. SMA: Normally patent. Renals: Widely patent bilateral single renal arteries. IMA: Normally patent inferior mesenteric artery. Inflow: Normally patent bilateral iliac arteries. Proximal Outflow: Normally patent bilateral common femoral arteries and  femoral bifurcations. Veins: Venous phase imaging shows no evidence of mesenteric venous thrombus. The portal vein and splenic vein are normally patent. Review of the MIP images confirms the above findings. NON-VASCULAR Lower chest: Small a moderate left pleural effusion and small right pleural effusion. Associated left lower lobe atelectasis. Hepatobiliary: No focal liver abnormality is seen. Status post cholecystectomy. No biliary dilatation. Pancreas: Unremarkable. No pancreatic ductal dilatation or surrounding inflammatory changes. Spleen: Stable regular perfusion of the spleen without evidence of splenic hematoma or contrast extravasation. Adrenals/Urinary Tract: Adrenal glands are unremarkable. Kidneys are normal, without renal calculi, focal lesion, or hydronephrosis. Bladder is unremarkable. Stomach/Bowel: In the immediate subphrenic left upper quadrant, there is irregular air present, some of which is clearly extraluminal from the stomach and bowel status post prior gastrectomy. There does appear to be disruption along the posterior aspect of the gastric suture line. There remains some adjacent fluid in the subphrenic space. The recent upper GI and may not have fully delineated leak due to under-distention of the residual stomach and relatively contained leak. Other bowel shows no evidence of obstruction or perforation. Lymphatic: No enlarged lymph nodes identified. Reproductive: Prostate is unremarkable. Other: No hernias identified. Musculoskeletal: No acute or significant osseous findings. IMPRESSION: VASCULAR No evidence of active arterial bleeding by CT angiography after coil embolization of the left gastric artery and left inferior phrenic artery. No further arterial pseudoaneurysm visualized. A small amount of residual high density fluid immediately inferior to the phrenic artery coils shows no change on arterial and venous phases and likely represents some residual high density fluid related to the  large amount of extravasated intravascular contrast at the time of the prior CT study. NON-VASCULAR The current study is highly suspicious for disruption of the gastrectomy suture line posteriorly resulting in extraluminal air in the subphrenic space. This appears to be a relatively contained area of leakage but there clearly is some air present outside of the lumen of the stomach and small bowel in this region. There is some associated adjacent fluid in the left subphrenic space. These results were called by telephone at the time of interpretation on 02/15/2017 at 4:02 pm to Dr. Jetty Duhamel , who verbally acknowledged these results. Electronically Signed   By: Irish Lack M.D.   On: 02/15/2017 16:02    Anti-infectives: Anti-infectives    Start     Dose/Rate Route Frequency Ordered Stop   02/17/17 1100  vancomycin (VANCOCIN) IVPB 1000 mg/200 mL premix     1,000 mg 200 mL/hr over 60 Minutes Intravenous Every 12 hours 02/16/17 2224     02/13/17 1200  anidulafungin (ERAXIS) 100 mg in sodium chloride 0.9 % 100 mL IVPB     100 mg 78 mL/hr over 100 Minutes Intravenous Every 24 hours 02/12/17 1053     02/12/17 2300  vancomycin (VANCOCIN) 1,500 mg in sodium chloride 0.9 % 500 mL IVPB  Status:  Discontinued     1,500 mg 250 mL/hr over 120 Minutes Intravenous Every 24 hours 02/12/17 2214 02/16/17 2226   02/12/17 1400  piperacillin-tazobactam (ZOSYN) IVPB 3.375  g     3.375 g 12.5 mL/hr over 240 Minutes Intravenous Every 8 hours 02/12/17 0805     02/12/17 1200  anidulafungin (ERAXIS) 200 mg in sodium chloride 0.9 % 200 mL IVPB     200 mg 78 mL/hr over 200 Minutes Intravenous  Once 02/12/17 1053 02/12/17 1542   02/12/17 0815  piperacillin-tazobactam (ZOSYN) IVPB 3.375 g     3.375 g 100 mL/hr over 30 Minutes Intravenous  Once 02/12/17 0803 02/12/17 0925       Assessment/Plan Post-op bleed S/P gastrectomy at outside hospital - S/P angioembolization phrenic artery - No anastomotic leak  appreciated on imaging  - SOFT diet Hematoma - possibly infected - WBC 11.5, afebrile  - continue IV abx Anemia - Hb 8.7, stable   FEN- Full liquids, ensure, IV protonix VTE- SCDs, no lovenox  ID- zosyn (7/5>>), vanco (7/5>>), anidulafungin (7/5>>)  Plan: continue SOFT diet and PPI.   LOS: 6 days    Wells GuilesKelly Rayburn , [facility] 02/17/2017, 10:44 AM Pager: [phone] Consults: [phone] Mon-Fri 7:00 am-4:30 pm Sat-Sun 7:00 am-11:30 am

## 2017-02-17 NOTE — Progress Notes (Signed)
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  He had done well post op until 7/4 when he had large amount of hematemesis and reported to the ED.  on arrival he was hypotensive, tachycardic and guaiac positive.  He was found to have  rupture  and acute extravasation from the inferior left phrenic artery. He underwent a total of 6 units of prbc transfusion during his hospitalization so far, underwent left inferior phrenic artery and remnant of left gastric artery embolization BY IR on 7/5, and transferred to ICU. He was also found to have intra abdominal hematoma from the above. Surgery consulted recommended UGI , showed No evidence of obstruction or leak status post partial gastrectomy and bypass.   GI consulted , recommended no role for EGD.   He was transferred to [facility] service on the 7/7.on the night of 7/8 pt had an episode of left quadrant pain and altered mental status. CT  Angio of the abd pelvis ordered, did not show evidence of active arterial bleeding by CT angiography after coil embolization of the left gastric artery and left inferior phrenic artery. He was started on clear liquids and advanced diet to soft diet. He is tolerating soft diet.  Jeffrey Crane's family requesting for a repeat CT abdomen and pelvis prior to discharge.   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  Underwent  embolization of the left inferior phrenic artery. Bowel movements today, and [place] in color.  Started on diet able to tolerate soft diet without any vomiting.  Remains nauseated.  Surgery following.    Intraabdominal hematoma:  From the above. Some left quadrant abdominal pain resolved with pain medications.  Empiric antibiotics to be continued.    Acute blood loss anemia;  S/p 6 units of prbc transfusion.  Transfuse to keep hemoglobin greater than 7.    Severe reflux s/p sub total gastrectomy last month: Surgery following.  Resume protonix.    Left atrial appendage thrombus:  Coumadin on hold in view of the above.    Copd: no wheezing heard.   Severe malnutrition;  From the illness above.         DVT prophylaxis: scd's Code Status: full code.  Family Communication: family at bedside.  Disposition Plan: remain SDU, possibly home after working with pt.    Consultants:   Surgery   GI  IR  PCCM   Procedures: IR embolization of the left inferior phrenic artery.   Antimicrobials: VANCOMYCIN, ZOSYN,  ANIDULAFUNGIN SINCE ADMISSION.    Subjective: Some left quadrant pain occasionally. Nauseated.  No vomiting.  No diarrhea.  No chest pain or sob.   Objective: Vitals:   02/17/17 0600 02/17/17 0700 02/17/17 0713 02/17/17 1204  BP: 108/69 104/81 104/81 106/73  Pulse: 96 (!) 30 93 91  Resp: 18 (!) 21 20 17   Temp:   98.5 F (36.9 C) 98.2 F (36.8 C)  TempSrc:   Oral Oral  SpO2: 100% 90% 98% 96%  Weight:      Height:        Intake/Output Summary (Last 24 hours) at 02/17/17 1428 Last data filed at 02/17/17 0300  Gross per 24 hour  Intake            557.5 ml  Output              550 ml  Net              7.5 ml   Filed Weights   02/12/17 0155 02/14/17 1630 02/14/17 1700  Weight: 52.1 kg (114 lb 13.8 oz) 58.6 kg (129 lb 1.6 oz) 57.9 kg (127 lb 11.2 oz)    Examination:  General exam: Appears calm and comfortable  Respiratory system: Clear to  auscultation. Respiratory effort normal. Cardiovascular system: S1 & S2 heard, RRR. No JVD, murmurs, rubs, gallops or clicks. No pedal edema. Gastrointestinal system: Abdomen is soft ,mildly tender in the llq, midline incision healing good, no signs of infection.  Central nervous system: Alert and oriented. No focal neurological deficits. Extremities: Symmetric 5 x 5 power. Skin: No rashes, lesions or ulcers Psychiatry: Judgement and insight appear normal. Mood & affect appropriate.     Data Reviewed: I have personally reviewed following labs and imaging studies  CBC:  Recent Labs Lab 02/11/17 1950  02/15/17 0145 02/15/17 1600 02/16/17 0550 02/16/17 2040 02/17/17 0430  WBC 10.7*  < > 13.4* 11.6* 9.8 8.5 11.5*  NEUTROABS 8.7*  --   --   --   --   --   --   HGB 6.8*  < > 7.7* 8.0* 7.2* 8.0* 8.7*  HCT 22.3*  < > 23.6* 25.3* 22.8* 25.7* 27.8*  MCV 84.8  < > 86.1 86.9 88.4 87.7 87.7  PLT 539*  < > 212 234 249 277 287  < > = values in this interval not displayed. Basic Metabolic Panel:  Recent Labs Lab 02/14/17 0421 02/15/17 0145 02/15/17 1937 02/16/17 0550 02/17/17 0430  NA 136 136 137 140 139  K 3.7 3.5 3.4* 3.3* 3.8  CL 111 111 107 112* 110  CO2 19* 20* 23 23 22   GLUCOSE 101* 86 91 59* 94  BUN 14 10 8 8  5*  CREATININE 0.79 0.72 0.76 0.71 0.56*  CALCIUM 7.7* 7.5* 7.7* 7.1* 7.8*  MG 1.8  --   --   --   --   PHOS 2.4*  --   --   --   --    GFR: Estimated Creatinine Clearance: 83.4 mL/min (A) (by C-G formula based on SCr of 0.56 mg/dL (L)). Liver Function Tests:  Recent Labs Lab 02/11/17 1950 02/15/17 0145 02/15/17 1937  AST 11* 17 21  ALT 8* 9* 11*  ALKPHOS 67 61 76  BILITOT 0.3 1.0 1.0  PROT 5.2* 4.5* 5.1*  ALBUMIN 2.2* 1.7* 1.8*   No results for input(s): LIPASE, AMYLASE in the last 168 hours. No results for input(s): AMMONIA in the last 168 hours. Coagulation Profile:  Recent Labs Lab 02/12/17 0646 02/13/17 0352 02/14/17 0421 02/15/17 0145  02/16/17 0550  INR 1.26 1.39 1.49 1.34 1.26   Cardiac Enzymes: No results for input(s): CKTOTAL, CKMB, CKMBINDEX, TROPONINI in the last 168 hours. BNP (last 3 results) No results for input(s): PROBNP in the last 8760 hours. HbA1C: No results for input(s): HGBA1C in the last 72 hours. CBG:  Recent Labs Lab 02/14/17 1126 02/14/17 1638 02/16/17 1610  02/16/17 0753 02/16/17 1121  GLUCAP 91 79 31* 98 131*   Lipid Profile: No results for input(s): CHOL, HDL, LDLCALC, TRIG, CHOLHDL, LDLDIRECT in the last 72 hours. Thyroid Function Tests: No results for input(s): TSH, T4TOTAL, FREET4, T3FREE, THYROIDAB in the last 72 hours. Anemia Panel: No results for input(s): VITAMINB12, FOLATE, FERRITIN, TIBC, IRON, RETICCTPCT in the last 72 hours. Sepsis Labs:  Recent Labs Lab 02/12/17 1200 02/12/17 1202 02/15/17 1930  PROCALCITON  --  11.24  --   LATICACIDVEN 1.5  --  0.5    Recent Results (from the past 240 hour(s))  MRSA PCR Screening     Status: Abnormal   Collection Time: 02/12/17  1:58 AM  Result Value Ref Range Status   MRSA by PCR POSITIVE (A) NEGATIVE Final    Comment:        The GeneXpert MRSA Assay (FDA approved for NASAL specimens only), is one component of a comprehensive MRSA colonization surveillance program. It is not intended to diagnose MRSA infection nor to guide or monitor treatment for MRSA infections. RESULT CALLED TO, READ BACK BY AND VERIFIED WITH: PETTIFORD,A RN [phone] 02/12/17 MITCHELL,L   Culture, blood (Routine X 2) w Reflex to ID Panel     Status: None (Preliminary result)   Collection Time: 02/13/17  5:00 AM  Result Value Ref Range Status   Specimen Description BLOOD LEFT ANTECUBITAL  Final   Special Requests IN PEDIATRIC BOTTLE Blood Culture adequate volume  Final   Culture NO GROWTH 4 DAYS  Final   Report Status PENDING  Incomplete  Culture, blood (Routine X 2) w Reflex to ID Panel     Status: None (Preliminary result)   Collection Time: 02/13/17   5:05 AM  Result Value Ref Range Status   Specimen Description BLOOD RIGHT HAND  Final   Special Requests IN PEDIATRIC BOTTLE Blood Culture adequate volume  Final   Culture NO GROWTH 4 DAYS  Final   Report Status PENDING  Incomplete         Radiology Studies: No results found.      Scheduled Meds: . feeding supplement (ENSURE ENLIVE)  237 mL Oral BID BM  . gabapentin  300 mg Oral Daily  . LORazepam  0.5 mg Oral Q8H  . metoCLOPramide (REGLAN) injection  5 mg Intravenous Q6H  . pantoprazole  40 mg Oral BID AC  . polyethylene glycol  17 g Oral Daily  . senna-docusate  1 tablet Oral BID  . sodium chloride flush  10-40 mL Intracatheter Q12H   Continuous Infusions: . sodium chloride 75 mL/hr at 02/17/17 0631  . anidulafungin 100 mg (02/17/17 1340)  . piperacillin-tazobactam (ZOSYN)  IV Stopped (02/17/17 0920)  . vancomycin 1,000 mg (02/17/17 1200)     LOS: 6 days    Time spent: 45 minutes.     Kathlen ModyAKULA,Mable Dara, MD [facility] Pager [phone]  If 7PM-7AM, please contact night-coverage [email or web address] Password [email or web address] 02/17/2017, 2:28 PM

## 2017-02-18 LAB — COMPREHENSIVE METABOLIC PANEL WITH GFR
ALT: 5 U/L — ABNORMAL LOW (ref 17–63)
AST: 17 U/L (ref 15–41)
Albumin: 1.5 g/dL — ABNORMAL LOW (ref 3.5–5.0)
Alkaline Phosphatase: 57 U/L (ref 38–126)
Anion gap: 4 — ABNORMAL LOW (ref 5–15)
BUN: 5 mg/dL — ABNORMAL LOW (ref 6–20)
CO2: 26 mmol/L (ref 22–32)
Calcium: 7.7 mg/dL — ABNORMAL LOW (ref 8.9–10.3)
Chloride: 107 mmol/L (ref 101–111)
Creatinine, Ser: 0.65 mg/dL (ref 0.61–1.24)
GFR calc Af Amer: >60 mL/min
GFR calc non Af Amer: >60 mL/min
Glucose, Bld: 95 mg/dL (ref 65–99)
Potassium: 3.4 mmol/L — ABNORMAL LOW (ref 3.5–5.1)
Sodium: 137 mmol/L (ref 135–145)
Total Bilirubin: 0.8 mg/dL (ref 0.3–1.2)
Total Protein: 4.3 g/dL — ABNORMAL LOW (ref 6.5–8.1)

## 2017-02-18 LAB — CBC
HCT: 25.3 % — ABNORMAL LOW (ref 39.0–52.0)
Hemoglobin: 7.8 g/dL — ABNORMAL LOW (ref 13.0–17.0)
MCH: 26.9 pg (ref 26.0–34.0)
MCHC: 30.8 g/dL (ref 30.0–36.0)
MCV: 87.2 fL (ref 78.0–100.0)
Platelets: 302 10*3/uL (ref 150–400)
RBC: 2.9 MIL/uL — ABNORMAL LOW (ref 4.22–5.81)
RDW: 15.4 % (ref 11.5–15.5)
WBC: 9 10*3/uL (ref 4.0–10.5)

## 2017-02-18 LAB — CULTURE, BLOOD (ROUTINE X 2)
Culture: NO GROWTH
Culture: NO GROWTH
Special Requests: ADEQUATE
Special Requests: ADEQUATE

## 2017-02-18 LAB — GLUCOSE, CAPILLARY: Glucose-Capillary: 93 mg/dL (ref 65–99)

## 2017-02-18 LAB — PHOSPHORUS: Phosphorus: 3 mg/dL (ref 2.5–4.6)

## 2017-02-18 LAB — MAGNESIUM: Magnesium: 1.8 mg/dL (ref 1.7–2.4)

## 2017-02-18 MED ORDER — PANTOPRAZOLE SODIUM 20 MG PO TBEC: 40.0000 mg | DELAYED_RELEASE_TABLET | Freq: Two times a day (BID) | ORAL | 0 refills | Status: AC

## 2017-02-18 MED ORDER — POTASSIUM CHLORIDE CRYS ER 20 MEQ PO TBCR
40.0000 meq | EXTENDED_RELEASE_TABLET | Freq: Once | ORAL | Status: AC
  Administered 2017-02-18: 40 meq via ORAL
  Filled 2017-02-18: qty 2

## 2017-02-18 MED ORDER — PREMIER PROTEIN SHAKE: 11.0000 [oz_av] | Freq: Three times a day (TID) | ORAL | 0 refills | Status: AC

## 2017-02-18 MED ORDER — CYCLOBENZAPRINE HCL 5 MG PO TABS: 5.0000 mg | ORAL_TABLET | Freq: Three times a day (TID) | ORAL | 0 refills | Status: AC | PRN

## 2017-02-18 NOTE — Progress Notes (Signed)
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  He had done well post op until 7/4 when he had large amount of hematemesis and reported to the ED.  on arrival he was hypotensive, tachycardic and guaiac positive.  He was found to have  rupture  and acute extravasation from the inferior left phrenic artery. He underwent a total of 6 units of prbc transfusion during his hospitalization so far, underwent left inferior phrenic artery and remnant of left gastric artery embolization BY IR on 7/5, and transferred to ICU. He was also found to have intra abdominal hematoma from the above. Surgery consulted recommended UGI , showed No evidence of obstruction or leak status post partial gastrectomy and bypass.   GI consulted , recommended no role for EGD.   [facility].on the night of 7/8 pt had an episode of left quadrant pain and altered mental status. CT  Angio of the abd pelvis ordered, did not show evidence of active arterial bleeding by CT angiography after coil embolization of the left gastric artery and left inferior phrenic artery. He was started on clear liquids and advanced diet to soft diet. He is tolerating soft diet.  Patient's family requesting for a repeat CT abdomen and pelvis prior to discharge.   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  Underwent  embolization of the left inferior phrenic artery. Bowel movements today, and [place] in color.   able to tolerate soft diet without any vomiting. Just dry heaving today Remains nauseated.  Surgery following recommended to stop the antibiotics and monitor.  Discussed the plan with the patient.    Intraabdominal hematoma:  From the above. Some left quadrant abdominal pain resolved with pain medications.  Watch him off antibiotics for 24 hours.    Acute blood loss anemia;  S/p 6 units of prbc transfusion.  Transfuse to keep hemoglobin greater than 7.  Currently hemoglobin at 7.8 slightl drop from 8.7   Severe reflux s/p sub total gastrectomy last month: Surgery following.  Resume protonix.    Left atrial appendage thrombus:  Coumadin on hold in view of the above.    Copd: no wheezing heard.   Severe malnutrition;  From the illness above.  Nutrition consulted .     Hypokalemia : replaced.    DVT prophylaxis: scd's Code Status: full code.  Family Communication: none at bedside on today exam.  Disposition Plan: remain SDU, possibly home after working with pt.    Consultants:   Surgery   GI  IR  PCCM   Procedures: IR embolization of the left inferior phrenic artery.   Antimicrobials: VANCOMYCIN, ZOSYN,  ANIDULAFUNGIN  Discontinued on 7/11   Subjective: 2 soft [place] BM Nauseated.  Left lower quadrant pain is improved.   Objective: Vitals:   02/17/17 1204 02/17/17 1952 02/17/17 2300 02/18/17 0257  BP: 106/73 112/70  107/70 117/68  Pulse: 91 (!) 110 (!) 102 99  Resp: 17 (!) 26 17 17   Temp: 98.2 F (36.8 C) 98.6 F (37 C) 98.5 F (36.9 C) 98.6 F (37 C)  TempSrc: Oral Oral Oral Oral  SpO2: 96% 90% 96% 98%  Weight:      Height:        Intake/Output Summary (Last 24 hours) at 02/18/17 1023 Last data filed at 02/18/17 0700  Gross per 24 hour  Intake             2765 ml  Output              700 ml  Net             2065 ml   Filed Weights    02/12/17 0155 02/14/17 1630 02/14/17 1700  Weight: 52.1 kg (114 lb 13.8 oz) 58.6 kg (129 lb 1.6 oz) 57.9 kg (127 lb 11.2 oz)    Examination:stable   General exam: Appears calm and comfortable  Respiratory system: Clear to auscultation. Respiratory effort normal. Cardiovascular system: S1 & S2 heard, RRR. No JVD, murmurs, rubs, gallops or clicks. No pedal edema. Gastrointestinal system: Abdomen is soft ,mildly tender in the llq improved, midline incision healing good, no signs of infection.  Central nervous system: Alert and oriented. No focal neurological deficits. Extremities: Symmetric 5 x 5 power. Skin: No rashes, lesions or ulcers Psychiatry: Judgement and insight appear normal. Mood & affect appropriate.     Data Reviewed: I have personally reviewed following labs and imaging studies  CBC:  Recent Labs Lab 02/11/17 1950  02/15/17 1600 02/16/17 0550 02/16/17 2040 02/17/17 0430 02/18/17 0400  WBC 10.7*  < > 11.6* 9.8 8.5 11.5* 9.0  NEUTROABS 8.7*  --   --   --   --   --   --   HGB 6.8*  < > 8.0* 7.2* 8.0* 8.7* 7.8*  HCT 22.3*  < > 25.3* 22.8* 25.7* 27.8* 25.3*  MCV 84.8  < > 86.9 88.4 87.7 87.7 87.2  PLT 539*  < > 234 249 277 287 302  < > = values in this interval not displayed. Basic Metabolic Panel:  Recent Labs Lab 02/14/17 0421 02/15/17 0145 02/15/17 1937 02/16/17 0550 02/17/17 0430 02/18/17 0400  NA 136 136 137 140 139 137  K 3.7 3.5 3.4* 3.3* 3.8 3.4*  CL 111 111 107 112* 110 107  CO2 19* 20* 23 23 22 26   GLUCOSE 101* 86 91 59* 94 95  BUN 14 10 8 8  5* 5*  CREATININE 0.79 0.72 0.76 0.71 0.56* 0.65  CALCIUM 7.7* 7.5* 7.7* 7.1* 7.8* 7.7*  MG 1.8  --   --   --   --  1.8  PHOS 2.4*  --   --   --   --  3.0   GFR: Estimated Creatinine Clearance: 83.4 mL/min (by C-G formula based on SCr of 0.65 mg/dL). Liver Function Tests:  Recent Labs Lab 02/11/17 1950 02/15/17 0145 02/15/17 1937 02/18/17 0400  AST 11* 17 21 17   ALT 8* 9* 11* 5*  ALKPHOS 67 61  76 57  BILITOT 0.3 1.0 1.0 0.8  PROT 5.2* 4.5* 5.1* 4.3*  ALBUMIN 2.2* 1.7* 1.8* 1.5*   No results for input(s): LIPASE, AMYLASE in the last 168 hours. No results for input(s): AMMONIA in the last 168 hours. Coagulation Profile:  Recent Labs Lab 02/12/17 0646 02/13/17 0352 02/14/17 0421 02/15/17 0145 02/16/17 0550  INR 1.26 1.39  1.49 1.34 1.26   Cardiac Enzymes: No results for input(s): CKTOTAL, CKMB, CKMBINDEX, TROPONINI in the last 168 hours. BNP (last 3 results) No results for input(s): PROBNP in the last 8760 hours. HbA1C: No results for input(s): HGBA1C in the last 72 hours. CBG:  Recent Labs Lab 02/14/17 1638 02/16/17 0659 02/16/17 0753 02/16/17 1121 02/18/17 0810  GLUCAP 79 31* 98 131* 93   Lipid Profile: No results for input(s): CHOL, HDL, LDLCALC, TRIG, CHOLHDL, LDLDIRECT in the last 72 hours. Thyroid Function Tests: No results for input(s): TSH, T4TOTAL, FREET4, T3FREE, THYROIDAB in the last 72 hours. Anemia Panel: No results for input(s): VITAMINB12, FOLATE, FERRITIN, TIBC, IRON, RETICCTPCT in the last 72 hours. Sepsis Labs:  Recent Labs Lab 02/12/17 1200 02/12/17 1202 02/15/17 1930  PROCALCITON  --  11.24  --   LATICACIDVEN 1.5  --  0.5    Recent Results (from the past 240 hour(s))  MRSA PCR Screening     Status: Abnormal   Collection Time: 02/12/17  1:58 AM  Result Value Ref Range Status   MRSA by PCR POSITIVE (A) NEGATIVE Final    Comment:        The GeneXpert MRSA Assay (FDA approved for NASAL specimens only), is one component of a comprehensive MRSA colonization surveillance program. It is not intended to diagnose MRSA infection nor to guide or monitor treatment for MRSA infections. RESULT CALLED TO, READ BACK BY AND VERIFIED WITH: PETTIFORD,A RN [phone] 02/12/17 MITCHELL,L   Culture, blood (Routine X 2) w Reflex to ID Panel     Status: None (Preliminary result)   Collection Time: 02/13/17  5:00 AM  Result Value Ref Range Status    Specimen Description BLOOD LEFT ANTECUBITAL  Final   Special Requests IN PEDIATRIC BOTTLE Blood Culture adequate volume  Final   Culture NO GROWTH 4 DAYS  Final   Report Status PENDING  Incomplete  Culture, blood (Routine X 2) w Reflex to ID Panel     Status: None (Preliminary result)   Collection Time: 02/13/17  5:05 AM  Result Value Ref Range Status   Specimen Description BLOOD RIGHT HAND  Final   Special Requests IN PEDIATRIC BOTTLE Blood Culture adequate volume  Final   Culture NO GROWTH 4 DAYS  Final   Report Status PENDING  Incomplete         Radiology Studies: No results found.      Scheduled Meds: . gabapentin  300 mg Oral Daily  . LORazepam  0.5 mg Oral Q8H  . metoCLOPramide (REGLAN) injection  5 mg Intravenous Q6H  . pantoprazole  40 mg Oral BID AC  . polyethylene glycol  17 g Oral Daily  . potassium chloride  40 mEq Oral Once  . protein supplement shake  11 oz Oral TID BM  . senna-docusate  1 tablet Oral BID  . sodium chloride flush  10-40 mL Intracatheter Q12H   Continuous Infusions: . sodium chloride 75 mL/hr at 02/18/17 0800     LOS: 7 days    Time spent: 45 minutes.     Kathlen Mody, MD [facility] Pager [phone]  If 7PM-7AM, please contact night-coverage [email or web address] Password [email or web address] 02/18/2017, 10:23 AM

## 2017-02-18 NOTE — Progress Notes (Signed)
   Subjective/Chief Complaint: CC L abdominal pain a lot better, tol diet   Objective: Vital signs in last 24 hours: Temp:  [98.2 F (36.8 C)-98.6 F (37 C)] 98.6 F (37 C) (07/11 0257) Pulse Rate:  [91-110] 99 (07/11 0257) Resp:  [17-26] 17 (07/11 0257) BP: (106-117)/(68-73) 117/68 (07/11 0257) SpO2:  [90 %-98 %] 98 % (07/11 0257) Last BM Date: 02/16/17  Intake/Output from previous day: 07/10 0701 - 07/11 0700 In: 2225 [P.O.:480; I.V.:1315; IV Piggyback:430] Out: 700 [Urine:700] Intake/Output this shift: No intake/output data recorded.  General appearance: cooperative Resp: clear to auscultation bilaterally Cardio: regular rate and rhythm GI: soft, not sig tender on L or elsewhere, steris on wound  Lab Results:   Recent Labs  02/17/17 0430 02/18/17 0400  WBC 11.5* 9.0  HGB 8.7* 7.8*  HCT 27.8* 25.3*  PLT 287 302   BMET  Recent Labs  02/17/17 0430 02/18/17 0400  NA 139 137  K 3.8 3.4*  CL 110 107  CO2 22 26  GLUCOSE 94 95  BUN 5* 5*  CREATININE 0.56* 0.65  CALCIUM 7.8* 7.7*   PT/INR  Recent Labs  02/16/17 0550  LABPROT 15.9*  INR 1.26   ABG  Recent Labs  02/15/17 1940  PHART 7.270*  HCO3 21.9    Studies/Results: No results found.  Anti-infectives: Anti-infectives    Start     Dose/Rate Route Frequency Ordered Stop   02/17/17 1100  vancomycin (VANCOCIN) IVPB 1000 mg/200 mL premix     1,000 mg 200 mL/hr over 60 Minutes Intravenous Every 12 hours 02/16/17 2224     02/13/17 1200  anidulafungin (ERAXIS) 100 mg in sodium chloride 0.9 % 100 mL IVPB     100 mg 78 mL/hr over 100 Minutes Intravenous Every 24 hours 02/12/17 1053     02/12/17 2300  vancomycin (VANCOCIN) 1,500 mg in sodium chloride 0.9 % 500 mL IVPB  Status:  Discontinued     1,500 mg 250 mL/hr over 120 Minutes Intravenous Every 24 hours 02/12/17 2214 02/16/17 2226   02/12/17 1400  piperacillin-tazobactam (ZOSYN) IVPB 3.375 g     3.375 g 12.5 mL/hr over 240 Minutes  Intravenous Every 8 hours 02/12/17 0805     02/12/17 1200  anidulafungin (ERAXIS) 200 mg in sodium chloride 0.9 % 200 mL IVPB     200 mg 78 mL/hr over 200 Minutes Intravenous  Once 02/12/17 1053 02/12/17 1542   02/12/17 0815  piperacillin-tazobactam (ZOSYN) IVPB 3.375 g     3.375 g 100 mL/hr over 30 Minutes Intravenous  Once 02/12/17 0803 02/12/17 0925      Assessment/Plan: Post-op bleed S/P gastrectomy at outside hospital - S/P angioembolization phrenic artery - No anastomotic leak appreciated on imaging  - Softdiet Hematoma - possibly infected - WBC 9, afebrile  - rec stop ABX  LOS: 7 days    Rogene Meth E 02/18/2017

## 2017-02-18 NOTE — Progress Notes (Signed)
Discharged home by wheelchair stable accompanied by wife.discharge instructions given to pt.belongings taken home.

## 2017-02-19 NOTE — Discharge Summary (Signed)
Physician Discharge Summary  YAVIER SNIDER BPZ:[id number] DOB: 1961/03/16 DOA: 02/11/2017  PCP: Patient, No Pcp Per  Admit date: 02/11/2017 Discharge date: 02/18/2017  Admitted From: Home Disposition:  Home  Recommendations for Outpatient Follow-up:  1. Follow up with PCP in 1-2 weeks 2. Please obtain BMP/CBC in one week Please follow up  SURGERY AS NEEDED.   Discharge Condition: stable CODE STATUS: full code.  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 He had done well post op until 7/4 when he had large amount of hematemesis and reported to the ED. on arrival he was hypotensive, tachycardic and guaiac positive.  He was found to have  rupture  and acute extravasation from the inferior left phrenic artery. He underwent a total of 6 units of prbc transfusion during his hospitalization so far, underwent left inferior phrenic artery and remnant of left gastric artery embolization BY IR on 7/5, and transferred to ICU. He was also found to have intra abdominal hematoma from the above. Surgery consulted recommended UGI , showed No evidence of obstruction or leak status post partial gastrectomy and bypass.   GI consulted , recommended no role for EGD.   He was transferred to [facility] service on the 7/7.on the night of 7/8 pt had an episode of left quadrant pain and altered mental status. CT  Angio of the abd pelvis ordered, did not show evidence of active arterial bleeding by CT angiography after coil embolization of the left gastric artery and left inferior phrenic artery. He was started on clear liquids and advanced diet to soft diet. He is tolerating soft diet.   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  Underwent embolization of the left inferior phrenic artery. Bowel movements today, and [place] in color.   able to tolerate soft diet without any vomiting.  Remains nauseated.  Surgery following recommended to stop the antibiotics and monitor.  Discussed the plan with the patient.  Patient wanted to go home and follow up with his surgeon in [place].    Intra abdominal hematoma:  From the above. Some left quadrant abdominal pain resolved with pain medications.  Monitored off antibiotics.    Acute blood loss anemia;  S/p 6 units of prbc transfusion.  Transfuse to keep hemoglobin greater than 7.  Currently hemoglobin at 7.8 . Recommend outpatient follow up of hemoglobin.    Severe reflux s/p sub total gastrectomy last month:  Resume protonix.    Left atrial appendage thrombus:  Coumadin on hold in view of the above.    Copd: no wheezing heard.   Severe malnutrition;  From the illness above.  Nutrition consulted .     Hypokalemia : replaced.     Discharge Instructions  Discharge Instructions    Call MD for:  difficulty breathing, headache or visual disturbances    Complete by:  As directed    Call MD for:  extreme fatigue    Complete by:  As directed    Call MD for:  persistant dizziness or light-headedness    Complete by:  As directed    Call MD for:  persistant nausea and vomiting    Complete by:  As directed    Call MD for:  redness, tenderness, or signs of infection (pain, swelling, redness, odor or green/yellow discharge around incision site)    Complete by:  As directed    Call MD for:  severe uncontrolled pain    Complete by:  As directed    Call MD for:  temperature >100.4    Complete by:  As  directed    Diet - low sodium heart healthy    Complete by:  As directed    Discharge instructions    Complete by:  As directed    Follow up with your surgeon in [place].     Allergies as of 02/18/2017      Reactions   Famciclovir Rash   Aspirin Other (See Comments)   Bleeding ulcer   Avelox [moxifloxacin Hcl In Nacl] Hives   Chlorhexidine Gluconate Hives      Medication List    TAKE these medications   albuterol 108 (90 Base) MCG/ACT inhaler Commonly known as:  PROVENTIL HFA;VENTOLIN HFA Inhale 2 puffs into the lungs every 6 (six) hours as needed. For shortness of breath   albuterol 1.25 MG/3ML nebulizer solution Commonly known as:  ACCUNEB Take 1 ampule by nebulization every 6 (six) hours as needed. For shortness of breath   cyclobenzaprine 5 MG tablet Commonly known as:  FLEXERIL Take 1 tablet (5 mg total) by mouth 3 (three) times daily as needed for muscle spasms. What changed:  medication strength  how much to take  reasons to take this  additional instructions   fluticasone 50 MCG/ACT nasal spray Commonly known as:  FLONASE Place 2 sprays into the nose 2 (two) times daily.   gabapentin 300 MG capsule Commonly known as:  NEURONTIN Take 300 mg by mouth daily.   HYDROcodone-acetaminophen 10-325 MG tablet Commonly known as:  NORCO Take 1 tablet by mouth every 6 (six) hours as needed. For pain   LORazepam 0.5 MG tablet Commonly known as:  ATIVAN Take 0.5 mg by mouth every 8 (eight) hours. For anxiety   pantoprazole 20 MG tablet Commonly known as:  PROTONIX Take 2 tablets (40 mg total) by mouth 2 (two) times daily before a meal. What changed:  how much to take  when to take this   promethazine 25 MG tablet Commonly known as:  PHENERGAN Take 25 mg by mouth every 6 (six) hours as needed. For nausea   protein supplement shake Liqd Commonly known as:  PREMIER PROTEIN Take 325 mLs (11 oz total) by mouth 3 (three) times daily between meals.   SPIRIVA  RESPIMAT 1.25 MCG/ACT Aers Generic drug:  Tiotropium Bromide Monohydrate Inhale 2 puffs into the lungs daily.       Allergies  Allergen Reactions  . Famciclovir Rash  . Aspirin Other (See Comments)    Bleeding ulcer  . Avelox [Moxifloxacin Hcl In Nacl] Hives  . Chlorhexidine Gluconate Hives    Consultations:  Surgery    Procedures/Studies: Ct Abdomen Pelvis W Contrast  Addendum Date: 02/13/2017   ADDENDUM REPORT: 02/13/2017 09:36 ADDENDUM: Subsequent to the digital angiogram and discussion with Dr. Pascal Lux of interventional radiology, CT findings are felt to be related to rupture and acute extravasation from the left inferior phrenic artery. The appearance of the spleen is likely related to mass effect and hypotension, rather than  splenic rupture. The pseudoaneurysm formation within the left inferior phrenic artery may be secondary to gastric surgical dehiscence with extravasation of gastric contents into the left upper quadrant. Electronically Signed   By: Abigail Miyamoto M.D.   On: 02/13/2017 09:36   Result Date: 02/13/2017 CLINICAL DATA:  Left-sided pain and hemoptysis since yesterday. Gastroparesis surgery 2 weeks ago. GI bleeding. EXAM: CT ABDOMEN AND PELVIS WITH CONTRAST TECHNIQUE: Multidetector CT imaging of the abdomen and pelvis was performed using the standard protocol following bolus administration of intravenous contrast. CONTRAST:  113m ISOVUE-300 IOPAMIDOL (ISOVUE-300) INJECTION 61% COMPARISON:  Plain films of earlier today.  No prior CT. FINDINGS: Lower chest: Minimal motion degradation at the lung bases. Subsegmental atelectasis at the left lung base. Normal heart size with right coronary artery atherosclerosis. Trace right and small left pleural effusion. Hepatobiliary: Normal liver. Cholecystectomy, without biliary ductal dilatation. Pancreas: Normal, without mass or ductal dilatation. Pancreas is displaced anteriorly by left upper quadrant blood. Spleen: Splenic rupture, as  evidenced by perisplenic hemorrhage and areas of hypoattenuation throughout the spleen. Active extravasation of large volume left upper quadrant hemorrhage, likely from the splenic artery. Example image 16/series 3 and on delayed images. Adrenals/Urinary Tract: Normal adrenal glands. Heterogeneous enhancement involves both kidneys, most apparent on delayed images. No hydronephrosis. Normal urinary bladder. Stomach/Bowel: Surgical changes about the proximal stomach. The stomach is difficult to delineate from the surrounding hemorrhage. Suspicion of direct extension of blood into the stomach, including on image 17/series 3. No gastric obstruction. Normal colon and terminal ileum. Normal small bowel caliber. Prior enterotomy. No free intraperitoneal air. Vascular/Lymphatic: Aortic and branch vessel atherosclerosis. No abdominopelvic adenopathy. Reproductive: Normal prostate. Other: Relatively small volume pelvic fluid with mild complexity, likely hemorrhage. Moderate volume intraperitoneal and retroperitoneal hemorrhage throughout the abdomen. Fluid containing periumbilical hernia. Musculoskeletal: Bilateral femoral head avascular necrosis. Posterior fixation at L4-5. IMPRESSION: 1. Splenic rupture with large volume active extravasation throughout the left upper quadrant, likely arising from distal splenic artery branches. 2. Apparent communication of hemorrhage into the stomach. 3. Bilateral heterogeneous renal enhancement may relate to pyelonephritis. 4. Tiny bilateral pleural effusions. 5. Bilateral femoral head avascular necrosis. Critical test results telephoned to.Dr. YNelda Marseille at the time of interpretation at 2:48 p.m.on 02/12/2017. Electronically Signed: By: KAbigail MiyamotoM.D. On: 02/12/2017 14:57   Ir Angiogram Visceral Selective  Result Date: 02/13/2017 INDICATION: Recent history of near total gast[place] with markedly abnormal CT scan with active extravasation  within left upper abdominal quadrant. Please perform mesenteric arteriogram and potential percutaneous coil embolization. EXAM: 1. ULTRASOUND GUIDANCE FOR ARTERIAL ACCESS 2. SELECTIVE CELIAC ARTERIOGRAM 3. SELECTIVE LEFT GASTRIC REMNANT ARTERIOGRAM AND PERCUTANEOUS COIL EMBOLIZATION. 4. SELECTIVE LEFT INFERIOR PHRENIC ARTERIOGRAM AND PERCUTANEOUS COIL EMBOLIZATION. 5. SELECTIVE SPLENIC ARTERIOGRAM MEDICATIONS: None ANESTHESIA/SEDATION: Moderate (conscious) sedation was employed during this procedure. A total of Versed 5 mg and Fentanyl 100 mcg was administered intravenously. Moderate Sedation Time: 105 minutes. The patient's level of consciousness and vital signs were monitored continuously by radiology nursing throughout the procedure under my direct supervision. CONTRAST:  80 cc Isovue 300 FLUOROSCOPY TIME:  Fluoroscopy Time: 14 minutes 18 seconds (693 mGy). COMPLICATIONS: None immediate. PROCEDURE: Informed consent was obtained from the patient and the patient's family following explanation of the procedure, risks, benefits and alternatives. The patient understands, agrees and consents for the procedure. All questions were addressed. A time out was performed prior to the initiation of the procedure. Maximal barrier sterile technique utilized including caps, mask, sterile  gowns, sterile gloves, large sterile drape, hand hygiene, and Betadine prep. The right femoral head was marked fluoroscopically. Under ultrasound guidance, the right common femoral artery was accessed with a micropuncture kit after the overlying soft tissues were anesthetized with 1% lidocaine. An ultrasound image was saved for documentation purposes. The micropuncture sheath was exchanged for a 5 [place] vascular sheath over a Bentson wire. A closure arteriogram was performed through the side of the sheath confirming access within the right common femoral artery. Over a Bentson wire, a Mickelson catheter was advanced to the level of the thoracic  aorta where it was back bled and flushed. The catheter was then utilized to select the celiac artery and a selective celiac arteriogram was performed. Mickelson catheter was slowly retracted and utilized select the left gastric artery. Contrast injection confirmed the atretic appearance of this residual vessel. Next, a Fathom 14 micro wire was utilized to advance a regular Renegade microcatheter into the mid aspect of the remnant of this vessel. Contrast injection confirmed appropriate positioning. The residual left gastric artery was then subsequently embolized with multiple overlapping 3 and 2 mm interlock coils to near the level of its origin. Post embolization arteriogram images were obtained. Next, the [facility] catheter was utilized to select the left inferior phrenic artery. Again, the Fathom 14 micro wire was utilized to advance a regular Renegade microcatheter into the distal aspect of the left inferior phrenic artery, beyond the location of the pseudoaneurysm and active extravasation. The left inferior phrenic artery was then percutaneously coil embolized with multiple overlapping 3 and 4 mm diameter interlock coils across the area of pseudoaneurysm and active extravasation. Post embolization arteriogram images were obtained. Again, the Fathom 14 micro wire was utilized to advance a regular Renegade microcatheter into the distal aspect of the splenic artery. Several splenic arteriograms were performed both from the distal and mid aspects of the splenic artery. Images were reviewed and the procedure was terminated. All wires, catheters and sheaths were removed from the patient. Hemostasis was achieved at the right groin access site with deployment of an Exoseal closure device and manual compression. A dressing was placed. The patient tolerated the above procedure well without immediate postprocedural complication. FINDINGS: Initial celiac arteriogram demonstrates a conventional branching pattern, however  note diffuse vasospasm was noted throughout the celiac arterial system secondary to patient's hypovolemic state. Contrast injection demonstrates the origin of a residual, previously surgically ligated left gastric artery. Given concern for active extravasation from the gastric remnant as well as the diffuse vasospasm, this vessel was subsequently percutaneously coil embolized the level of the vessels origin. Post embolization arteriogram images demonstrate complete occlusion of this residual vessel. Contrast injection of the left inferior phrenic artery demonstrates active extravasation and pseudoaneurysm formation from its mid aspect, findings compatible with area of active extravasation demonstrated on preceding abdominal CT (coronal images 46 - 51, series 6). The inferior phrenic artery was percutaneously coil embolized across the level of pseudoaneurysm and active extravasation. Post embolization arteriogram images demonstrate complete occlusion of the left inferior phrenic artery without residual extravasation. Dedicated splenic arteriograms were negative for discrete area of vessel irregularity or contrast extravasation. As such, embolization of the splenic artery was not performed at this time. IMPRESSION: 1. Technically successful percutaneous coil embolization of the left inferior phrenic artery for active extravasation. 2. Technically successful prophylactic percutaneous coil embolization of the remnant left gastric artery. 3. No definite areas of active extravasation or vessel irregularity are identified arising from the splenic artery. As such,  splenic embolization was not performed at this time. Above findings discussed with Dr. Janann Colonel (critical care) at the time of procedure completion. Electronically Signed   By: Sandi Mariscal M.D.   On: 02/13/2017 08:20   Ir Angiogram Selective Each Additional Vessel  Result Date: 02/13/2017 INDICATION: Recent history of near total gastrectomy for gastroparesis at  institution in [place], now with markedly abnormal CT scan with active extravasation within left upper abdominal quadrant. Please perform mesenteric arteriogram and potential percutaneous coil embolization. EXAM: 1. ULTRASOUND GUIDANCE FOR ARTERIAL ACCESS 2. SELECTIVE CELIAC ARTERIOGRAM 3. SELECTIVE LEFT GASTRIC REMNANT ARTERIOGRAM AND PERCUTANEOUS COIL EMBOLIZATION. 4. SELECTIVE LEFT INFERIOR PHRENIC ARTERIOGRAM AND PERCUTANEOUS COIL EMBOLIZATION. 5. SELECTIVE SPLENIC ARTERIOGRAM MEDICATIONS: None ANESTHESIA/SEDATION: Moderate (conscious) sedation was employed during this procedure. A total of Versed 5 mg and Fentanyl 100 mcg was administered intravenously. Moderate Sedation Time: 105 minutes. The patient's level of consciousness and vital signs were monitored continuously by radiology nursing throughout the procedure under my direct supervision. CONTRAST:  80 cc Isovue 300 FLUOROSCOPY TIME:  Fluoroscopy Time: 14 minutes 18 seconds (693 mGy). COMPLICATIONS: None immediate. PROCEDURE: Informed consent was obtained from the patient and the patient's family following explanation of the procedure, risks, benefits and alternatives. The patient understands, agrees and consents for the procedure. All questions were addressed. A time out was performed prior to the initiation of the procedure. Maximal barrier sterile technique utilized including caps, mask, sterile gowns, sterile gloves, large sterile drape, hand hygiene, and Betadine prep. The right femoral head was marked fluoroscopically. Under ultrasound guidance, the right common femoral artery was accessed with a micropuncture kit after the overlying soft tissues were anesthetized with 1% lidocaine. An ultrasound image was saved for documentation purposes. The micropuncture sheath was exchanged for a 5 [place] vascular sheath over a Bentson wire. A closure arteriogram was performed through the side of the sheath confirming access within the right common femoral artery.  Over a Bentson wire, a Mickelson catheter was advanced to the level of the thoracic aorta where it was back bled and flushed. The catheter was then utilized to select the celiac artery and a selective celiac arteriogram was performed. Mickelson catheter was slowly retracted and utilized select the left gastric artery. Contrast injection confirmed the atretic appearance of this residual vessel. Next, a Fathom 14 micro wire was utilized to advance a regular Renegade microcatheter into the mid aspect of the remnant of this vessel. Contrast injection confirmed appropriate positioning. The residual left gastric artery was then subsequently embolized with multiple overlapping 3 and 2 mm interlock coils to near the level of its origin. Post embolization arteriogram images were obtained. Next, the [facility] catheter was utilized to select the left inferior phrenic artery. Again, the Fathom 14 micro wire was utilized to advance a regular Renegade microcatheter into the distal aspect of the left inferior phrenic artery, beyond the location of the pseudoaneurysm and active extravasation. The left inferior phrenic artery was then percutaneously coil embolized with multiple overlapping 3 and 4 mm diameter interlock coils across the area of pseudoaneurysm and active extravasation. Post embolization arteriogram images were obtained. Again, the Fathom 14 micro wire was utilized to advance a regular Renegade microcatheter into the distal aspect of the splenic artery. Several splenic arteriograms were performed both from the distal and mid aspects of the splenic artery. Images were reviewed and the procedure was terminated. All wires, catheters and sheaths were removed from the patient. Hemostasis was achieved at the right groin access site with deployment of  an Exoseal closure device and manual compression. A dressing was placed. The patient tolerated the above procedure well without immediate postprocedural complication. FINDINGS:  Initial celiac arteriogram demonstrates a conventional branching pattern, however note diffuse vasospasm was noted throughout the celiac arterial system secondary to patient's hypovolemic state. Contrast injection demonstrates the origin of a residual, previously surgically ligated left gastric artery. Given concern for active extravasation from the gastric remnant as well as the diffuse vasospasm, this vessel was subsequently percutaneously coil embolized the level of the vessels origin. Post embolization arteriogram images demonstrate complete occlusion of this residual vessel. Contrast injection of the left inferior phrenic artery demonstrates active extravasation and pseudoaneurysm formation from its mid aspect, findings compatible with area of active extravasation demonstrated on preceding abdominal CT (coronal images 46 - 51, series 6). The inferior phrenic artery was percutaneously coil embolized across the level of pseudoaneurysm and active extravasation. Post embolization arteriogram images demonstrate complete occlusion of the left inferior phrenic artery without residual extravasation. Dedicated splenic arteriograms were negative for discrete area of vessel irregularity or contrast extravasation. As such, embolization of the splenic artery was not performed at this time. IMPRESSION: 1. Technically successful percutaneous coil embolization of the left inferior phrenic artery for active extravasation. 2. Technically successful prophylactic percutaneous coil embolization of the remnant left gastric artery. 3. No definite areas of active extravasation or vessel irregularity are identified arising from the splenic artery. As such, splenic embolization was not performed at this time. Above findings discussed with Dr. Janann Colonel (critical care) at the time of procedure completion. Electronically Signed   By: Sandi Mariscal M.D.   On: 02/13/2017 08:20   Ir Angiogram Selective Each Additional Vessel  Result Date:  02/13/2017 INDICATION: Recent history of near total gastrectomy for gastroparesis at institution in [place], now with markedly abnormal CT scan with active extravasation within left upper abdominal quadrant. Please perform mesenteric arteriogram and potential percutaneous coil embolization. EXAM: 1. ULTRASOUND GUIDANCE FOR ARTERIAL ACCESS 2. SELECTIVE CELIAC ARTERIOGRAM 3. SELECTIVE LEFT GASTRIC REMNANT ARTERIOGRAM AND PERCUTANEOUS COIL EMBOLIZATION. 4. SELECTIVE LEFT INFERIOR PHRENIC ARTERIOGRAM AND PERCUTANEOUS COIL EMBOLIZATION. 5. SELECTIVE SPLENIC ARTERIOGRAM MEDICATIONS: None ANESTHESIA/SEDATION: Moderate (conscious) sedation was employed during this procedure. A total of Versed 5 mg and Fentanyl 100 mcg was administered intravenously. Moderate Sedation Time: 105 minutes. The patient's level of consciousness and vital signs were monitored continuously by radiology nursing throughout the procedure under my direct supervision. CONTRAST:  80 cc Isovue 300 FLUOROSCOPY TIME:  Fluoroscopy Time: 14 minutes 18 seconds (693 mGy). COMPLICATIONS: None immediate. PROCEDURE: Informed consent was obtained from the patient and the patient's family following explanation of the procedure, risks, benefits and alternatives. The patient understands, agrees and consents for the procedure. All questions were addressed. A time out was performed prior to the initiation of the procedure. Maximal barrier sterile technique utilized including caps, mask, sterile gowns, sterile gloves, large sterile drape, hand hygiene, and Betadine prep. The right femoral head was marked fluoroscopically. Under ultrasound guidance, the right common femoral artery was accessed with a micropuncture kit after the overlying soft tissues were anesthetized with 1% lidocaine. An ultrasound image was saved for documentation purposes. The micropuncture sheath was exchanged for a 5 [place] vascular sheath over a Bentson wire. A closure arteriogram was performed through  the side of the sheath confirming access within the right common femoral artery. Over a Bentson wire, a Mickelson catheter was advanced to the level of the thoracic aorta where it was back bled and flushed. The catheter  was then utilized to select the celiac artery and a selective celiac arteriogram was performed. Mickelson catheter was slowly retracted and utilized select the left gastric artery. Contrast injection confirmed the atretic appearance of this residual vessel. Next, a Fathom 14 micro wire was utilized to advance a regular Renegade microcatheter into the mid aspect of the remnant of this vessel. Contrast injection confirmed appropriate positioning. The residual left gastric artery was then subsequently embolized with multiple overlapping 3 and 2 mm interlock coils to near the level of its origin. Post embolization arteriogram images were obtained. Next, the [facility] catheter was utilized to select the left inferior phrenic artery. Again, the Fathom 14 micro wire was utilized to advance a regular Renegade microcatheter into the distal aspect of the left inferior phrenic artery, beyond the location of the pseudoaneurysm and active extravasation. The left inferior phrenic artery was then percutaneously coil embolized with multiple overlapping 3 and 4 mm diameter interlock coils across the area of pseudoaneurysm and active extravasation. Post embolization arteriogram images were obtained. Again, the Fathom 14 micro wire was utilized to advance a regular Renegade microcatheter into the distal aspect of the splenic artery. Several splenic arteriograms were performed both from the distal and mid aspects of the splenic artery. Images were reviewed and the procedure was terminated. All wires, catheters and sheaths were removed from the patient. Hemostasis was achieved at the right groin access site with deployment of an Exoseal closure device and manual compression. A dressing was placed. The patient tolerated  the above procedure well without immediate postprocedural complication. FINDINGS: Initial celiac arteriogram demonstrates a conventional branching pattern, however note diffuse vasospasm was noted throughout the celiac arterial system secondary to patient's hypovolemic state. Contrast injection demonstrates the origin of a residual, previously surgically ligated left gastric artery. Given concern for active extravasation from the gastric remnant as well as the diffuse vasospasm, this vessel was subsequently percutaneously coil embolized the level of the vessels origin. Post embolization arteriogram images demonstrate complete occlusion of this residual vessel. Contrast injection of the left inferior phrenic artery demonstrates active extravasation and pseudoaneurysm formation from its mid aspect, findings compatible with area of active extravasation demonstrated on preceding abdominal CT (coronal images 46 - 51, series 6). The inferior phrenic artery was percutaneously coil embolized across the level of pseudoaneurysm and active extravasation. Post embolization arteriogram images demonstrate complete occlusion of the left inferior phrenic artery without residual extravasation. Dedicated splenic arteriograms were negative for discrete area of vessel irregularity or contrast extravasation. As such, embolization of the splenic artery was not performed at this time. IMPRESSION: 1. Technically successful percutaneous coil embolization of the left inferior phrenic artery for active extravasation. 2. Technically successful prophylactic percutaneous coil embolization of the remnant left gastric artery. 3. No definite areas of active extravasation or vessel irregularity are identified arising from the splenic artery. As such, splenic embolization was not performed at this time. Above findings discussed with Dr. Janann Colonel (critical care) at the time of procedure completion. Electronically Signed   By: Sandi Mariscal M.D.   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 Please perform mesenteric arteriogram and potential percutaneous coil embolization. EXAM: 1. ULTRASOUND GUIDANCE FOR ARTERIAL ACCESS 2. SELECTIVE CELIAC ARTERIOGRAM 3. SELECTIVE LEFT GASTRIC REMNANT ARTERIOGRAM AND PERCUTANEOUS COIL EMBOLIZATION. 4. SELECTIVE LEFT INFERIOR  PHRENIC ARTERIOGRAM AND PERCUTANEOUS COIL EMBOLIZATION. 5. SELECTIVE SPLENIC ARTERIOGRAM MEDICATIONS: None ANESTHESIA/SEDATION: Moderate (conscious) sedation was employed during this procedure. A total of Versed 5 mg and Fentanyl 100 mcg was administered intravenously. Moderate Sedation Time: 105 minutes. The patient's level of consciousness and vital signs were monitored continuously by radiology nursing throughout the procedure under my direct supervision. CONTRAST:  80 cc Isovue 300 FLUOROSCOPY TIME:  Fluoroscopy Time: 14 minutes 18 seconds (693 mGy). COMPLICATIONS: None immediate. PROCEDURE: Informed consent was obtained from the patient and the patient's family following explanation of the procedure, risks, benefits and alternatives. The patient understands, agrees and consents for the procedure. All questions were addressed. A time out was performed prior to the initiation of the procedure. Maximal barrier sterile technique utilized including caps, mask, sterile gowns, sterile gloves, large sterile drape, hand hygiene, and Betadine prep. The right femoral head was marked fluoroscopically. Under ultrasound guidance, the right common femoral artery was accessed with a micropuncture kit after the overlying soft tissues were anesthetized with 1% lidocaine. An ultrasound image was saved for documentation purposes. The micropuncture sheath was exchanged for a 5  [place] vascular sheath over a Bentson wire. A closure arteriogram was performed through the side of the sheath confirming access within the right common femoral artery. Over a Bentson wire, a Mickelson catheter was advanced to the level of the thoracic aorta where it was back bled and flushed. The catheter was then utilized to select the celiac artery and a selective celiac arteriogram was performed. Mickelson catheter was slowly retracted and utilized select the left gastric artery. Contrast injection confirmed the atretic appearance of this residual vessel. Next, a Fathom 14 micro wire was utilized to advance a regular Renegade microcatheter into the mid aspect of the remnant of this vessel. Contrast injection confirmed appropriate positioning. The residual left gastric artery was then subsequently embolized with multiple overlapping 3 and 2 mm interlock coils to near the level of its origin. Post embolization arteriogram images were obtained. Next, the [facility] catheter was utilized to select the left inferior phrenic artery. Again, the Fathom 14 micro wire was utilized to advance a regular Renegade microcatheter into the distal aspect of the left inferior phrenic artery, beyond the location of the pseudoaneurysm and active extravasation. The left inferior phrenic artery was then percutaneously coil embolized with multiple overlapping 3 and 4 mm diameter interlock coils across the area of pseudoaneurysm and active extravasation. Post embolization arteriogram images were obtained. Again, the Fathom 14 micro wire was utilized to advance a regular Renegade microcatheter into the distal aspect of the splenic artery. Several splenic arteriograms were performed both from the distal and mid aspects of the splenic artery. Images were reviewed and the procedure was terminated. All wires, catheters and sheaths were removed from the patient. Hemostasis was achieved at the right groin access site with deployment of an  Exoseal closure device and manual compression. A dressing was placed. The patient tolerated the above procedure well without immediate postprocedural complication. FINDINGS: Initial celiac arteriogram demonstrates a conventional branching pattern, however note diffuse vasospasm was noted throughout the celiac arterial system secondary to patient's hypovolemic state. Contrast injection demonstrates the origin of a residual, previously surgically ligated left gastric artery. Given concern for active extravasation from the gastric remnant as well as the diffuse vasospasm, this vessel was subsequently percutaneously coil embolized the level of the vessels origin. Post embolization arteriogram images demonstrate complete occlusion of this residual vessel. Contrast injection of the left inferior phrenic artery demonstrates  active extravasation and pseudoaneurysm formation from its mid aspect, findings compatible with area of active extravasation demonstrated on preceding abdominal CT (coronal images 46 - 51, series 6). The inferior phrenic artery was percutaneously coil embolized across the level of pseudoaneurysm and active extravasation. Post embolization arteriogram images demonstrate complete occlusion of the left inferior phrenic artery without residual extravasation. Dedicated splenic arteriograms were negative for discrete area of vessel irregularity or contrast extravasation. As such, embolization of the splenic artery was not performed at this time. IMPRESSION: 1. Technically successful percutaneous coil embolization of the left inferior phrenic artery for active extravasation. 2. Technically successful prophylactic percutaneous coil embolization of the remnant left gastric artery. 3. No definite areas of active extravasation or vessel irregularity are identified arising from the splenic artery. As such, splenic embolization was not performed at this time. Above findings discussed with Dr. Janann Colonel (critical care)  at the time of procedure completion. Electronically Signed   By: Sandi Mariscal M.D.   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 Please perform mesenteric arteriogram and potential percutaneous coil embolization. EXAM: 1. ULTRASOUND GUIDANCE FOR ARTERIAL ACCESS 2. SELECTIVE CELIAC ARTERIOGRAM 3. SELECTIVE LEFT GASTRIC REMNANT ARTERIOGRAM AND PERCUTANEOUS COIL EMBOLIZATION. 4. SELECTIVE LEFT INFERIOR PHRENIC ARTERIOGRAM AND PERCUTANEOUS COIL EMBOLIZATION. 5. SELECTIVE SPLENIC ARTERIOGRAM MEDICATIONS: None ANESTHESIA/SEDATION: Moderate (conscious) sedation was employed during this procedure. A total of Versed 5 mg and Fentanyl 100 mcg was administered intravenously. Moderate Sedation Time: 105 minutes. The patient's level of consciousness and vital signs were monitored continuously by radiology nursing throughout the procedure under my direct supervision. CONTRAST:  80 cc Isovue 300 FLUOROSCOPY TIME:  Fluoroscopy Time: 14 minutes 18 seconds (693 mGy). COMPLICATIONS: None immediate. PROCEDURE: Informed consent was obtained from the patient and the patient's family following explanation of the procedure, risks, benefits and alternatives. The patient understands, agrees and consents for the procedure. All questions were addressed. A time out was performed prior to the initiation of the procedure. Maximal barrier sterile technique utilized including caps, mask, sterile gowns, sterile gloves, large sterile drape, hand hygiene, and Betadine prep. The right femoral head was marked fluoroscopically. Under ultrasound guidance, the right common femoral artery was accessed with a micropuncture kit after the overlying soft tissues were anesthetized with 1% lidocaine. An ultrasound image was saved for  documentation purposes. The micropuncture sheath was exchanged for a 5 [place] vascular sheath over a Bentson wire. A closure arteriogram was performed through the side of the sheath confirming access within the right common femoral artery. Over a Bentson wire, a Mickelson catheter was advanced to the level of the thoracic aorta where it was back bled and flushed. The catheter was then utilized to select the celiac artery and a selective celiac arteriogram was performed. Mickelson catheter was slowly retracted and utilized select the left gastric artery. Contrast injection confirmed the atretic appearance of this residual vessel. Next, a Fathom 14 micro wire was utilized to advance a regular Renegade microcatheter into the mid aspect of the remnant of this vessel. Contrast injection confirmed appropriate positioning. The residual left gastric artery was then subsequently embolized with multiple overlapping 3 and 2 mm interlock coils to near the level of its origin. Post embolization arteriogram images were obtained. Next, the [facility] catheter was utilized to select the left inferior phrenic artery.  Again, the Fathom 14 micro wire was utilized to advance a regular Renegade microcatheter into the distal aspect of the left inferior phrenic artery, beyond the location of the pseudoaneurysm and active extravasation. The left inferior phrenic artery was then percutaneously coil embolized with multiple overlapping 3 and 4 mm diameter interlock coils across the area of pseudoaneurysm and active extravasation. Post embolization arteriogram images were obtained. Again, the Fathom 14 micro wire was utilized to advance a regular Renegade microcatheter into the distal aspect of the splenic artery. Several splenic arteriograms were performed both from the distal and mid aspects of the splenic artery. Images were reviewed and the procedure was terminated. All wires, catheters and sheaths were removed from the patient. Hemostasis  was achieved at the right groin access site with deployment of an Exoseal closure device and manual compression. A dressing was placed. The patient tolerated the above procedure well without immediate postprocedural complication. FINDINGS: Initial celiac arteriogram demonstrates a conventional branching pattern, however note diffuse vasospasm was noted throughout the celiac arterial system secondary to patient's hypovolemic state. Contrast injection demonstrates the origin of a residual, previously surgically ligated left gastric artery. Given concern for active extravasation from the gastric remnant as well as the diffuse vasospasm, this vessel was subsequently percutaneously coil embolized the level of the vessels origin. Post embolization arteriogram images demonstrate complete occlusion of this residual vessel. Contrast injection of the left inferior phrenic artery demonstrates active extravasation and pseudoaneurysm formation from its mid aspect, findings compatible with area of active extravasation demonstrated on preceding abdominal CT (coronal images 46 - 51, series 6). The inferior phrenic artery was percutaneously coil embolized across the level of pseudoaneurysm and active extravasation. Post embolization arteriogram images demonstrate complete occlusion of the left inferior phrenic artery without residual extravasation. Dedicated splenic arteriograms were negative for discrete area of vessel irregularity or contrast extravasation. As such, embolization of the splenic artery was not performed at this time. IMPRESSION: 1. Technically successful percutaneous coil embolization of the left inferior phrenic artery for active extravasation. 2. Technically successful prophylactic percutaneous coil embolization of the remnant left gastric artery. 3. No definite areas of active extravasation or vessel irregularity are identified arising from the splenic artery. As such, splenic embolization was not performed at  this time. Above findings discussed with Dr. Janann Colonel (critical care) at the time of procedure completion. Electronically Signed   By: Sandi Mariscal M.D.   On: 02/13/2017 08:20   Dg Abd Portable 1v  Result Date: 02/12/2017 CLINICAL DATA:  ABDOMINAL PAIN,HX GASTRECTOMY IN [place] 6-18 TO 6-30.hematemesis. History prior GI bleeding FLAT PLATE ABDOMEN PER DR CORNETT.PT FOR CT SCAN TODAY EXAM: PORTABLE ABDOMEN - 1 VIEW COMPARISON:  Chest x-ray 11/23/2011 FINDINGS: There is dilatation of numerous small bowel loops in the central abdomen. Surgical clips are identified in the right upper quadrant. No evidence for free intraperitoneal air on this supine views provided. Status post lumbar fusion. IMPRESSION: Findings consistent with partial or early small bowel obstruction. Electronically Signed   By: Nolon Nations M.D.   On: 02/12/2017 09:32   Dg Duanne Limerick W/water Sol Cm  Result Date: 02/13/2017 CLINICAL DATA:  Upper gastrointestinal hemorrhage. Extravasation from the phrenic artery treated by coil embolization yesterday. EXAM: WATER SOLUBLE UPPER GI SERIES TECHNIQUE: Single-column upper GI series was performed using water soluble contrast. CONTRAST:  Oral Isovue-300, approximately 100 ml. COMPARISON:  CT and interventional studies from yesterday. FLUOROSCOPY TIME:  Fluoroscopy Time: 54 seconds of low-dose pulsed fluoro Radiation Exposure Index (if provided by  the fluoroscopic device): 13.7 mGy Number of Acquired Spot Images: 0 FINDINGS: The scout abdominal radiograph demonstrates embolization coils in the left upper quadrant. There is mild bowel distention and mild residual intraluminal contrast in the right lower quadrant. The patient swallowed the enteric contrast without difficulty. 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 No aspiration was observed. The gastroesophageal junction is patent. There is adequate filling of the gastric pouch. Underlying mucosal thickening and a filling defect in the pouch are difficult to exclude.  There is spontaneous drainage from the gastric pouch into the gastrojejunostomy. No extravasation demonstrated. IMPRESSION: 1. No evidence of obstruction or leak status post partial gastrectomy and bypass. 2. Limited mucosal assessment within the gastric pouch with possible mucosal thickening and/or intraluminal clot. Electronically Signed   By: Richardean Sale M.D.   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 Please perform mesenteric arteriogram and potential percutaneous coil embolization. EXAM: 1. ULTRASOUND GUIDANCE FOR ARTERIAL ACCESS 2. SELECTIVE CELIAC ARTERIOGRAM 3. SELECTIVE LEFT GASTRIC REMNANT ARTERIOGRAM AND PERCUTANEOUS COIL EMBOLIZATION. 4. SELECTIVE LEFT INFERIOR PHRENIC ARTERIOGRAM AND PERCUTANEOUS COIL EMBOLIZATION. 5. SELECTIVE SPLENIC ARTERIOGRAM MEDICATIONS: None ANESTHESIA/SEDATION: Moderate (conscious) sedation was employed during this procedure. A total of Versed 5 mg and Fentanyl 100 mcg was administered intravenously. Moderate Sedation Time: 105 minutes. The patient's level of consciousness and vital signs were monitored continuously by radiology nursing throughout the procedure under my direct supervision. CONTRAST:  80 cc Isovue 300 FLUOROSCOPY TIME:  Fluoroscopy Time: 14 minutes 18 seconds (693 mGy). COMPLICATIONS: None immediate. PROCEDURE: Informed consent was obtained from the patient and the patient's family following explanation of the procedure, risks, benefits and alternatives. The patient understands, agrees and consents for the procedure. All questions were addressed. A time out was performed prior to the initiation of the procedure. Maximal barrier sterile technique utilized including caps, mask, sterile gowns, sterile  gloves, large sterile drape, hand hygiene, and Betadine prep. The right femoral head was marked fluoroscopically. Under ultrasound guidance, the right common femoral artery was accessed with a micropuncture kit after the overlying soft tissues were anesthetized with 1% lidocaine. An ultrasound image was saved for documentation purposes. The micropuncture sheath was exchanged for a 5 [place] vascular sheath over a Bentson wire. A closure arteriogram was performed through the side of the sheath confirming access within the right common femoral artery. Over a Bentson wire, a Mickelson catheter was advanced to the level of the thoracic aorta where it was back bled and flushed. The catheter was then utilized to select the celiac artery and a selective celiac arteriogram was performed. Mickelson catheter was slowly retracted and utilized select the left gastric artery. Contrast injection confirmed the atretic appearance of this residual vessel. Next, a Fathom 14 micro wire was utilized to advance a regular Renegade microcatheter into the mid aspect of the remnant of this vessel. Contrast injection confirmed appropriate positioning. The residual left gastric artery was then subsequently embolized with multiple overlapping 3 and 2 mm interlock coils to near the level of its origin. Post embolization arteriogram images were obtained. Next, the [facility] catheter was utilized to select the left inferior phrenic artery. Again, the Fathom 14 micro wire was utilized to advance a regular Renegade microcatheter into the distal aspect of the left inferior phrenic artery, beyond  the location of the pseudoaneurysm and active extravasation. The left inferior phrenic artery was then percutaneously coil embolized with multiple overlapping 3 and 4 mm diameter interlock coils across the area of pseudoaneurysm and active extravasation. Post embolization arteriogram images were obtained. Again, the Fathom 14 micro wire was utilized to  advance a regular Renegade microcatheter into the distal aspect of the splenic artery. Several splenic arteriograms were performed both from the distal and mid aspects of the splenic artery. Images were reviewed and the procedure was terminated. All wires, catheters and sheaths were removed from the patient. Hemostasis was achieved at the right groin access site with deployment of an Exoseal closure device and manual compression. A dressing was placed. The patient tolerated the above procedure well without immediate postprocedural complication. FINDINGS: Initial celiac arteriogram demonstrates a conventional branching pattern, however note diffuse vasospasm was noted throughout the celiac arterial system secondary to patient's hypovolemic state. Contrast injection demonstrates the origin of a residual, previously surgically ligated left gastric artery. Given concern for active extravasation from the gastric remnant as well as the diffuse vasospasm, this vessel was subsequently percutaneously coil embolized the level of the vessels origin. Post embolization arteriogram images demonstrate complete occlusion of this residual vessel. Contrast injection of the left inferior phrenic artery demonstrates active extravasation and pseudoaneurysm formation from its mid aspect, findings compatible with area of active extravasation demonstrated on preceding abdominal CT (coronal images 46 - 51, series 6). The inferior phrenic artery was percutaneously coil embolized across the level of pseudoaneurysm and active extravasation. Post embolization arteriogram images demonstrate complete occlusion of the left inferior phrenic artery without residual extravasation. Dedicated splenic arteriograms were negative for discrete area of vessel irregularity or contrast extravasation. As such, embolization of the splenic artery was not performed at this time. IMPRESSION: 1. Technically successful percutaneous coil embolization of the left  inferior phrenic artery for active extravasation. 2. Technically successful prophylactic percutaneous coil embolization of the remnant left gastric artery. 3. No definite areas of active extravasation or vessel irregularity are identified arising from the splenic artery. As such, splenic embolization was not performed at this time. Above findings discussed with Dr. Janann Colonel (critical care) at the time of procedure completion. Electronically Signed   By: Sandi Mariscal M.D.   On: 02/13/2017 08:20   Ct Angio Abd/pel W/ And/or W/o  Result Date: 02/15/2017 CLINICAL DATA:  Status post transcatheter embolization of left gastric artery and its left inferior phrenic artery on 02/13/2015 for pseudoaneurysm formation and active extravasation of contrast material at the level of the inferior phrenic artery. This is near the level of prior gastrectomy. There has been drop in hemoglobin last night requiring transfusion. EXAM: CT ANGIOGRAPHY ABDOMEN AND PELVIS WITH CONTRAST AND WITHOUT CONTRAST TECHNIQUE: Multidetector CT imaging of the abdomen and pelvis was performed using the standard protocol during bolus administration of intravenous contrast. Multiplanar reconstructed images and MIPs were obtained and reviewed to evaluate the vascular anatomy. CONTRAST:  100 mL Isovue 370 IV COMPARISON:  CT of the abdomen and pelvis on 02/12/2017 as well as arteriographic images from the arteriogram and embolization procedure on the same date. FINDINGS: VASCULAR Aorta: Normal patency of the abdominal aorta without evidence of stenosis or aneurysm. Celiac: Normal patency of the celiac trunk. 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 Distal aspect of the inferior phrenic artery immediately below the diaphragm contains a series of embolization coils and is occluded. Just inferior to the inferior phrenic artery coils, there is a small  residual amount of higher density fluid which does not change on the venous phase and is felt  to represent a small amount of remaining high density fluid related to the large amount of extravasated vascular contrast seen on the prior CT prior to embolization. There is no further visualization of pseudoaneurysm or active extravasation of contrast by CTA. SMA: Normally patent. Renals: Widely patent bilateral single renal arteries. IMA: Normally patent inferior mesenteric artery. Inflow: Normally patent bilateral iliac arteries. Proximal Outflow: Normally patent bilateral common femoral arteries and femoral bifurcations. Veins: Venous phase imaging shows no evidence of mesenteric venous thrombus. The portal vein and splenic vein are normally patent. Review of the MIP images confirms the above findings. NON-VASCULAR Lower chest: Small a moderate left pleural effusion and small right pleural effusion. Associated left lower lobe atelectasis. Hepatobiliary: No focal liver abnormality is seen. Status post cholecystectomy. No biliary dilatation. Pancreas: Unremarkable. No pancreatic ductal dilatation or surrounding inflammatory changes. Spleen: Stable regular perfusion of the spleen without evidence of splenic hematoma or contrast extravasation. Adrenals/Urinary Tract: Adrenal glands are unremarkable. Kidneys are normal, without renal calculi, focal lesion, or hydronephrosis. Bladder is unremarkable. Stomach/Bowel: In the immediate subphrenic left upper quadrant, there is irregular air present, some of which is clearly extraluminal from the stomach and bowel status post prior gastrectomy. There does appear to be disruption along the posterior aspect of the gastric suture line. There remains some adjacent fluid in the subphrenic space. The recent upper GI and may not have fully delineated leak due to under-distention of the residual stomach and relatively contained leak. Other bowel shows no evidence of obstruction or perforation. Lymphatic: No enlarged lymph nodes identified. Reproductive: Prostate is unremarkable.  Other: No hernias identified. Musculoskeletal: No acute or significant osseous findings. IMPRESSION: VASCULAR No evidence of active arterial bleeding by CT angiography after coil embolization of the left gastric artery and left inferior phrenic artery. No further arterial pseudoaneurysm visualized. A small amount of residual high density fluid immediately inferior to the phrenic artery coils shows no change on arterial and venous phases and likely represents some residual high density fluid related to the large amount of extravasated intravascular contrast at the time of the prior CT study. NON-VASCULAR The current study is highly suspicious for disruption of the gastrectomy suture line posteriorly resulting in extraluminal air in the subphrenic space. This appears to be a relatively contained area of leakage but there clearly is some air present outside of the lumen of the stomach and small bowel in this region. There is some associated adjacent fluid in the left subphrenic space. These results were called by telephone at the time of interpretation on 02/15/2017 at 4:02 pm to Dr. Joette Catching , who verbally acknowledged these results. Electronically Signed   By: Aletta Edouard M.D.   On: 02/15/2017 16:02       Subjective: Wants to go home, very adamant.  No nausea, vomting, left lower quadrant pain improved.   Discharge Exam: Vitals:   02/18/17 1334 02/18/17 1636  BP: 110/75 114/88  Pulse: 93   Resp: 15   Temp: 98.9 F (37.2 C) 98.2 F (36.8 C)   Vitals:   02/17/17 2300 02/18/17 0257 02/18/17 1334 02/18/17 1636  BP: 107/70 117/68 110/75 114/88  Pulse: (!) 102 99 93   Resp: _0 Temp: 98.5 F (36.9 C) 98.6 F (37 C) 98.9 F (37.2 C) 98.2 F (36.8 C)  TempSrc: Oral Oral Oral Oral  SpO2: 96% 98% 94% 91%  Weight:      Height:        General: Pt is alert, awake, not in acute distress Cardiovascular: RRR, S1/S2 +, no rubs, no gallops Respiratory: CTA bilaterally, no wheezing,  no rhonchi Abdominal: Soft, NT, ND, bowel sounds + Extremities: no edema, no cyanosis    The results of significant diagnostics from this hospitalization (including imaging, microbiology, ancillary and laboratory) are listed below for reference.     Microbiology: Recent Results (from the past 240 hour(s))  MRSA PCR Screening     Status: Abnormal   Collection Time: 02/12/17  1:58 AM  Result Value Ref Range Status   MRSA by PCR POSITIVE (A) NEGATIVE Final    Comment:        The GeneXpert MRSA Assay (FDA approved for NASAL specimens only), is one component of a comprehensive MRSA colonization surveillance program. It is not intended to diagnose MRSA infection nor to guide or monitor treatment for MRSA infections. RESULT CALLED TO, READ BACK BY AND VERIFIED WITH: PETTIFORD,A RN [phone] 02/12/17 MITCHELL,L   Culture, blood (Routine X 2) w Reflex to ID Panel     Status: None   Collection Time: 02/13/17  5:00 AM  Result Value Ref Range Status   Specimen Description BLOOD LEFT ANTECUBITAL  Final   Special Requests IN PEDIATRIC BOTTLE Blood Culture adequate volume  Final   Culture NO GROWTH 5 DAYS  Final   Report Status 02/18/2017 FINAL  Final  Culture, blood (Routine X 2) w Reflex to ID Panel     Status: None   Collection Time: 02/13/17  5:05 AM  Result Value Ref Range Status   Specimen Description BLOOD RIGHT HAND  Final   Special Requests IN PEDIATRIC BOTTLE Blood Culture adequate volume  Final   Culture NO GROWTH 5 DAYS  Final   Report Status 02/18/2017 FINAL  Final     Labs: BNP (last 3 results) No results for input(s): BNP in the last 8760 hours. Basic Metabolic Panel:  Recent Labs Lab 02/14/17 0421 02/15/17 0145 02/15/17 1937 02/16/17 0550 02/17/17 0430 02/18/17 0400  NA 136 136 137 140 139 137  K 3.7 3.5 3.4* 3.3* 3.8 3.4*  CL 111 111 107 112* 110 107  CO2 19* 20* _0 GLUCOSE 101* 86 91 59* 94 95  BUN _1 5* 5*  CREATININE 0.79 0.72 0.76 0.71  0.56* 0.65  CALCIUM 7.7* 7.5* 7.7* 7.1* 7.8* 7.7*  MG 1.8  --   --   --   --  1.8  PHOS 2.4*  --   --   --   --  3.0   Liver Function Tests:  Recent Labs Lab 02/15/17 0145 02/15/17 1937 02/18/17 0400  AST _2 ALT 9* 11* 5*  ALKPHOS 61 76 57  BILITOT 1.0 1.0 0.8  PROT 4.5* 5.1* 4.3*  ALBUMIN 1.7* 1.8* 1.5*   No results for input(s): LIPASE, AMYLASE in the last 168 hours. No results for input(s): AMMONIA in the last 168 hours. CBC:  Recent Labs Lab 02/15/17 1600 02/16/17 0550 02/16/17 2040 02/17/17 0430 02/18/17 0400  WBC 11.6* 9.8 8.5 11.5* 9.0  HGB 8.0* 7.2* 8.0* 8.7* 7.8*  HCT 25.3* 22.8* 25.7* 27.8* 25.3*  MCV 86.9 88.4 87.7 87.7 87.2  PLT 234 249 277 287 302   Cardiac Enzymes: No results for input(s): CKTOTAL, CKMB, CKMBINDEX, TROPONINI in the last 168 hours. BNP: Invalid input(s): POCBNP CBG:  Recent Labs Lab  02/14/17 1638 02/16/17 0659 02/16/17 0753 02/16/17 1121 02/18/17 0810  GLUCAP 79 31* 98 131* 93   D-Dimer No results for input(s): DDIMER in the last 72 hours. Hgb A1c No results for input(s): HGBA1C in the last 72 hours. Lipid Profile No results for input(s): CHOL, HDL, LDLCALC, TRIG, CHOLHDL, LDLDIRECT in the last 72 hours. Thyroid function studies No results for input(s): TSH, T4TOTAL, T3FREE, THYROIDAB in the last 72 hours.  Invalid input(s): FREET3 Anemia work up No results for input(s): VITAMINB12, FOLATE, FERRITIN, TIBC, IRON, RETICCTPCT in the last 72 hours. Urinalysis    Component Value Date/Time   COLORURINE YELLOW 02/12/2017 2301   APPEARANCEUR CLEAR 02/12/2017 2301   LABSPEC 1.043 (H) 02/12/2017 2301   PHURINE 5.0 02/12/2017 2301   GLUCOSEU NEGATIVE 02/12/2017 2301   HGBUR NEGATIVE 02/12/2017 2301   BILIRUBINUR NEGATIVE 02/12/2017 2301   KETONESUR NEGATIVE 02/12/2017 2301   PROTEINUR NEGATIVE 02/12/2017 2301   UROBILINOGEN 1.0 10/02/2007 0532   NITRITE NEGATIVE 02/12/2017 2301   LEUKOCYTESUR NEGATIVE 02/12/2017  2301   Sepsis Labs Invalid input(s): PROCALCITONIN,  WBC,  LACTICIDVEN Microbiology Recent Results (from the past 240 hour(s))  MRSA PCR Screening     Status: Abnormal   Collection Time: 02/12/17  1:58 AM  Result Value Ref Range Status   MRSA by PCR POSITIVE (A) NEGATIVE Final    Comment:        The GeneXpert MRSA Assay (FDA approved for NASAL specimens only), is one component of a comprehensive MRSA colonization surveillance program. It is not intended to diagnose MRSA infection nor to guide or monitor treatment for MRSA infections. RESULT CALLED TO, READ BACK BY AND VERIFIED WITH: PETTIFORD,A RN [phone] 02/12/17 MITCHELL,L   Culture, blood (Routine X 2) w Reflex to ID Panel     Status: None   Collection Time: 02/13/17  5:00 AM  Result Value Ref Range Status   Specimen Description BLOOD LEFT ANTECUBITAL  Final   Special Requests IN PEDIATRIC BOTTLE Blood Culture adequate volume  Final   Culture NO GROWTH 5 DAYS  Final   Report Status 02/18/2017 FINAL  Final  Culture, blood (Routine X 2) w Reflex to ID Panel     Status: None   Collection Time: 02/13/17  5:05 AM  Result Value Ref Range Status   Specimen Description BLOOD RIGHT HAND  Final   Special Requests IN PEDIATRIC BOTTLE Blood Culture adequate volume  Final   Culture NO GROWTH 5 DAYS  Final   Report Status 02/18/2017 FINAL  Final     Time coordinating discharge: Over 30 minutes  SIGNED:   Hosie Poisson, MD  [facility] 02/19/2017, 2:22 PM Pager   If 7PM-7AM, please contact night-coverage [email or web address] Password [email or web address]

## 2019-04-12 DEATH — deceased
